# Patient Record
Sex: Female | Born: 1955 | Race: White | Hispanic: No | Smoking: Former smoker
Health system: Southern US, Community
[De-identification: ages and names within clinical notes are randomized; demographics above are authoritative.]

## PROBLEM LIST (undated history)

## (undated) DIAGNOSIS — I219 Acute myocardial infarction, unspecified: Secondary | ICD-10-CM

## (undated) DIAGNOSIS — I639 Cerebral infarction, unspecified: Secondary | ICD-10-CM

## (undated) DIAGNOSIS — J439 Emphysema, unspecified: Secondary | ICD-10-CM

## (undated) DIAGNOSIS — L821 Other seborrheic keratosis: Secondary | ICD-10-CM

## (undated) DIAGNOSIS — M8580 Other specified disorders of bone density and structure, unspecified site: Secondary | ICD-10-CM

## (undated) DIAGNOSIS — M1990 Unspecified osteoarthritis, unspecified site: Secondary | ICD-10-CM

## (undated) DIAGNOSIS — K219 Gastro-esophageal reflux disease without esophagitis: Secondary | ICD-10-CM

## (undated) DIAGNOSIS — C801 Malignant (primary) neoplasm, unspecified: Secondary | ICD-10-CM

## (undated) DIAGNOSIS — T7840XA Allergy, unspecified, initial encounter: Secondary | ICD-10-CM

## (undated) DIAGNOSIS — I209 Angina pectoris, unspecified: Secondary | ICD-10-CM

## (undated) DIAGNOSIS — H52209 Unspecified astigmatism, unspecified eye: Secondary | ICD-10-CM

## (undated) DIAGNOSIS — S82001A Unspecified fracture of right patella, initial encounter for closed fracture: Secondary | ICD-10-CM

## (undated) DIAGNOSIS — I10 Essential (primary) hypertension: Secondary | ICD-10-CM

## (undated) DIAGNOSIS — J381 Polyp of vocal cord and larynx: Secondary | ICD-10-CM

## (undated) HISTORY — PX: BREAST BIOPSY: SHX20

## (undated) HISTORY — DX: Other seborrheic keratosis: L82.1

## (undated) HISTORY — PX: SKIN BIOPSY: SHX1

## (undated) HISTORY — DX: Other specified disorders of bone density and structure, unspecified site: M85.80

## (undated) HISTORY — PX: BREAST SURGERY: SHX581

## (undated) HISTORY — PX: OTHER SURGICAL HISTORY: SHX169

## (undated) HISTORY — DX: Unspecified astigmatism, unspecified eye: H52.209

## (undated) HISTORY — DX: Unspecified fracture of right patella, initial encounter for closed fracture: S82.001A

## (undated) HISTORY — PX: TUBAL LIGATION: SHX77

## (undated) HISTORY — DX: Malignant (primary) neoplasm, unspecified: C80.1

## (undated) HISTORY — PX: CARDIAC CATHETERIZATION: SHX172

## (undated) HISTORY — DX: Emphysema, unspecified: J43.9

## (undated) HISTORY — DX: Gastro-esophageal reflux disease without esophagitis: K21.9

## (undated) HISTORY — DX: Allergy, unspecified, initial encounter: T78.40XA

## (undated) HISTORY — DX: Acute myocardial infarction, unspecified: I21.9

## (undated) SURGERY — Surgical Case
Anesthesia: *Unknown

---

## 1997-04-28 DIAGNOSIS — I219 Acute myocardial infarction, unspecified: Secondary | ICD-10-CM

## 1997-04-28 HISTORY — DX: Acute myocardial infarction, unspecified: I21.9

## 2015-01-24 ENCOUNTER — Encounter (HOSPITAL_COMMUNITY): Payer: Self-pay | Admitting: Emergency Medicine

## 2015-01-24 ENCOUNTER — Emergency Department (HOSPITAL_COMMUNITY)
Admission: EM | Admit: 2015-01-24 | Discharge: 2015-01-24 | Disposition: A | Discharge status: Home / Self Care | Payer: Self-pay | Attending: Emergency Medicine | Admitting: Emergency Medicine

## 2015-01-24 ENCOUNTER — Emergency Department (HOSPITAL_COMMUNITY): Payer: Self-pay

## 2015-01-24 DIAGNOSIS — W19XXXA Unspecified fall, initial encounter: Secondary | ICD-10-CM

## 2015-01-24 DIAGNOSIS — S4991XA Unspecified injury of right shoulder and upper arm, initial encounter: Secondary | ICD-10-CM | POA: Insufficient documentation

## 2015-01-24 DIAGNOSIS — S8001XA Contusion of right knee, initial encounter: Secondary | ICD-10-CM

## 2015-01-24 DIAGNOSIS — I10 Essential (primary) hypertension: Secondary | ICD-10-CM | POA: Insufficient documentation

## 2015-01-24 DIAGNOSIS — Y9301 Activity, walking, marching and hiking: Secondary | ICD-10-CM | POA: Insufficient documentation

## 2015-01-24 DIAGNOSIS — Y998 Other external cause status: Secondary | ICD-10-CM | POA: Insufficient documentation

## 2015-01-24 DIAGNOSIS — Y9289 Other specified places as the place of occurrence of the external cause: Secondary | ICD-10-CM | POA: Insufficient documentation

## 2015-01-24 DIAGNOSIS — W010XXA Fall on same level from slipping, tripping and stumbling without subsequent striking against object, initial encounter: Secondary | ICD-10-CM | POA: Insufficient documentation

## 2015-01-24 DIAGNOSIS — S5001XA Contusion of right elbow, initial encounter: Secondary | ICD-10-CM

## 2015-01-24 DIAGNOSIS — S6991XA Unspecified injury of right wrist, hand and finger(s), initial encounter: Secondary | ICD-10-CM | POA: Insufficient documentation

## 2015-01-24 DIAGNOSIS — Z7982 Long term (current) use of aspirin: Secondary | ICD-10-CM | POA: Insufficient documentation

## 2015-01-24 DIAGNOSIS — Z79899 Other long term (current) drug therapy: Secondary | ICD-10-CM | POA: Insufficient documentation

## 2015-01-24 DIAGNOSIS — S199XXA Unspecified injury of neck, initial encounter: Secondary | ICD-10-CM | POA: Insufficient documentation

## 2015-01-24 HISTORY — DX: Essential (primary) hypertension: I10

## 2015-01-24 MED ORDER — IBUPROFEN 200 MG PO TABS
600.0000 mg | ORAL_TABLET | Freq: Once | ORAL | Status: AC
  Administered 2015-01-24: 600 mg via ORAL
  Filled 2015-01-24: qty 3

## 2015-01-24 NOTE — ED Provider Notes (Signed)
CSN: [id number]     Arrival date & time 01/24/15  9371 History   First MD Initiated Contact with Patient 01/24/15 0756     Chief Complaint  Patient presents with  . Fall     (Consider location/radiation/quality/duration/timing/severity/associated sxs/prior Treatment) HPI  59 year old female presents after a fall yesterday. She states she was walking in her right knee "gave out". She states it felt like someone had pushed her from behind her knee. She has chronic knee issues from a patella fracture several years ago. However she states is never given out on her like this before. She fell and landed on her right side and has right shoulder pain, right elbow pain with swelling, and right lateral knee pain. Took Tylenol with some relief last night. Has been able to get up and walk. The patient denies hitting her head but her grandson, who is 2, stated that she hit her head. She does not remember this, and has no headache, nausea, vomiting, or dizziness. Swelling in her elbow has improved after ice. Denies any weakness or numbness.  Past Medical History  Diagnosis Date  . Hypertension    Past Surgical History  Procedure Laterality Date  . Caridac cath     No family history on file. Social History  Substance Use Topics  . Smoking status: None  . Smokeless tobacco: None  . Alcohol Use: None   OB History    No data available     Review of Systems  Gastrointestinal: Negative for nausea and vomiting.  Musculoskeletal: Positive for joint swelling and arthralgias.  Neurological: Negative for dizziness, syncope, weakness, numbness and headaches.  All other systems reviewed and are negative.     Allergies  Codeine  Home Medications   Prior to Admission medications   Medication Sig Start Date End Date Taking? Authorizing Provider  amLODipine (NORVASC) 10 MG tablet Take 10 mg by mouth daily.   Yes Historical Provider, MD  aspirin 325 MG tablet Take 325 mg by mouth daily.   Yes  Historical Provider, MD  losartan (COZAAR) 100 MG tablet Take 100 mg by mouth daily.   Yes Historical Provider, MD  Multiple Vitamins-Iron (MULTIVITAMIN/IRON PO) Take 1 tablet by mouth daily.   Yes Historical Provider, MD  verapamil (VERELAN PM) 180 MG 24 hr capsule Take 180 mg by mouth daily.   Yes Historical Provider, MD   BP 126/77 mmHg  Pulse 68  Temp(Src) 98.2 F (36.8 C) (Oral)  Resp 16  SpO2 100% Physical Exam  Constitutional: She is oriented to person, place, and time. She appears well-developed and well-nourished. No distress.  HENT:  Head: Normocephalic and atraumatic.  Right Ear: External ear normal.  Left Ear: External ear normal.  Nose: Nose normal.  Eyes: EOM are normal. Pupils are equal, round, and reactive to light. Right eye exhibits no discharge. Left eye exhibits no discharge.  Neck: Neck supple. Muscular tenderness present. No spinous process tenderness present.    Cardiovascular: Normal rate, regular rhythm and normal heart sounds.   Pulses:      Dorsalis pedis pulses are 2+ on the right side, and 2+ on the left side.  Pulmonary/Chest: Effort normal and breath sounds normal.  Abdominal: Soft. She exhibits no distension. There is no tenderness.  Musculoskeletal:       Right shoulder: She exhibits tenderness. She exhibits normal range of motion.       Right elbow: She exhibits swelling. She exhibits normal range of motion. Tenderness found.  Right wrist: She exhibits normal range of motion and no tenderness.       Right knee: She exhibits ecchymosis (mild lateral ecchymosis). She exhibits normal range of motion. Tenderness found. Lateral joint line tenderness noted.       Right upper arm: She exhibits no tenderness.       Right forearm: She exhibits no tenderness.       Right hand: She exhibits no tenderness.       Right upper leg: She exhibits no tenderness.       Right lower leg: She exhibits no tenderness.  Neurological: She is alert and oriented to  person, place, and time.  CN 2-12 grossly intact. 5/5 strength in all 4 extremities. Grossly normal sensation.  Skin: Skin is warm and dry. She is not diaphoretic.  Nursing note and vitals reviewed.   ED Course  Procedures (including critical care time) Labs Review Labs Reviewed - No data to display  Imaging Review Dg Shoulder Right  01/24/2015   CLINICAL DATA:  Status post fall 01/24/2015 with a right shoulder injury. Pain. Initial encounter.  EXAM: RIGHT SHOULDER - 2+ VIEW  COMPARISON:  None.  FINDINGS: No acute bony or joint abnormality is identified. Moderate acromioclavicular degenerative change is seen. Image right lung and ribs are unremarkable.  IMPRESSION: No acute finding.  Acromioclavicular osteoarthritis.   Electronically Signed   By: Inge Rise M.D.   On: 01/24/2015 08:55   Dg Elbow Complete Right  01/24/2015   CLINICAL DATA:  Status post fall.  Right elbow pain.  EXAM: RIGHT ELBOW - COMPLETE 3+ VIEW  COMPARISON:  None.  FINDINGS: There is no evidence of fracture, dislocation, or joint effusion. There is no evidence of arthropathy or other focal bone abnormality. Soft tissues are unremarkable.  IMPRESSION: No acute osseous injury of the right elbow.   Electronically Signed   By: Kathreen Devoid   On: 01/24/2015 08:55   Dg Knee Complete 4 Views Right  01/24/2015   CLINICAL DATA:  Status post fall following right knee giving out yesterday, persistent right lateral knee pain.  EXAM: RIGHT KNEE - COMPLETE 4+ VIEW  COMPARISON:  None in PACs  FINDINGS: The bones are adequately mineralized for age. The joint spaces are reasonably well-maintained. There is beaking of the tibial spines. There are spurs arising from the periphery of the lateral tibial plateau and lateral femoral condyles. The proximal fibula is intact. The patella is appropriately positioned. There is no joint effusion.  IMPRESSION: There is no acute bony abnormality of the right knee. There are mild osteoarthritic changes  present.   Electronically Signed   By: David  Martinique M.D.   On: 01/24/2015 08:56   I have personally reviewed and evaluated these images and lab results as part of my medical decision-making.   EKG Interpretation None      MDM   Final diagnoses:  Fall, initial encounter  Knee contusion, right, initial encounter  Elbow contusion, right, initial encounter    X-rays are unremarkable, no fractures noted. Mild contusions noted but otherwise patient's exam is normal. No signs of a significant head injury with a normal neuro exam and did not feel CT imaging is warranted even though she may have hit her head. Unclear why her knee gave out but she has normal strength, sensation, and normal pulses in both extremities. Plan to treat with ibuprofen, Tylenol, ice, and will refer to a PCP.    Sherwood Gambler, MD 01/24/15 [phone]

## 2015-01-24 NOTE — ED Notes (Addendum)
Pt reported that rt knee "gave out" and she fell yesterday. Pt reported that she does not remember hitting her head or LOC but 59yo grandson reported that she hit her head. Pt c/o rt shoulder, rt lateral knee and rt elbow pain s/p to fall. Denies dizziness, visual disturbances, headaches, and n/v. Rt arm with (+)PMS, CRT brisk, no deformity/swelling/bruising noted, LROM to rt shoulder but not elbow.  Rt leg with (+)PMS, CRT brisk, no deformity/swelling/bruising noted, ROM and able to bear weight.

## 2015-01-24 NOTE — ED Notes (Addendum)
Per pt, states she tripped and fell yesterday on her right side-c/o right leg, shoulder, and elbow pain

## 2015-01-24 NOTE — ED Notes (Signed)
Patient transported back from X-ray 

## 2015-01-24 NOTE — Discharge Instructions (Signed)
°Emergency Department Resource Guide °1) Find a Doctor and Pay Out of Pocket °Although you won't have to find out who is covered by your insurance plan, it is a good idea to ask around and get recommendations. You will then need to call the office and see if the doctor you have chosen will accept you as a new patient and what types of options they offer for patients who are self-pay. Some doctors offer discounts or will set up payment plans for their patients who do not have insurance, but you will need to ask so you aren't surprised when you get to your appointment. ° °2) Contact Your Local Health Department °Not all health departments have doctors that can see patients for sick visits, but many do, so it is worth a call to see if yours does. If you don't know where your local health department is, you can check in your phone book. The CDC also has a tool to help you locate your state's health department, and many state websites also have listings of all of their local health departments. ° °3) Find a Walk-in Clinic °If your illness is not likely to be very severe or complicated, you may want to try a walk in clinic. These are popping up all over the country in pharmacies, drugstores, and shopping centers. They're usually staffed by nurse practitioners or physician assistants that have been trained to treat common illnesses and complaints. They're usually fairly quick and inexpensive. However, if you have serious medical issues or chronic medical problems, these are probably not your best option. ° °No Primary Care Doctor: °- Call [facility] at  832-8000 - they can help you locate a primary care doctor that  accepts your insurance, provides certain services, etc. °- Physician Referral Service- 1-800-533-3463 ° °Chronic Pain Problems: °Organization         Address  Phone   Notes  °[facility]  (336) 297-2271 Patients need to be referred by their primary care doctor.  ° °Medication  Assistance: °Organization         Address  Phone   Notes  °[place] [facility] [address] °[place], [place] [address] (336) 641-8030 --Must be a resident of [place] °-- Must have NO insurance coverage whatsoever (no Medicaid/ Medicare, etc.) °-- The pt. MUST have a primary care doctor that directs their care regularly and follows them in the community °  °[facility]  (866) 331-1348   °[employer]  (888) 892-1162   ° °Agencies that provide inexpensive medical care: °Organization         Address  Phone   Notes  °[facility]  (336) 832-8035   °[facility]    (336) 832-7272   °[facility] [address] °[place], [place] [address] (336) 832-4777   °[facility] [address], °[place] (336) 271-4999   °[facility]    (336) 373-0678   °[facility]    (336) 272-1050   °[facility] ° [address], [place] Phone:  (336) 832-4444, Fax:  (336) 832-4440 Hours of Operation:  9 am - 6 pm, M-F.  Also accepts Medicaid/Medicare and self-pay.  °[facility] ° [address], [place] Phone: (336) 832-3150, Fax: (336) 832-3151. Hours of Operation:  8:30 am - 5:30 pm, M-F.  Also accepts Medicaid and self-pay.  °[facility] [place] [address], [place] Phone: (336) 878-6027   °[facility] [address], [place], [place] (336)723-1848, Ext. 123 Mondays & Thursdays: 7-9 AM.  First 15 patients are seen on a first come, first serve basis. °  ° °Medicaid-accepting [place] Providers: ° °Organization         Address  Phone   Notes  °[facility] [address], [place] (336) 641-2100 Also accepts self-pay patients.  °[facility] [address], [place] ° (336) 856-9996   °[facility] [address], [place]  (336) 288-8857   °[facility] [address] [place] [address], [place] (336) 299-7000   °Veita Bland [address], [place]  ° (336) 373-1557 Only accepts [place] Access Medicaid patients after they have their name applied to their card.  ° °Self-Pay (no insurance) in [place]: ° °Organization         Address  Phone   Notes  °Sickle Cell Patients, [facility] [address], [place] (336) 832-1970   °[facility] [address], [place] (336) 832-4400   ° [facility] ° [address], [place] (336) 992-4800   °[facility]/Dr. Osei-Bonsu ° [address] [place] [address], [place] or [address], [place] (336) [phone] Phone number for both [place] and [place] locations is the same.  °[facility] [address], [place] (336) 299-0000   °[facility] [address] [place] [address], [place] or [address] (336) 852-7530 °(336) 878-2260   °[facility] [address], [place] (336) 350-1642, phone; (336) 294-5005, fax Sees patients 1st and 3rd Saturday of every month.  Must not qualify for public or private insurance (i.e. Medicaid, Medicare, [place] Health Choice, Veterans' Benefits) • Household income should be no more than 200% of the poverty level •The clinic cannot treat you if you are pregnant or think you are pregnant • Sexually transmitted diseases are not treated at the clinic.  ° ° °Dental Care: °Organization         Address  Phone  Notes  °[facility] [facility] [address], [place] (336) 641-6152 Accepts children up to age 21 who are enrolled in Medicaid or [place] Health Choice; pregnant women with a Medicaid card; and children who have applied for Medicaid or [place] Health Choice, but were declined, whose parents can pay a reduced fee at time of service.  °[place]  [facility] [place]  [address], [place] (336) 641-7733 Accepts children up to age 21 who are enrolled in Medicaid or  Health Choice; pregnant women with a Medicaid card; and children who have applied for Medicaid or  Health Choice, but were declined, whose parents can pay a reduced fee at time of service.  °[facility] ° [address],  (336) 641-4533 Patients are seen by appointment only. Walk-ins are not accepted. [facility] will see patients 18 years of age and older. °Monday - Tuesday (8am-5pm) °Most Wednesdays (8:30-5pm) °$30 per visit, cash only  °[facility] ° [address], [place] (336) 641-4533 Patients are seen by appointment only. Walk-ins are not accepted. [facility] will see patients 18 years of age and older. °One   Wednesday Evening (Monthly: Volunteer Based).  $30 per visit, cash only  °[facility]  (919) 537-3737 for adults; Children under age 4, call Graduate Pediatric Dentistry at ([phone]) [phone]. Children aged 4-14, please call (919) [phone] to request a pediatric application. ° Dental services are provided in all areas of dental care including fillings, crowns and bridges, complete and partial dentures, implants, gum treatment, root canals, and extractions. Preventive care is also provided. Treatment is provided to both adults and children. °Patients are selected via a lottery and there is often a waiting list. °  °[facility] [address], °[place] ° (336) 763-8833 [email or web address] °  °[facility] [address], [place], [place] (336)723-1848, Ext. 123 Second and Fourth Thursday of each month, opens at 6:30 AM; Clinic ends at 9 AM.  Patients are seen on a first-come first-served basis, and a limited number are seen during each clinic.  ° °[facility] ° [address], [place], [place] (336) 723-7904    Eligibility Requirements °You must have lived in [place], [place], or [place] counties for at least the last three months. °  You cannot be eligible for state or federal sponsored healthcare insurance, including Veterans Administration, Medicaid, or Medicare. °  You generally cannot be eligible for healthcare insurance through your employer.  °  How to apply: °Eligibility screenings are held every Tuesday and Wednesday afternoon from 1:00 pm until 4:00 pm. You do not need an appointment for the interview!  °[facility] [address], [place], [place] 336-631-2330   °[facility]  336-342-8273   °[facility]  336-703-3100   °[facility]  336-570-6415   ° °Behavioral Health Resources in the Community: °Intensive Outpatient Programs °Organization         Address  Phone  Notes  °[facility] [address], [place], [place] 336-878-6098   °[facility] [address], [place], [place] 336-832-9800   °[facility] [address], [place], [place] ° 336-882-2125   °[facility] [address],  °[place], [place] 1-800-853-5163 or 336-641-4981   °Substance Abuse Resources °Organization         Address  Phone  Notes  °[facility]  336-882-2125   °[facility]  336-784-9470   °[facility]  336-285-9073   °[facility]  336-845-3988   °Residential & Outpatient Substance Abuse Program  1-800-659-3381   °Psychological Services °Organization         Address  Phone  Notes  °[facility]  336- 832-9600   °[facility]  336- 378-7881   °[facility] [address], [place] 1-800-853-5163 or 336-641-4981   ° °Mobile Crisis Teams °Organization         Address  Phone  Notes  °[facility], Mobile Crisis Care Unit  1-877-626-1772   °Assertive °[facility] ° [address]  [place], [place] 336-834-9664   °Sharon DeEsch [address] °[place]  336-554-5454   ° °Self-Help/Support Groups °Organization         Address  Phone             Notes  °[facility] - variety of support groups  336- 373-1402 Call for more information  °Narcotics Anonymous (NA), [facility] [address], °[place]   2 meetings at this location  ° °  Residential Treatment Programs °Organization         Address  Phone  Notes  °[facility] [address],    °[place]  1-866-801-8205   °[facility] ° [address], [place], [place] 704-293-8524   °[facility] [address], [place] 336-845-3988 Admissions: 8am-3pm M-F  °[facility] [address]    °[place], [place] 336-841-1104   °[facility] [address], [place], [place] 336-379-7146   °[facility] [address]  °[place], [place] 336-285-9073   °[facility] - Intensive Outpatient [address], [place], [place] 336-852-3033   °[facility] [address]  °[place], [place] 1-877-615-2722 or 336-784-9470   °[facility] [address] [place], [place] 336-227-7417 Accepts Medicaid  °[facility] [address]  °[place] 1-800-659-3381 Substance Abuse/Addiction Treatment  ° °[place] Behavioral Health Resources °Organization         Address  Phone  Notes  °[facility]  (888) 581-9988   °Julie Brannon, PhD [address] [place], [place]   (336) 349-5553 or (336) 951-0000   °[facility]   [address] °[place], [place] (336) 349-4454   °[facility] [address], [place], [place] (336) 342-8316 Insurance/Medicaid/sponsorship through [facility]  °[facility] [address]                                    [place], [place] (336) 342-8316 Therapy/tele-psych/case    °[facility] [address]  ° [place], [place] (336) 349-2233    °Dr. Arfeen  (336) 349-4544   °[facility] [place]  [employer] [place] [facility] 1) [address], [place] °2) [address], [place] °3)  [address], [place] (336) 349-3220 °(336) 342-7768 ° °(336) 342-8140   °[place] [facility] (336) 342-1394 or (336) 342-3537 (After Hours)    ° ° °

## 2015-04-26 ENCOUNTER — Emergency Department (HOSPITAL_COMMUNITY)
Admission: EM | Admit: 2015-04-26 | Discharge: 2015-04-26 | Disposition: A | Discharge status: Home / Self Care | Payer: Self-pay | Attending: Emergency Medicine | Admitting: Emergency Medicine

## 2015-04-26 ENCOUNTER — Encounter (HOSPITAL_COMMUNITY): Payer: Self-pay | Admitting: Emergency Medicine

## 2015-04-26 ENCOUNTER — Emergency Department (HOSPITAL_COMMUNITY): Payer: Self-pay

## 2015-04-26 DIAGNOSIS — I10 Essential (primary) hypertension: Secondary | ICD-10-CM | POA: Insufficient documentation

## 2015-04-26 DIAGNOSIS — Z7982 Long term (current) use of aspirin: Secondary | ICD-10-CM | POA: Insufficient documentation

## 2015-04-26 DIAGNOSIS — M5410 Radiculopathy, site unspecified: Secondary | ICD-10-CM | POA: Insufficient documentation

## 2015-04-26 DIAGNOSIS — F17210 Nicotine dependence, cigarettes, uncomplicated: Secondary | ICD-10-CM | POA: Insufficient documentation

## 2015-04-26 DIAGNOSIS — Z9889 Other specified postprocedural states: Secondary | ICD-10-CM | POA: Insufficient documentation

## 2015-04-26 DIAGNOSIS — I252 Old myocardial infarction: Secondary | ICD-10-CM | POA: Insufficient documentation

## 2015-04-26 DIAGNOSIS — Z79899 Other long term (current) drug therapy: Secondary | ICD-10-CM | POA: Insufficient documentation

## 2015-04-26 DIAGNOSIS — M792 Neuralgia and neuritis, unspecified: Secondary | ICD-10-CM

## 2015-04-26 DIAGNOSIS — R51 Headache: Secondary | ICD-10-CM | POA: Insufficient documentation

## 2015-04-26 DIAGNOSIS — R079 Chest pain, unspecified: Secondary | ICD-10-CM | POA: Insufficient documentation

## 2015-04-26 LAB — I-STAT TROPONIN, ED: Troponin i, poc: 0 ng/mL (ref 0.00–0.08)

## 2015-04-26 MED ORDER — KETOROLAC TROMETHAMINE 15 MG/ML IJ SOLN
15.0000 mg | Freq: Once | INTRAMUSCULAR | Status: AC
  Administered 2015-04-26: 15 mg via INTRAVENOUS
  Filled 2015-04-26: qty 1

## 2015-04-26 MED ORDER — PREDNISONE 10 MG PO TABS: ORAL_TABLET | ORAL | Status: DC

## 2015-04-26 MED ORDER — NAPROXEN 500 MG PO TABS: 500.0000 mg | ORAL_TABLET | Freq: Two times a day (BID) | ORAL | Status: DC

## 2015-04-26 NOTE — ED Notes (Signed)
Pt. In xray 

## 2015-04-26 NOTE — ED Notes (Signed)
Patient presents for left arm pain, radiating to shoulder, headache x2-3 days. Denies N/V, diaphoresis or visual changes. Rates pain 8/10.

## 2015-04-26 NOTE — ED Provider Notes (Signed)
CSN: [id number]     Arrival date & time 04/26/15  1839 History   First MD Initiated Contact with Patient 04/26/15 2106     Chief Complaint  Patient presents with  . Headache  . Arm Pain     (Consider location/radiation/quality/duration/timing/severity/associated sxs/prior Treatment) HPI Comments: Patient with several day history of occipital headache for 2-3 days, radiating towards scalp. Today developed left shoulder pain radiating into left arm. Pain in arm worse with movement.  Also had chest tightness around noon today that has resolved.  No prior neck injury.    Patient is a 59 y.o. female presenting with headaches and arm pain. The history is provided by the patient. No language interpreter was used.  Headache Pain location:  Occipital Quality:  Dull Radiates to:  L neck Severity currently:  8/10 Onset quality:  Gradual Duration:  3 days Timing:  Intermittent Progression:  Waxing and waning Chronicity:  New Similar to prior headaches: no   Associated symptoms: neck pain   Associated symptoms: no neck stiffness   Associated symptoms comment:  Left shoulder and arm pain Arm Pain Associated symptoms include headaches and neck pain. Pertinent negatives include no chest pain.    Past Medical History  Diagnosis Date  . Hypertension   . MI (myocardial infarction) [facility])    Past Surgical History  Procedure Laterality Date  . Caridac cath     No family history on file. Social History  Substance Use Topics  . Smoking status: Current Every Day Smoker -- 0.50 packs/day    Types: Cigarettes  . Smokeless tobacco: None  . Alcohol Use: Yes   OB History    No data available     Review of Systems  Respiratory: Positive for chest tightness.   Cardiovascular: Negative for chest pain.  Musculoskeletal: Positive for neck pain. Negative for neck stiffness.  Neurological: Positive for headaches.  All other systems reviewed and are negative.     Allergies  Codeine  Home  Medications   Prior to Admission medications   Medication Sig Start Date End Date Taking? Authorizing Provider  amLODipine (NORVASC) 10 MG tablet Take 10 mg by mouth daily.    Historical Provider, MD  aspirin 325 MG tablet Take 325 mg by mouth daily.    Historical Provider, MD  losartan (COZAAR) 100 MG tablet Take 100 mg by mouth daily.    Historical Provider, MD  Multiple Vitamins-Iron (MULTIVITAMIN/IRON PO) Take 1 tablet by mouth daily.    Historical Provider, MD  verapamil (VERELAN PM) 180 MG 24 hr capsule Take 180 mg by mouth daily.    Historical Provider, MD   BP 137/76 mmHg  Pulse 86  Temp(Src) 98.2 F (36.8 C) (Oral)  Resp 21  SpO2 95% Physical Exam  Constitutional: She is oriented to person, place, and time. She appears well-developed and well-nourished.  HENT:  Head: Normocephalic.  Eyes: Conjunctivae are normal.  Neck: Normal range of motion. Neck supple. Muscular tenderness present. No spinous process tenderness present. No rigidity.  Cardiovascular: Normal rate and regular rhythm.   Pulmonary/Chest: Effort normal and breath sounds normal.  Abdominal: Soft. Bowel sounds are normal.  Musculoskeletal: She exhibits tenderness. She exhibits no edema.  Neurological: She is alert and oriented to person, place, and time.  Skin: Skin is warm and dry.  Psychiatric: She has a normal mood and affect.  Nursing note and vitals reviewed.   ED Course  Procedures (including critical care time) Labs Review Labs Reviewed  Randolm Idol, ED  Imaging Review Ct Head Wo Contrast  04/26/2015  CLINICAL DATA:  LEFT arm pain radiating to shoulder in headache for 2-3 days. History of smoker, hypertension and myocardial infarction. EXAM: CT HEAD WITHOUT CONTRAST CT CERVICAL SPINE WITHOUT CONTRAST TECHNIQUE: Multidetector CT imaging of the head and cervical spine was performed following the standard protocol without intravenous contrast. Multiplanar CT image reconstructions of the  cervical spine were also generated. COMPARISON:  None. FINDINGS: CT HEAD FINDINGS The ventricles and sulci are normal. No intraparenchymal hemorrhage, mass effect nor midline shift. No acute large vascular territory infarcts. Mild patchy supratentorial white matter hypodensities most compatible with chronic small vessel ischemic disease. No abnormal extra-axial fluid collections. Basal cisterns are patent. Mild calcific atherosclerosis of the carotid siphons and vertebral arteries. No skull fracture. The included ocular globes and orbital contents are non-suspicious. The mastoid aircells and included paranasal sinuses are well-aerated. CT CERVICAL SPINE FINDINGS Cervical vertebral bodies and posterior elements intact. Straightened cervical lordosis. Moderate C3-4 disc height loss, moderate to severe at C5-6 and C6-7 with uncovertebral hypertrophy/endplate spurring compatible with degenerative discs. C2 subchondral cyst. Multilevel moderate to severe facet arthropathy. C1-2 articulation maintained with moderate arthropathy. No destructive bony lesions. Moderate calcific atherosclerosis the carotid bulbs can result in hemodynamically significant stenosis. Mild canal stenosis at C5-6. Severe LEFT C3-4, moderate to severe bilateral C5-6 and severe bilateral C6-7 neural foraminal narrowing. IMPRESSION: CT HEAD: No acute intracranial process. Mild chronic small vessel ischemic disease. CT CERVICAL SPINE: Straightened cervical lordosis without acute fracture nor malalignment. Degenerative cervical spine resulting in mild canal stenosis at C5-6. Severe LEFT C3-4, moderate to severe bilateral C5-6 and severe bilateral C6-7 neural foraminal narrowing. Electronically Signed   By: Elon Alas M.D.   On: 04/26/2015 22:38   Ct Cervical Spine Wo Contrast  04/26/2015  CLINICAL DATA:  LEFT arm pain radiating to shoulder in headache for 2-3 days. History of smoker, hypertension and myocardial infarction. EXAM: CT HEAD  WITHOUT CONTRAST CT CERVICAL SPINE WITHOUT CONTRAST TECHNIQUE: Multidetector CT imaging of the head and cervical spine was performed following the standard protocol without intravenous contrast. Multiplanar CT image reconstructions of the cervical spine were also generated. COMPARISON:  None. FINDINGS: CT HEAD FINDINGS The ventricles and sulci are normal. No intraparenchymal hemorrhage, mass effect nor midline shift. No acute large vascular territory infarcts. Mild patchy supratentorial white matter hypodensities most compatible with chronic small vessel ischemic disease. No abnormal extra-axial fluid collections. Basal cisterns are patent. Mild calcific atherosclerosis of the carotid siphons and vertebral arteries. No skull fracture. The included ocular globes and orbital contents are non-suspicious. The mastoid aircells and included paranasal sinuses are well-aerated. CT CERVICAL SPINE FINDINGS Cervical vertebral bodies and posterior elements intact. Straightened cervical lordosis. Moderate C3-4 disc height loss, moderate to severe at C5-6 and C6-7 with uncovertebral hypertrophy/endplate spurring compatible with degenerative discs. C2 subchondral cyst. Multilevel moderate to severe facet arthropathy. C1-2 articulation maintained with moderate arthropathy. No destructive bony lesions. Moderate calcific atherosclerosis the carotid bulbs can result in hemodynamically significant stenosis. Mild canal stenosis at C5-6. Severe LEFT C3-4, moderate to severe bilateral C5-6 and severe bilateral C6-7 neural foraminal narrowing. IMPRESSION: CT HEAD: No acute intracranial process. Mild chronic small vessel ischemic disease. CT CERVICAL SPINE: Straightened cervical lordosis without acute fracture nor malalignment. Degenerative cervical spine resulting in mild canal stenosis at C5-6. Severe LEFT C3-4, moderate to severe bilateral C5-6 and severe bilateral C6-7 neural foraminal narrowing. Electronically Signed   By: Elon Alas M.D.   On:  04/26/2015 22:38   I have personally reviewed and evaluated these images and lab results as part of my medical decision-making.   EKG Interpretation None     Radiology results reviewed and shared with patient. Pain likely radicular in nature. Will treat with anti-inflammatory and short course of steroids. Sling for arm support. Recommend follow-up with neurosurgery. MDM   Final diagnoses:  None    Radicular pain. Care instructions provided. Return precautions discussed.    Etta Quill, NP 04/26/15 [id number]  Varney Biles, MD 04/26/15 2348

## 2015-04-26 NOTE — Progress Notes (Signed)
EDCM spoke to patient at bedside. Patient confirms she does not have a pcp or insurance living in [place].  [facility] provided patient with contact infromation to [facility], informed patient of services there.  EDCM also provided patient with list of pcps who accept self pay patients, list of discount pharmacies and websites needymeds.org and GoodRX.com for medication assistance, phone number to inquire about the orange card, phone number to inquire about [employer], phone number to inquire about the [place], financial resources in the community such as local churches, [employer], urban ministries, and dental assistance for uninsured patients.  Patient thankful for resources.  No further EDCM needs at this time.  Patient reports she has applied for Medicaid.  Patient reports her son and daughter in law pay for her medications.

## 2015-04-26 NOTE — Discharge Instructions (Signed)
Radicular Pain °Radicular pain in either the arm or leg is usually from a bulging or herniated disk in the spine. A piece of the herniated disk may press against the nerves as the nerves exit the spine. This causes pain which is felt at the tips of the nerves down the arm or leg. Other causes of radicular pain may include: °· Fractures. °· Heart disease. °· Cancer. °· An abnormal and usually degenerative state of the nervous system or nerves (neuropathy). °Diagnosis may require CT or MRI scanning to determine the primary cause.  °Nerves that start at the neck (nerve roots) may cause radicular pain in the outer shoulder and arm. It can spread down to the thumb and fingers. The symptoms vary depending on which nerve root has been affected. In most cases radicular pain improves with conservative treatment. Neck problems may require physical therapy, a neck collar, or cervical traction. Treatment may take many weeks, and surgery may be considered if the symptoms do not improve.  °Conservative treatment is also recommended for sciatica. Sciatica causes pain to radiate from the lower back or buttock area down the leg into the foot. Often there is a history of back problems. Most patients with sciatica are better after 2 to 4 weeks of rest and other supportive care. Short term bed rest can reduce the disk pressure considerably. Sitting, however, is not a good position since this increases the pressure on the disk. You should avoid bending, lifting, and all other activities which make the problem worse. Traction can be used in severe cases. Surgery is usually reserved for patients who do not improve within the first months of treatment. °Only take over-the-counter or prescription medicines for pain, discomfort, or fever as directed by your caregiver. Narcotics and muscle relaxants may help by relieving more severe pain and spasm and by providing mild sedation. Cold or massage can give significant relief. Spinal manipulation  is not recommended. It can increase the degree of disc protrusion. Epidural steroid injections are often effective treatment for radicular pain. These injections deliver medicine to the spinal nerve in the space between the protective covering of the spinal cord and back bones (vertebrae). Your caregiver can give you more information about steroid injections. These injections are most effective when given within two weeks of the onset of pain.  °You should see your caregiver for follow up care as recommended. A program for neck and back injury rehabilitation with stretching and strengthening exercises is an important part of management.  °SEEK IMMEDIATE MEDICAL CARE IF: °· You develop increased pain, weakness, or numbness in your arm or leg. °· You develop difficulty with bladder or bowel control. °· You develop abdominal pain. °  °This information is not intended to replace advice given to you by your health care provider. Make sure you discuss any questions you have with your health care provider. °  °Document Released: 05/22/2004 Document Revised: 05/05/2014 Document Reviewed: 11/08/2014 °Elsevier Interactive Patient Education ©2016 [employer]vier Inc. ° °

## 2016-04-28 DIAGNOSIS — D229 Melanocytic nevi, unspecified: Secondary | ICD-10-CM

## 2016-04-28 HISTORY — DX: Melanocytic nevi, unspecified: D22.9

## 2016-05-26 ENCOUNTER — Ambulatory Visit (INDEPENDENT_AMBULATORY_CARE_PROVIDER_SITE_OTHER): Payer: BLUE CROSS/BLUE SHIELD | Admitting: Adult Health

## 2016-05-26 ENCOUNTER — Telehealth: Payer: Self-pay | Admitting: Adult Health

## 2016-05-26 ENCOUNTER — Encounter: Payer: Self-pay | Admitting: Adult Health

## 2016-05-26 VITALS — BP 122/78 | HR 79 | Ht 61.0 in | Wt 131.1 lb

## 2016-05-26 DIAGNOSIS — I252 Old myocardial infarction: Secondary | ICD-10-CM

## 2016-05-26 DIAGNOSIS — R200 Anesthesia of skin: Secondary | ICD-10-CM

## 2016-05-26 DIAGNOSIS — E784 Other hyperlipidemia: Secondary | ICD-10-CM | POA: Diagnosis not present

## 2016-05-26 DIAGNOSIS — Z833 Family history of diabetes mellitus: Secondary | ICD-10-CM | POA: Diagnosis not present

## 2016-05-26 DIAGNOSIS — F17200 Nicotine dependence, unspecified, uncomplicated: Secondary | ICD-10-CM | POA: Insufficient documentation

## 2016-05-26 DIAGNOSIS — E1159 Type 2 diabetes mellitus with other circulatory complications: Secondary | ICD-10-CM | POA: Diagnosis not present

## 2016-05-26 DIAGNOSIS — M25511 Pain in right shoulder: Secondary | ICD-10-CM

## 2016-05-26 DIAGNOSIS — I10 Essential (primary) hypertension: Secondary | ICD-10-CM

## 2016-05-26 DIAGNOSIS — E7849 Other hyperlipidemia: Secondary | ICD-10-CM | POA: Insufficient documentation

## 2016-05-26 DIAGNOSIS — R202 Paresthesia of skin: Secondary | ICD-10-CM

## 2016-05-26 DIAGNOSIS — R0609 Other forms of dyspnea: Secondary | ICD-10-CM

## 2016-05-26 DIAGNOSIS — R5383 Other fatigue: Secondary | ICD-10-CM | POA: Diagnosis not present

## 2016-05-26 DIAGNOSIS — D4989 Neoplasm of unspecified behavior of other specified sites: Secondary | ICD-10-CM

## 2016-05-26 DIAGNOSIS — G8929 Other chronic pain: Secondary | ICD-10-CM | POA: Insufficient documentation

## 2016-05-26 DIAGNOSIS — Z8673 Personal history of transient ischemic attack (TIA), and cerebral infarction without residual deficits: Secondary | ICD-10-CM | POA: Insufficient documentation

## 2016-05-26 DIAGNOSIS — R0602 Shortness of breath: Secondary | ICD-10-CM

## 2016-05-26 DIAGNOSIS — M25561 Pain in right knee: Secondary | ICD-10-CM

## 2016-05-26 MED ORDER — LOSARTAN POTASSIUM 100 MG PO TABS
100.0000 mg | ORAL_TABLET | Freq: Every day | ORAL | 0 refills | Status: DC
Start: 2016-05-26 — End: 2016-11-04

## 2016-05-26 MED ORDER — VERAPAMIL HCL ER 180 MG PO CP24: 180.0000 mg | ORAL_CAPSULE | Freq: Every day | ORAL | 0 refills | Status: DC

## 2016-05-26 MED ORDER — AMLODIPINE BESYLATE 10 MG PO TABS: 10.0000 mg | ORAL_TABLET | Freq: Every day | ORAL | 0 refills | Status: DC

## 2016-05-26 NOTE — Assessment & Plan Note (Signed)
Will monitor

## 2016-05-26 NOTE — Assessment & Plan Note (Signed)
Continue anti-hypertensives as directed.  Reduce tobacco use.

## 2016-05-26 NOTE — Assessment & Plan Note (Signed)
Continue to reduce tobacco use.  Will discuss smoking cessation at next encounter.

## 2016-05-26 NOTE — Assessment & Plan Note (Signed)
Continue current medications.  Continue to avoid sodium and saturated fat.

## 2016-05-26 NOTE — Assessment & Plan Note (Signed)
Avoid sugar and large amounts of CHO intake.

## 2016-05-26 NOTE — Progress Notes (Signed)
Subjective:    Patient ID: Heidi Foster, female    DOB: 1955-07-19, 61 y.o.   MRN: [id number]  HPI:  [name] presents to establish as new pt.  She has significant cardia/neurologic hx, and various musculoskeletal issues: 1999-MI:  PCA without stents, treated with medication. 2006-TIA:  Treated with medication.   She has not seen specialists in years and last contact with PCP was > 3 years ago. She denies current angina/palptiations. She has 30 pack year hx and experiences SOB everyday. She underwent extensive pulmonary testing last week for Disability benefits.  She is interested in smoking cessation.   She has chronic right knee pain, 8/10 localized over lateral knee.  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  X-rays revealed fx and Baker's Cyst.  She was treated with NSAIDs, various braces, and 2 cortisone injections.  Last contact with Orthopedic specialist was 2012.   She fell 3 weeks ago and suffered right shoulder injury, "while catching myself".  Currently 4/10, treating with OTC Salon Paws. Left foot toes/left hand fingers-intermittent numbness/tingling.  She denies previous neck/back injury prior to onset of sx's.  Reports "brown spot on nose has gotten really big over the last 3 years".   She is no working and lives at home with several family members.    No care team member to display  Patient Active Problem List   Diagnosis Date Noted  . Other fatigue 05/26/2016  . 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 Other hyperlipidemia 05/26/2016  . Family history of diabetes mellitus in mother 05/26/2016     Past Medical History:  Diagnosis Date  . Allergy   . Closed fracture of right patella   . COPD (chronic obstructive pulmonary disease) ([place])   . Heart attack 1999  . Hypertension   . MI (myocardial infarction)      Past Surgical History:  Procedure Laterality Date  . caridac cath       Family History  Problem Relation  Age of Onset  . Diabetes Mother   . Hypertension Mother   . Stroke Mother   . Alcohol abuse Father   . Multiple sclerosis Sister   . Cancer Brother     lung     History  Drug Use No     History  Alcohol Use No     History  Smoking Status  . Current Every Day Smoker  . Packs/day: 0.50  . Years: 40.00  . Types: Cigarettes  Smokeless Tobacco  . Never Used     Outpatient Encounter Prescriptions as of 05/26/2016  Medication Sig  . amLODipine (NORVASC) 10 MG tablet Take 1 tablet (10 mg total) by mouth daily.  Marland Kitchen aspirin EC 81 MG tablet Take 81 mg by mouth daily.  Marland Kitchen losartan (COZAAR) 100 MG tablet Take 1 tablet (100 mg total) by mouth daily.  . Multiple Vitamin (MULTIVITAMIN) tablet Take 1 tablet by mouth daily.  . verapamil (VERELAN PM) 180 MG 24 hr capsule Take 1 capsule (180 mg total) by mouth daily.  . [DISCONTINUED] amLODipine (NORVASC) 10 MG tablet Take 10 mg by mouth daily.  . [DISCONTINUED] losartan (COZAAR) 100 MG tablet Take 100 mg by mouth daily.  . [DISCONTINUED] verapamil (VERELAN PM) 180 MG 24 hr capsule Take 180 mg by mouth daily.  . [DISCONTINUED] naproxen (NAPROSYN) 500 MG tablet Take 1 tablet (500 mg total) by mouth 2 (two) times daily.  . [DISCONTINUED] predniSONE (DELTASONE) 10 MG  tablet Take 6 tabs day 1, 5 tabs day 2, 4 tabs day 3, 3 tabs day 4, 2 tabs day 5, 1 tab day 6.   No facility-administered encounter medications on file as of 05/26/2016.     Allergies: Codeine  Body mass index is 24.77 kg/m.  Blood pressure 122/78, pulse 79, height 5\' 1"  (1.549 m), weight 131 lb 1.6 oz (59.5 kg).     Review of Systems  Constitutional: Positive for fatigue. Negative for activity change, appetite change, chills, diaphoresis, fever and unexpected weight change.  HENT: Positive for trouble swallowing and voice change. Negative for congestion, postnasal drip and sore throat.        Reports hoarse voice "for as long as I can remember".  Nose-"large brown  spot that has gotten very big over the last couple of years".  Eyes: Positive for visual disturbance.       Hx of "floaters"...she doe not drive anymore due to this condition.  Respiratory: Positive for cough and shortness of breath. Negative for choking, chest tightness, wheezing and stridor.   Cardiovascular: Negative for chest pain, palpitations and leg swelling.  Gastrointestinal: Negative for abdominal pain, blood in stool, constipation and diarrhea.  Endocrine: Negative for cold intolerance, heat intolerance, polydipsia, polyphagia and polyuria.  Genitourinary: Negative for difficulty urinating.  Musculoskeletal: Positive for arthralgias, back pain, gait problem, joint swelling and myalgias. Negative for neck pain and neck stiffness.  Skin: Negative for color change, pallor, rash and wound.  Neurological: Negative for dizziness, tremors, seizures, syncope, speech difficulty, weakness, light-headedness and headaches.  Hematological: Does not bruise/bleed easily.  Psychiatric/Behavioral: Negative for agitation, behavioral problems and confusion. The patient is not nervous/anxious.        Objective:   Physical Exam  Constitutional: She appears well-developed and well-nourished. No distress.  HENT:  Head: Normocephalic and atraumatic.  Right Ear: External ear normal.  Left Ear: External ear normal.  Nose: No sinus tenderness.    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  Eyes: Conjunctivae and EOM are normal. Pupils are equal, round, and reactive to light.  Neck: Normal range of motion. Neck supple. No tracheal deviation present.  Cardiovascular: Normal rate, regular rhythm, normal heart sounds and intact distal pulses.   Pulmonary/Chest: Effort normal and breath sounds normal. No respiratory distress. She has no wheezes. She exhibits no tenderness.  Abdominal: Soft. She exhibits no distension.  Musculoskeletal: She exhibits no edema or deformity.       Right shoulder: She exhibits  tenderness, pain and decreased strength. She exhibits no bony tenderness, no swelling, no effusion, no crepitus, no spasm and normal pulse.       Right elbow: She exhibits normal range of motion and no swelling. No tenderness found.       Right hip: She exhibits decreased strength. She exhibits no tenderness.       Left hip: She exhibits normal strength and no tenderness.       Right knee: She exhibits decreased range of motion and bony tenderness. She exhibits no swelling, no effusion, no erythema, normal alignment, no LCL laxity and no MCL laxity. Tenderness found. Lateral joint line and patellar tendon tenderness noted.       Left knee: She exhibits normal range of motion, no swelling, no effusion, no LCL laxity, no bony tenderness and no MCL laxity. Tenderness found.  Stands with difficulty, then ambulates with limp on right side.  Holds onto furniture when standing.    Lymphadenopathy:    She  has no cervical adenopathy.  Neurological: She is alert.  Skin: Skin is warm and dry. She is not diaphoretic.  Psychiatric: She has a normal mood and affect. Her behavior is normal. Judgment and thought content normal.          Assessment & Plan:   1. Other fatigue   2. Hypertension associated with diabetes ([place])   3. Other hyperlipidemia   4. Family history of diabetes mellitus in mother   21. Chronic pain of right knee   6. Acute pain of right shoulder   7. Tobacco use disorder   8. Shortness of breath   9. History of MI (myocardial infarction)   10. History of TIA (transient ischemic attack)   11. Numbness and tingling of left lower extremity   12. 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  Continue to avoid sodium and saturated fat.  Other fatigue Increase water intake and eat healthy diet.  Perform regular exercises as tolerated and safe (i.e. Seated ROM exercises).  Other  hyperlipidemia Continue to avoid saturated fat intake.  Family history of diabetes mellitus in mother Avoid sugar and large amounts of CHO intake.  Acute pain of right shoulder Apply ice to shoulder for 20 mins. Several times daily.  OTC Acetaminophen per manufacturer's instructions.   Tobacco use disorder Continue to reduce tobacco use.  Will discuss smoking cessation at next encounter.  Shortness of breath Continue to reduce tobacco use.  Will discuss smoking cessation at next encounter.  Chronic pain of right knee Apply ice to knee for 20 mins several times daily.  OTC Acetaminophen as directed by manufacturer.  Use knee sleeve and cane as needed.  Will consider Orthopedic Specialist referral if sx's continue to worsen.  History of MI (myocardial infarction) Continue anti-hypertensives as directed.  Reduce tobacco use.  History of TIA (transient ischemic attack) Continue anti-hypertensives as directed.  Reduce tobacco use.  Numbness and tingling of left lower extremity Will monitor.  Numbness and tingling in left hand Will monitor.  Neoplasm of nose Dermatology referral placed.      FOLLOW-UP:  Return in about 1 month (around 06/25/2016) for HTN.

## 2016-05-26 NOTE — Assessment & Plan Note (Signed)
Apply ice to knee for 20 mins several times daily.  OTC Acetaminophen as directed by manufacturer.  Use knee sleeve and cane as needed.  Will consider Orthopedic Specialist referral if sx's continue to worsen.

## 2016-05-26 NOTE — Assessment & Plan Note (Signed)
Continue to avoid saturated fat intake.

## 2016-05-26 NOTE — Assessment & Plan Note (Signed)
Apply ice to shoulder for 20 mins. Several times daily.  OTC Acetaminophen per manufacturer's instructions.

## 2016-05-26 NOTE — Assessment & Plan Note (Signed)
Dermatology referral placed

## 2016-05-26 NOTE — Assessment & Plan Note (Signed)
Increase water intake and eat healthy diet.  Perform regular exercises as tolerated and safe (i.e. Seated ROM exercises).

## 2016-05-26 NOTE — Patient Instructions (Signed)
Knee Pain Knee pain is a very common symptom and can have many causes. Knee pain often goes away when you follow your health care provider's instructions for relieving pain and discomfort at home. However, knee pain can develop into a condition that needs treatment. Some conditions may include:  Arthritis caused by wear and tear (osteoarthritis).  Arthritis caused by swelling and irritation (rheumatoid arthritis or gout).  A cyst or growth in your knee.  An infection in your knee joint.  An injury that will not heal.  Damage, swelling, or irritation of the tissues that support your knee (torn ligaments or tendinitis). If your knee pain continues, additional tests may be ordered to diagnose your condition. Tests may include X-rays or other imaging studies of your knee. You may also need to have fluid removed from your knee. Treatment for ongoing knee pain depends on the cause, but treatment may include:  Medicines to relieve pain or swelling.  Steroid injections in your knee.  Physical therapy.  Surgery. HOME CARE INSTRUCTIONS  Take medicines only as directed by your health care provider.  Rest your knee and keep it raised (elevated) while you are resting.  Do not do things that cause or worsen pain.  Avoid high-impact activities or exercises, such as running, jumping rope, or doing jumping jacks.  Apply ice to the knee area:  Put ice in a plastic bag.  Place a towel between your skin and the bag.  Leave the ice on for 20 minutes, 2-3 times a day.  Ask your health care provider if you should wear an elastic knee support.  Keep a pillow under your knee when you sleep.  Lose weight if you are overweight. Extra weight can put pressure on your knee.  Do not use any tobacco products, including cigarettes, chewing tobacco, or electronic cigarettes. If you need help quitting, ask your health care provider. Smoking may slow the healing of any bone and joint problems that you may  have. SEEK MEDICAL CARE IF:  Your knee pain continues, changes, or gets worse.  You have a fever along with knee pain.  Your knee buckles or locks up.  Your knee becomes more swollen. SEEK IMMEDIATE MEDICAL CARE IF:   Your knee joint feels hot to the touch.  You have chest pain or trouble breathing. This information is not intended to replace advice given to you by your health care provider. Make sure you discuss any questions you have with your health care provider. Document Released: 02/09/2007 Document Revised: 05/05/2014 Document Reviewed: 11/28/2013 Elsevier Interactive Patient Education  2017 [employer]. Hypertension Hypertension is another name for high blood pressure. High blood pressure forces your heart to work harder to pump blood. A blood pressure reading has two numbers, which includes a higher number over a lower number (example: 110/72). Follow these instructions at home:  Have your blood pressure rechecked by your doctor.  Only take medicine as told by your doctor. Follow the directions carefully. The medicine does not work as well if you skip doses. Skipping doses also puts you at risk for problems.  Do not smoke.  Monitor your blood pressure at home as told by your doctor. Contact a doctor if:  You think you are having a reaction to the medicine you are taking.  You have repeat headaches or feel dizzy.  You have puffiness (swelling) in your ankles.  You have trouble with your vision. Get help right away if:  You get a very bad headache and  are confused.  You feel weak, numb, or faint.  You get chest or belly (abdominal) pain.  You throw up (vomit).  You cannot breathe very well. This information is not intended to replace advice given to you by your health care provider. Make sure you discuss any questions you have with your health care provider. Document Released: 10/01/2007 Document Revised: 09/20/2015 Document Reviewed: 02/04/2013 Elsevier  Interactive Patient Education  2017 [employer]   Shoulder Pain Many things can cause shoulder pain, including:  An injury.  Moving the arm in the same way again and again (overuse).  Joint pain (arthritis). Follow these instructions at home: Take these actions to help with your pain:  Squeeze a soft ball or a foam pad as much as you can. This helps to prevent swelling. It also makes the arm stronger.  Take over-the-counter and prescription medicines only as told by your doctor.  If told, put ice on the area:  Put ice in a plastic bag.  Place a towel between your skin and the bag.  Leave the ice on for 20 minutes, 2-3 times per day. Stop putting on ice if it does not help with the pain.  If you were given a shoulder sling or immobilizer:  Wear it as told.  Remove it to shower or bathe.  Move your arm as little as possible.  Keep your hand moving. This helps prevent swelling. Contact a doctor if:  Your pain gets worse.  Medicine does not help your pain.  You have new pain in your arm, hand, or fingers. Get help right away if:  Your arm, hand, or fingers:  Tingle.  Are numb.  Are swollen.  Are painful.  Turn white or blue. This information is not intended to replace advice given to you by your health care provider. Make sure you discuss any questions you have with your health care provider. Document Released: 10/01/2007 Document Revised: 12/09/2015 Document Reviewed: 08/07/2014 Elsevier Interactive Patient Education  2017 [employer].    Steps to Quit Smoking Smoking tobacco can be bad for your health. It can also affect almost every organ in your body. Smoking puts you and people around you at risk for many serious long-lasting (chronic) diseases. Quitting smoking is hard, but it is one of the best things that you can do for your health. It is never too late to quit. What are the benefits of quitting smoking? When you quit smoking, you lower your  risk for getting serious diseases and conditions. They can include:  Lung cancer or lung disease.  Heart disease.  Stroke.  Heart attack.  Not being able to have children (infertility).  Weak bones (osteoporosis) and broken bones (fractures). If you have coughing, wheezing, and shortness of breath, those symptoms may get better when you quit. You may also get sick less often. If you are pregnant, quitting smoking can help to lower your chances of having a baby of low birth weight. What can I do to help me quit smoking? Talk with your doctor about what can help you quit smoking. Some things you can do (strategies) include:  Quitting smoking totally, instead of slowly cutting back how much you smoke over a period of time.  Going to in-person counseling. You are more likely to quit if you go to many counseling sessions.  Using resources and support systems, such as:  Online chats with a Social worker.  Phone quitlines.  Printed Furniture conservator/restorer.  Support groups or group counseling.  Text  messaging programs.  Mobile phone apps or applications.  Taking medicines. Some of these medicines may have nicotine in them. If you are pregnant or breastfeeding, do not take any medicines to quit smoking unless your doctor says it is okay. Talk with your doctor about counseling or other things that can help you. Talk with your doctor about using more than one strategy at the same time, such as taking medicines while you are also going to in-person counseling. This can help make quitting easier. What things can I do to make it easier to quit? Quitting smoking might feel very hard at first, but there is a lot that you can do to make it easier. Take these steps:  Talk to your family and friends. Ask them to support and encourage you.  Call phone quitlines, reach out to support groups, or work with a Social worker.  Ask people who smoke to not smoke around you.  Avoid places that make you want  (trigger) to smoke, such as:  Bars.  Parties.  Smoke-break areas at work.  Spend time with people who do not smoke.  Lower the stress in your life. Stress can make you want to smoke. Try these things to help your stress:  Getting regular exercise.  Deep-breathing exercises.  Yoga.  Meditating.  Doing a body scan. To do this, close your eyes, focus on one area of your body at a time from head to toe, and notice which parts of your body are tense. Try to relax the muscles in those areas.  Download or buy apps on your mobile phone or tablet that can help you stick to your quit plan. There are many free apps, such as QuitGuide from the [employer] Office manager for Disease Control and Prevention). You can find more support from smokefree.gov and other websites. This information is not intended to replace advice given to you by your health care provider. Make sure you discuss any questions you have with your health care provider. Document Released: 02/08/2009 Document Revised: 12/11/2015 Document Reviewed: 08/29/2014 Elsevier Interactive Patient Education  2017 [employer].   Will call when labs result. Continue medications as directed. Continue to reduce number of cigarettes daily, will discuss smoking cessation at next encounter. Apply ice to areas of pain, OTC Acetaminophen (Tylenol) per manufacturer's directions.  Continue use of cane as needed.   Please return in 4 weeks, or sooner if needed.

## 2016-05-27 LAB — CBC WITH DIFFERENTIAL/PLATELET
Basophils Absolute: 0 10*3/uL (ref 0.0–0.2)
Basos: 0 %
EOS (ABSOLUTE): 0.1 10*3/uL (ref 0.0–0.4)
EOS: 1 %
HEMATOCRIT: 44 % (ref 34.0–46.6)
Hemoglobin: 14.7 g/dL (ref 11.1–15.9)
IMMATURE GRANS (ABS): 0 10*3/uL (ref 0.0–0.1)
Immature Granulocytes: 0 %
LYMPHS: 34 %
Lymphocytes Absolute: 2.5 10*3/uL (ref 0.7–3.1)
MCH: 30.1 pg (ref 26.6–33.0)
MCHC: 33.4 g/dL (ref 31.5–35.7)
MCV: 90 fL (ref 79–97)
Monocytes Absolute: 0.5 10*3/uL (ref 0.1–0.9)
Monocytes: 6 %
NEUTROS ABS: 4.5 10*3/uL (ref 1.4–7.0)
Neutrophils: 59 %
PLATELETS: 254 10*3/uL (ref 150–379)
RBC: 4.88 x10E6/uL (ref 3.77–5.28)
RDW: 14 % (ref 12.3–15.4)
WBC: 7.5 10*3/uL (ref 3.4–10.8)

## 2016-05-27 LAB — COMPREHENSIVE METABOLIC PANEL
ALK PHOS: 111 IU/L (ref 39–117)
ALT: 9 IU/L (ref 0–32)
AST: 15 IU/L (ref 0–40)
Albumin/Globulin Ratio: 2 (ref 1.2–2.2)
Albumin: 4.4 g/dL (ref 3.5–5.5)
BILIRUBIN TOTAL: 0.4 mg/dL (ref 0.0–1.2)
BUN/Creatinine Ratio: 31 — ABNORMAL HIGH (ref 9–23)
BUN: 15 mg/dL (ref 6–24)
CHLORIDE: 103 mmol/L (ref 96–106)
CO2: 23 mmol/L (ref 18–29)
Calcium: 9.9 mg/dL (ref 8.7–10.2)
Creatinine, Ser: 0.49 mg/dL — ABNORMAL LOW (ref 0.57–1.00)
GFR calc Af Amer: 123 mL/min/{1.73_m2} (ref >59)
GFR calc non Af Amer: 107 mL/min/{1.73_m2} (ref >59)
GLUCOSE: 89 mg/dL (ref 65–99)
Globulin, Total: 2.2 g/dL (ref 1.5–4.5)
Potassium: 4.2 mmol/L (ref 3.5–5.2)
Sodium: 142 mmol/L (ref 134–144)
Total Protein: 6.6 g/dL (ref 6.0–8.5)

## 2016-05-27 LAB — LIPID PANEL
CHOLESTEROL TOTAL: 179 mg/dL (ref 100–199)
Chol/HDL Ratio: 3.7 ratio units (ref 0.0–4.4)
HDL: 48 mg/dL (ref >39)
LDL Calculated: 111 mg/dL — ABNORMAL HIGH (ref 0–99)
TRIGLYCERIDES: 99 mg/dL (ref 0–149)
VLDL Cholesterol Cal: 20 mg/dL (ref 5–40)

## 2016-05-27 LAB — HEMOGLOBIN A1C
ESTIMATED AVERAGE GLUCOSE: 108 mg/dL
HEMOGLOBIN A1C: 5.4 % (ref 4.8–5.6)

## 2016-05-27 LAB — VITAMIN B12: VITAMIN B 12: 479 pg/mL (ref 232–1245)

## 2016-05-27 LAB — VITAMIN D 25 HYDROXY (VIT D DEFICIENCY, FRACTURES): Vit D, 25-Hydroxy: 33.6 ng/mL (ref 30.0–100.0)

## 2016-05-27 LAB — TSH: TSH: 0.928 u[IU]/mL (ref 0.450–4.500)

## 2016-06-23 ENCOUNTER — Telehealth: Payer: Self-pay | Admitting: Adult Health

## 2016-06-23 NOTE — Telephone Encounter (Signed)
Rcvd cll from Dr. Onalee Hua office stating patient has been seen,pls remove from referral [id number]

## 2016-06-25 ENCOUNTER — Ambulatory Visit: Payer: BLUE CROSS/BLUE SHIELD | Admitting: Adult Health

## 2016-06-25 ENCOUNTER — Ambulatory Visit (INDEPENDENT_AMBULATORY_CARE_PROVIDER_SITE_OTHER): Payer: BLUE CROSS/BLUE SHIELD | Admitting: Adult Health

## 2016-06-25 ENCOUNTER — Encounter: Payer: Self-pay | Admitting: Adult Health

## 2016-06-25 VITALS — BP 107/67 | HR 83 | Ht 61.0 in | Wt 137.8 lb

## 2016-06-25 DIAGNOSIS — I10 Essential (primary) hypertension: Secondary | ICD-10-CM | POA: Diagnosis not present

## 2016-06-25 DIAGNOSIS — Z1231 Encounter for screening mammogram for malignant neoplasm of breast: Secondary | ICD-10-CM

## 2016-06-25 DIAGNOSIS — Z23 Encounter for immunization: Secondary | ICD-10-CM

## 2016-06-25 DIAGNOSIS — F17200 Nicotine dependence, unspecified, uncomplicated: Secondary | ICD-10-CM

## 2016-06-25 DIAGNOSIS — R0609 Other forms of dyspnea: Secondary | ICD-10-CM | POA: Diagnosis not present

## 2016-06-25 DIAGNOSIS — Z1159 Encounter for screening for other viral diseases: Secondary | ICD-10-CM

## 2016-06-25 DIAGNOSIS — D4989 Neoplasm of unspecified behavior of other specified sites: Secondary | ICD-10-CM

## 2016-06-25 DIAGNOSIS — Z0000 Encounter for general adult medical examination without abnormal findings: Secondary | ICD-10-CM | POA: Insufficient documentation

## 2016-06-25 DIAGNOSIS — Z1239 Encounter for other screening for malignant neoplasm of breast: Secondary | ICD-10-CM

## 2016-06-25 MED ORDER — BUPROPION HCL ER (SR) 150 MG PO TB12: 150.0000 mg | ORAL_TABLET | Freq: Two times a day (BID) | ORAL | 1 refills | Status: DC

## 2016-06-25 NOTE — Assessment & Plan Note (Signed)
July 23, 2016-surgery to remove malignant neoplasm.

## 2016-06-25 NOTE — Assessment & Plan Note (Signed)
Started on Wellbutrin. Denies hx of depression/anxiety. Denies thoughts of harming herself/others.

## 2016-06-25 NOTE — Progress Notes (Signed)
Subjective:    Patient ID: Heidi Foster, female    DOB: [birth year], 61 y.o.   MRN: [id number]  HPI:  [name] is here for f/u for HTN.  She has been taking amlodipine/losartan/verapamil as directed and denies CP/fatigue/palpitations/orthosatic hypotension.  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 She also has new onset of "heavy breathing and I need to sleep propped up on pillows at night". She is interested in smoking cessation.  She denies hx of depression/anxity.  She denies thoughts of harming herself/others.  She has an excellent support system-lives with her adult son, his wife, and their 3 year ols son.     Patient Care Team    Relationship Specialty Notifications Start End  Odella Aquas, NP PCP - General Family Medicine  05/26/16     Patient Active Problem List   Diagnosis Date Noted  . Health care maintenance 06/25/2016  . Other fatigue 05/26/2016  . Hypertension 05/26/2016  . Other hyperlipidemia 05/26/2016  . Family history of diabetes mellitus in mother 05/26/2016  . Acute pain of right shoulder 05/26/2016  . Tobacco use disorder 05/26/2016  . Dyspnea on exertion 05/26/2016  . Chronic pain of right knee 05/26/2016  . History of MI (myocardial infarction) 05/26/2016  . History of TIA (transient ischemic attack) 05/26/2016  . Numbness and tingling of left lower extremity 05/26/2016  . Numbness and tingling in left hand 05/26/2016  . 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 Allergy   . Cancer ([place])    skin  . Closed fracture of right patella   . COPD (chronic obstructive pulmonary disease) ([place])   . Heart attack 1999  . Hypertension   . MI (myocardial infarction)   . Seborrheic keratoses      Past Surgical History:  Procedure Laterality Date  . caridac cath       Family History  Problem Relation Age of Onset  . Diabetes Mother   . Hypertension Mother    . Stroke Mother   . Alcohol abuse Father   . Multiple sclerosis Sister   . Cancer Brother     lung     History  Drug Use No     History  Alcohol Use No     History  Smoking Status  . Current Every Day Smoker  . Packs/day: 0.50  . Years: 40.00  . Types: Cigarettes  Smokeless Tobacco  . Never Used     Outpatient Encounter Prescriptions as of 06/25/2016  Medication Sig  . amLODipine (NORVASC) 10 MG tablet Take 1 tablet (10 mg total) by mouth daily.  Marland Kitchen aspirin EC 81 MG tablet Take 81 mg by mouth daily.  Marland Kitchen losartan (COZAAR) 100 MG tablet Take 1 tablet (100 mg total) by mouth daily.  . Multiple Vitamin (MULTIVITAMIN) tablet Take 1 tablet by mouth daily.  . verapamil (VERELAN PM) 180 MG 24 hr capsule Take 1 capsule (180 mg total) by mouth daily.  Marland Kitchen buPROPion (WELLBUTRIN SR) 150 MG 12 hr tablet Take 1 tablet (150 mg total) by mouth 2 (two) times daily. First 3 days only 1 tablet by mouth, then on day 4 increase to twice daily.   No facility-administered encounter medications on file as of 06/25/2016.     Allergies: Codeine  Body mass index is 26.04 kg/m.  Blood pressure 107/67, pulse 83, height 5\' 1"  (1.549 m),  weight 137 lb 12.8 oz (62.5 kg).     Review of Systems  Constitutional: Negative for activity change, appetite change, chills, diaphoresis, fatigue, fever and unexpected weight change.  HENT: Negative for congestion.   Eyes: Negative for visual disturbance.  Respiratory: Positive for cough and shortness of breath. Negative for apnea, choking, chest tightness, wheezing and stridor.   Cardiovascular: Negative for chest pain, palpitations and leg swelling.  Gastrointestinal: Negative for abdominal distention, abdominal pain, diarrhea, nausea and vomiting.  Endocrine: Negative for cold intolerance, heat intolerance, polydipsia, polyphagia and polyuria.  Genitourinary: Negative for difficulty urinating and flank pain.  Musculoskeletal: Positive for arthralgias,  back pain, gait problem, joint swelling, myalgias, neck pain and neck stiffness.  Skin: Negative for color change, pallor, rash and wound.  Neurological: Negative for dizziness, tremors and weakness.  Psychiatric/Behavioral: Negative for agitation, behavioral problems, decreased concentration, self-injury, sleep disturbance and suicidal ideas. The patient is not nervous/anxious.        Objective:   Physical Exam  Constitutional: She is oriented to person, place, and time. She appears well-developed and well-nourished. No distress.  HENT:  Head: Normocephalic and atraumatic.  Eyes: Conjunctivae and EOM are normal. Pupils are equal, round, and reactive to light.  Neck: Normal range of motion. Neck supple.  Cardiovascular: Normal rate, regular rhythm, normal heart sounds and intact distal pulses.   Pulmonary/Chest: Effort normal. No tachypnea and no bradypnea. No respiratory distress. She has decreased breath sounds in the right lower field and the left lower field. She has no wheezes. She has no rales. She exhibits no tenderness.  Lymphadenopathy:    She has no cervical adenopathy.  Neurological: She is alert and oriented to person, place, and time.  Skin: Skin is warm and dry. She is not diaphoretic.  Psychiatric: She has a normal mood and affect. Her behavior is normal. Judgment and thought content normal.          Assessment & Plan:   1. Need for Tdap vaccination   2. Need for pneumococcal vaccination   3. Need for hepatitis C screening test   4. Screening for breast cancer   5. Dyspnea on exertion   6. Essential hypertension   7. Neoplasm of nose   8. Tobacco use disorder   9. Health care maintenance     Hypertension Continue on Amlodipine/Losartan/Verapamil as directed. Denies CP/orthrostatic hypotension/fatigue.  Dyspnea on exertion Started on Wellbutrin for smoking cessation. Pulmonary referral placed.  Neoplasm of nose July 23, 2016-surgery to remove malignant  neoplasm.  Tobacco use disorder Started on Wellbutrin. Denies hx of depression/anxiety. Denies thoughts of harming herself/others.   Health care maintenance Return in 3 months for regular f/u, also needs a PAP.    FOLLOW-UP:  Return in about 3 months (around 09/22/2016) for Regular Follow Up, HTN.

## 2016-06-25 NOTE — Assessment & Plan Note (Signed)
Return in 3 months for regular f/u, also needs a PAP.

## 2016-06-25 NOTE — Patient Instructions (Signed)
Hypertension Hypertension, commonly called high blood pressure, is when the force of blood pumping through the arteries is too strong. The arteries are the blood vessels that carry blood from the heart throughout the body. Hypertension forces the heart to work harder to pump blood and may cause arteries to become narrow or stiff. Having untreated or uncontrolled hypertension can cause heart attacks, strokes, kidney disease, and other problems. A blood pressure reading consists of a higher number over a lower number. Ideally, your blood pressure should be below 120/80. The first ("top") number is called the systolic pressure. It is a measure of the pressure in your arteries as your heart beats. The second ("bottom") number is called the diastolic pressure. It is a measure of the pressure in your arteries as the heart relaxes. What are the causes? The cause of this condition is not known. What increases the risk? Some risk factors for high blood pressure are under your control. Others are not. Factors you can change   Smoking.  Having type 2 diabetes mellitus, high cholesterol, or both.  Not getting enough exercise or physical activity.  Being overweight.  Having too much fat, sugar, calories, or salt (sodium) in your diet.  Drinking too much alcohol. Factors that are difficult or impossible to change   Having chronic kidney disease.  Having a family history of high blood pressure.  Age. Risk increases with age.  Race. You may be at higher risk if you are African-American.  Gender. Men are at higher risk than women before age 45. After age 65, women are at higher risk than men.  Having obstructive sleep apnea.  Stress. What are the signs or symptoms? Extremely high blood pressure (hypertensive crisis) may cause:  Headache.  Anxiety.  Shortness of breath.  Nosebleed.  Nausea and vomiting.  Severe chest pain.  Jerky movements you cannot control (seizures). How is this  diagnosed? This condition is diagnosed by measuring your blood pressure while you are seated, with your arm resting on a surface. The cuff of the blood pressure monitor will be placed directly against the skin of your upper arm at the level of your heart. It should be measured at least twice using the same arm. Certain conditions can cause a difference in blood pressure between your right and left arms. Certain factors can cause blood pressure readings to be lower or higher than normal (elevated) for a short period of time:  When your blood pressure is higher when you are in a health care provider's office than when you are at home, this is called white coat hypertension. Most people with this condition do not need medicines.  When your blood pressure is higher at home than when you are in a health care provider's office, this is called masked hypertension. Most people with this condition may need medicines to control blood pressure. If you have a high blood pressure reading during one visit or you have normal blood pressure with other risk factors:  You may be asked to return on a different day to have your blood pressure checked again.  You may be asked to monitor your blood pressure at home for 1 week or longer. If you are diagnosed with hypertension, you may have other blood or imaging tests to help your health care provider understand your overall risk for other conditions. How is this treated? This condition is treated by making healthy lifestyle changes, such as eating healthy foods, exercising more, and reducing your alcohol intake. Your health   care provider may prescribe medicine if lifestyle changes are not enough to get your blood pressure under control, and if:  Your systolic blood pressure is above 130.  Your diastolic blood pressure is above 80. Your personal target blood pressure may vary depending on your medical conditions, your age, and other factors. Follow these instructions  at home: Eating and drinking   Eat a diet that is high in fiber and potassium, and low in sodium, added sugar, and fat. An example eating plan is called the DASH (Dietary Approaches to Stop Hypertension) diet. To eat this way:  Eat plenty of fresh fruits and vegetables. Try to fill half of your plate at each meal with fruits and vegetables.  Eat whole grains, such as whole wheat pasta, brown rice, or whole grain bread. Fill about one quarter of your plate with whole grains.  Eat or drink low-fat dairy products, such as skim milk or low-fat yogurt.  Avoid fatty cuts of meat, processed or cured meats, and poultry with skin. Fill about one quarter of your plate with lean proteins, such as fish, chicken without skin, beans, eggs, and tofu.  Avoid premade and processed foods. These tend to be higher in sodium, added sugar, and fat.  Reduce your daily sodium intake. Most people with hypertension should eat less than 1,500 mg of sodium a day.  Limit alcohol intake to no more than 1 drink a day for nonpregnant women and 2 drinks a day for men. One drink equals 12 oz of beer, 5 oz of wine, or 1 oz of hard liquor. Lifestyle   Work with your health care provider to maintain a healthy body weight or to lose weight. Ask what an ideal weight is for you.  Get at least 30 minutes of exercise that causes your heart to beat faster (aerobic exercise) most days of the week. Activities may include walking, swimming, or biking.  Include exercise to strengthen your muscles (resistance exercise), such as pilates or lifting weights, as part of your weekly exercise routine. Try to do these types of exercises for 30 minutes at least 3 days a week.  Do not use any products that contain nicotine or tobacco, such as cigarettes and e-cigarettes. If you need help quitting, ask your health care provider.  Monitor your blood pressure at home as told by your health care provider.  Keep all follow-up visits as told by  your health care provider. This is important. Medicines   Take over-the-counter and prescription medicines only as told by your health care provider. Follow directions carefully. Blood pressure medicines must be taken as prescribed.  Do not skip doses of blood pressure medicine. Doing this puts you at risk for problems and can make the medicine less effective.  Ask your health care provider about side effects or reactions to medicines that you should watch for. Contact a health care provider if:  You think you are having a reaction to a medicine you are taking.  You have headaches that keep coming back (recurring).  You feel dizzy.  You have swelling in your ankles.  You have trouble with your vision. Get help right away if:  You develop a severe headache or confusion.  You have unusual weakness or numbness.  You feel faint.  You have severe pain in your chest or abdomen.  You vomit repeatedly.  You have trouble breathing. Summary  Hypertension is when the force of blood pumping through your arteries is too strong. If this condition is   not controlled, it may put you at risk for serious complications.  Your personal target blood pressure may vary depending on your medical conditions, your age, and other factors. For most people, a normal blood pressure is less than 120/80.  Hypertension is treated with lifestyle changes, medicines, or a combination of both. Lifestyle changes include weight loss, eating a healthy, low-sodium diet, exercising more, and limiting alcohol. This information is not intended to replace advice given to you by your health care provider. Make sure you discuss any questions you have with your health care provider. Document Released: 04/14/2005 Document Revised: 03/12/2016 Document Reviewed: 03/12/2016 Elsevier Interactive Patient Education  2017 [employer].   Smoking Tobacco Information Smoking tobacco will very likely harm your health. Tobacco  contains a poisonous (toxic), colorless chemical called nicotine. Nicotine affects the brain and makes tobacco addictive. This change in your brain can make it hard to stop smoking. Tobacco also has other toxic chemicals that can hurt your body and raise your risk of many cancers. How can smoking tobacco affect me? Smoking tobacco can increase your chances of having serious health conditions, such as:  Cancer. Smoking is most commonly associated with lung cancer, but can lead to cancer in other parts of the body.  Chronic obstructive pulmonary disease (COPD). This is a long-term lung condition that makes it hard to breathe. It also gets worse over time.  High blood pressure (hypertension), heart disease, stroke, or heart attack.  Lung infections, such as pneumonia.  Cataracts. This is when the lenses in the eyes become clouded.  Digestive problems. This may include peptic ulcers, heartburn, and gastroesophageal reflux disease (GERD).  Oral health problems, such as gum disease and tooth loss.  Loss of taste and smell. Smoking can affect your appearance by causing:  Wrinkles.  Yellow or stained teeth, fingers, and fingernails. Smoking tobacco can also affect your social life.  Many workplaces, [employer], hotels, and public places are tobacco-free. This means that you may experience challenges in finding places to smoke when away from home.  The cost of a smoking habit can be expensive. Expenses for someone who smokes come in two ways:  You spend money on a regular basis to buy tobacco.  Your health care costs in the long-term are higher if you smoke.  Tobacco smoke can also affect the health of those around you. Children of smokers have greater chances of:  Sudden infant death syndrome (SIDS).  Ear infections.  Lung infections. What lifestyle changes can be made?  Do not start smoking. Quit if you already do.  To quit smoking:  Make a plan to quit smoking and commit  yourself to it. Look for programs to help you and ask your health care provider for recommendations and ideas.  Talk with your health care provider about using nicotine replacement medicines to help you quit. Medicine replacement medicines include gum, lozenges, patches, sprays, or pills.  Do not replace cigarette smoking with electronic cigarettes, which are commonly called e-cigarettes. The safety of e-cigarettes is not known, and some may contain harmful chemicals.  Avoid places, people, or situations that tempt you to smoke.  If you try to quit but return to smoking, don't give up hope. It is very common for people to try a number of times before they fully succeed. When you feel ready again, give it another try.  Quitting smoking might affect the way you eat as well as your weight. Be prepared to monitor your eating habits. Get support in  planning and following a healthy diet.  Ask your health care provider about having regular tests (screenings) to check for cancer. This may include blood tests, imaging tests, and other tests.  Exercise regularly. Consider taking walks, joining a gym, or doing yoga or exercise classes.  Develop skills to manage your stress. These skills include meditation. What are the benefits of quitting smoking? By quitting smoking, you may:  Lower your risk of getting cancer and other diseases caused by smoking.  Live longer.  Breathe better.  Lower your blood pressure and heart rate.  Stop your addiction to tobacco.  Stop creating secondhand smoke that hurts other people.  Improve your sense of taste and smell.  Look better over time, due to having fewer wrinkles and less staining. What can happen if changes are not made? If you do not stop smoking, you may:  Get cancer and other diseases.  Develop COPD or other long-term (chronic) lung conditions.  Develop serious problems with your heart and blood vessels (cardiovascular system).  Need more  tests to screen for problems caused by smoking.  Have higher, long-term healthcare costs from medicines or treatments related to smoking.  Continue to have worsening changes in your lungs, mouth, and nose. Where to find support: To get support to quit smoking, consider:  Asking your health care provider for more information and resources.  Taking classes to learn more about quitting smoking.  Looking for local organizations that offer resources about quitting smoking.  Joining a support group for people who want to quit smoking in your local community. Where to find more information: You may find more information about quitting smoking from:  HelpGuide.org: www.helpguide.org/articles/addictions/how-to-quit-smoking.htm  https://hall.com/: smokefree.gov  American Lung Association: www.lung.org Contact a health care provider if:  You have problems breathing.  Your lips, nose, or fingers turn blue.  You have chest pain.  You are coughing up blood.  You feel faint or you pass out.  You have other noticeable changes that cause you to worry. Summary  Smoking tobacco can negatively affect your health, the health of those around you, your finances, and your social life.  Do not start smoking. Quit if you already do. If you need help quitting, ask your health care provider.  Think about joining a support group for people who want to quit smoking in your local community. There are many effective programs that will help you to quit this behavior. This information is not intended to replace advice given to you by your health care provider. Make sure you discuss any questions you have with your health care provider. Document Released: 04/29/2016 Document Revised: 04/29/2016 Document Reviewed: 04/29/2016 Elsevier Interactive Patient Education  2017 [place] blood pressure medications as directed. Start Wellbutrin, only 1 tablet daily for first three days, then twice daily  on day four.  Continue twice daily until advised to stop. Pulmonology referral placed. Follow-up in 3 months-PAP needed. Please call clinic with any questions/concerns.

## 2016-06-25 NOTE — Assessment & Plan Note (Addendum)
Continue on Amlodipine/Losartan/Verapamil as directed. Denies CP/orthrostatic hypotension/fatigue.

## 2016-06-25 NOTE — Assessment & Plan Note (Signed)
Started on Wellbutrin for smoking cessation. Pulmonary referral placed.

## 2016-06-26 ENCOUNTER — Other Ambulatory Visit: Payer: Self-pay | Admitting: Adult Health

## 2016-06-26 DIAGNOSIS — I10 Essential (primary) hypertension: Secondary | ICD-10-CM

## 2016-06-26 LAB — HEPATITIS C ANTIBODY

## 2016-06-26 MED ORDER — AMLODIPINE BESYLATE 2.5 MG PO TABS: 2.5000 mg | ORAL_TABLET | Freq: Every day | ORAL | 2 refills | Status: DC

## 2016-06-26 NOTE — Progress Notes (Unsigned)
Decreased Amlodipine dosage from 10mg  to 2.5mg . Continued Verapamil 180mg  and Losartan 100mg  daily.

## 2016-06-27 NOTE — Telephone Encounter (Signed)
error 

## 2016-06-30 ENCOUNTER — Ambulatory Visit (INDEPENDENT_AMBULATORY_CARE_PROVIDER_SITE_OTHER): Payer: BLUE CROSS/BLUE SHIELD | Admitting: Pulmonary Disease

## 2016-06-30 ENCOUNTER — Ambulatory Visit (INDEPENDENT_AMBULATORY_CARE_PROVIDER_SITE_OTHER)
Admission: RE | Admit: 2016-06-30 | Discharge: 2016-06-30 | Disposition: A | Discharge status: Home / Self Care | Payer: BLUE CROSS/BLUE SHIELD | Source: Ambulatory Visit | Attending: Pulmonary Disease | Admitting: Pulmonary Disease

## 2016-06-30 ENCOUNTER — Telehealth: Payer: Self-pay | Admitting: Pulmonary Disease

## 2016-06-30 ENCOUNTER — Encounter: Payer: Self-pay | Admitting: Pulmonary Disease

## 2016-06-30 VITALS — BP 120/80 | HR 81 | Ht 61.0 in | Wt 134.2 lb

## 2016-06-30 DIAGNOSIS — I2510 Atherosclerotic heart disease of native coronary artery without angina pectoris: Secondary | ICD-10-CM | POA: Diagnosis not present

## 2016-06-30 DIAGNOSIS — R1310 Dysphagia, unspecified: Secondary | ICD-10-CM

## 2016-06-30 DIAGNOSIS — R0609 Other forms of dyspnea: Secondary | ICD-10-CM

## 2016-06-30 DIAGNOSIS — K219 Gastro-esophageal reflux disease without esophagitis: Secondary | ICD-10-CM | POA: Insufficient documentation

## 2016-06-30 DIAGNOSIS — F17200 Nicotine dependence, unspecified, uncomplicated: Secondary | ICD-10-CM | POA: Diagnosis not present

## 2016-06-30 MED ORDER — ALBUTEROL SULFATE HFA 108 (90 BASE) MCG/ACT IN AERS: 2.0000 | INHALATION_SPRAY | RESPIRATORY_TRACT | 3 refills | Status: AC | PRN

## 2016-06-30 MED ORDER — AEROCHAMBER MV MISC: 0 refills | Status: AC

## 2016-06-30 MED ORDER — RANITIDINE HCL 150 MG PO TABS: 150.0000 mg | ORAL_TABLET | Freq: Every day | ORAL | 3 refills | Status: DC

## 2016-06-30 NOTE — Addendum Note (Signed)
Addended by: Tyson Dense on: 06/30/2016 10:14 AM   Modules accepted: Orders

## 2016-06-30 NOTE — Progress Notes (Signed)
Subjective:    Patient ID: Heidi Foster, female    DOB: 1955-09-19, 61 y.o.   MRN: [id number]  HPI She reports she has been prescribed Wellbutrin by another physician and plans on starting it today. She denies any breathing problems, allergies, or asthma as a child. She reports she has had remote pneumonia. 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 She reports she has noticed dyspnea for about 3 years that seems to be progressively worsening. Her dyspnea is worse on exertion, especially carrying groceries. She does have to rest at times to help her dyspnea. Does report some "choking" and coughing when exposed to fumes or inhaled chemicals. She reports a significant chronic cough that is intermittently productive of a "yellow-green" phlegm. Denies any hemoptysis. She reports she cannot climb the 8 steps into her home before stopping to catch her breath. She does wheeze frequently. She reports chest tightness, primarily with dyspnea. She does have chest pain at times and feels like "someone is sitting" on her chest. She reports she sleeps on 3 pillows because she cannot lay flat. She denies any lower extremity edema. She reports she does have intermittent reflux depending on her diet. She takes Tums intermittently. She does have morning brash water taste. She does have dysphagia at times. She denies any sinus congestion, pressure or drainage. She does report a constantly hoarse voice.   Review of Systems No rashes or abnormal bruising. No persistent headaches or focal weakness or numbness. She does have tingling in her feet bilaterally for the last 10 months. A pertinent 14 point review of systems is negative except as per the history of presenting illness.  Allergies  Allergen Reactions  . Codeine Anaphylaxis    Current Outpatient Prescriptions on File Prior to Visit  Medication Sig Dispense Refill  .  amLODipine (NORVASC) 2.5 MG tablet Take 1 tablet (2.5 mg total) by mouth daily. Stop taking Amlodipine 10mg . 90 tablet 2  . aspirin EC 81 MG tablet Take 81 mg by mouth daily.    Marland Kitchen losartan (COZAAR) 100 MG tablet Take 1 tablet (100 mg total) by mouth daily. 90 tablet 0  . Multiple Vitamin (MULTIVITAMIN) tablet Take 1 tablet by mouth daily.    . verapamil (VERELAN PM) 180 MG 24 hr capsule Take 1 capsule (180 mg total) by mouth daily. 90 capsule 0  . buPROPion (WELLBUTRIN SR) 150 MG 12 hr tablet Take 1 tablet (150 mg total) by mouth 2 (two) times daily. First 3 days only 1 tablet by mouth, then on day 4 increase to twice daily. (Patient not taking: Reported on 06/30/2016) 90 tablet 1   No current facility-administered medications on file prior to visit.     Past Medical History:  Diagnosis Date  . Allergy   . Cancer ([place])    skin  . Closed fracture of right patella   . GERD (gastroesophageal reflux disease)   . Heart attack 1999  . Hypertension   . MI (myocardial infarction)    [place] 2001 w/o stent  . Seborrheic keratoses     Past Surgical History:  Procedure Laterality Date  . caridac cath      Family History  Problem Relation Age of Onset  . Diabetes Mother   . Hypertension Mother   . Stroke Mother   . Alcohol abuse Father   . Multiple sclerosis Sister   . Cancer Brother  lung  . Stroke Maternal Grandmother   . Heart attack Maternal Grandmother     Social History   Social History  . Marital status: Divorced    Spouse name: N/A  . Number of children: N/A  . Years of education: N/A   Social History Main Topics  . Smoking status: Current Every Day Smoker    Packs/day: 0.50    Years: 40.00    Types: Cigarettes    Start date: 09/27/1976  . Smokeless tobacco: Never Used     Comment: Peak rate of 1ppd  . Alcohol use No  . Drug use: No  . Sexual activity: No   Other Topics Concern  . None   Social History Narrative   [facility] (06/30/16):   Originally from  [place]. Moved to [facility] in April 2016. 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 Currently lives with her daughter & son-in-law in a mobile home. Has a cat currently. No mold or TB exposure. Always had a negative skin PPD. Previously enjoyed crocheting.       Objective:   Physical Exam BP 120/80 (BP Location: Left Arm, Patient Position: Sitting, Cuff Size: Normal)   Pulse 81   Ht 5\' 1"  (1.549 m)   Wt 134 lb 3.2 oz (60.9 kg)   SpO2 97%   BMI 25.36 kg/m  General:  Awake. Alert. No acute distress.   Integument:  Warm & dry. No rash on exposed skin. No bruising. Extremities:  No cyanosis or clubbing.  Lymphatics:  No appreciated cervical or supraclavicular lymphadenoapthy. HEENT:  Moist mucus membranes. No oral ulcers. No scleral injection or icterus.  Cardiovascular:  Regular rate. No edema. No appreciable JVD.  Pulmonary:  Good aeration & clear to auscultation bilaterally. Symmetric chest wall expansion. No accessory muscle use. Abdomen: Soft. Normal bowel sounds. Nondistended. Grossly nontender. Musculoskeletal:  Normal bulk and tone. Hand grip strength 5/5 bilaterally. No joint deformity or effusion appreciated. Neurological:  CN 2-12 grossly in tact. No meningismus. Moving all 4 extremities equally. Symmetric brachioradialis deep tendon reflexes. Psychiatric:  Mood and affect congruent. Speech normal rhythm, rate & tone.   LABS 05/26/16 CBC: 7.5/14.7/44.0/254 BMP: 140/4.2/103/23/15/0.49/89/9.9 LFT: 4.4/?/0.4/111/15/9    Assessment & Plan:  61 y.o. female with long-standing history of tobacco use. Suspect she has underlying COPD and/or emphysema that could be contributing to her dyspnea on exertion. However, with her known history of coronary artery disease and prior myocardial infarction I am concerned that this could be angina contributing to her constellation of symptoms. She has not seen a cardiologist in some time. She has no hypoxia at baseline today but  certainly her dyspnea on exertion could be caused by hypoxia which will need to be assessed with a walk test. I did spend over 3 minutes counseling the patient will need for complete tobacco cessation to prevent worsening of her lung function as well as help mitigate her risk for esophageal, head and neck, and lung cancer as well as other medical problems not the least of which is her coronary artery disease. I instructed the patient contact my office if she had any new breathing problems or questions before her next appointment.  1. Dyspnea on exertion: Checking 6 minute walk test and full pulmonary function testing before next appointment. Prescribing patient albuterol inhaler to use with spacer as needed for cough, dyspnea, or wheezing. Holding off on further inhaler therapies at this time. Checking chest x-ray PA/LAT today. 2. GERD:  Prescribing the patient Zantac 150 mg by mouth daily at bedtime. Counseled patient on proper dietary restrictions. Consider esophagram if dysphagia does not significantly improve or new symptoms arise. 3. Tobacco use disorder: Patient counseled for over 3 minutes on the need for complete tobacco cessation. She has been prescribed Wellbutrin and I did counsel her to start this. Referring to our lung cancer screening program coordinator for low-dose chest CT imaging. 4. Hoarse voice: Considering ENT consult depending upon improvement in voice quality with control of reflux. 5. Coronary artery disease: Referring to cardiology for evaluation given history of myocardial infarction. 6. Health maintenance: Consider influenza vaccine at next appointment. Status post Pneumovax February 2018 & Tdap February 2018. 7. Follow-up: Return to clinic in 4 weeks or sooner if needed.  Sonia Baller Ashok Cordia, M.D. [facility] Pager:  [phone] After 3pm or if no response, call [phone] 10:01 AM 06/30/16

## 2016-06-30 NOTE — Patient Instructions (Signed)
   Call me if you have any new breathing problems or questions before your next appointment.  If the prescription Zantac costs too much you can buy over-the-counter Ranitidine which is the generic and take 1 pill at night before bed. Just buy the 150mg  tablets.  I will see you back in 4 weeks to review your test results.  TESTS ORDERED: 1. Full PFTs before next appointment 2. 6MWT before next appointment 3. CXR PA/LAT today

## 2016-06-30 NOTE — Telephone Encounter (Signed)
LABS 05/26/16 CBC: 7.5/14.7/44.0/254 BMP: 140/4.2/103/23/15/0.49/89/9.9 LFT: 4.4/?/0.4/111/15/9

## 2016-07-01 ENCOUNTER — Other Ambulatory Visit: Payer: Self-pay | Admitting: Acute Care

## 2016-07-01 DIAGNOSIS — F17210 Nicotine dependence, cigarettes, uncomplicated: Principal | ICD-10-CM

## 2016-07-11 ENCOUNTER — Telehealth: Payer: Self-pay

## 2016-07-11 NOTE — Telephone Encounter (Signed)
Pt called stating that the Wellbutrin is making her very angry and short tempered.  Advised pt to cut back the Wellbutrin to 1 tablet daily x 3 days then DC all together.  Advised pt to call back if she has any further questions/problems and to schedule OV if she would like to discuss further options to help with smoking cessation.  Pt expressed understanding and is agreeable.  Charyl Bigger, CMA

## 2016-07-14 NOTE — Progress Notes (Signed)
Cardiology Office Note   Date:  07/16/2016   ID:  Heidi Foster, DOB 20-Jul-1955, MRN [id number]  PCP:  Lillard Anes, NP  Cardiologist:   Minus Breeding, MD  Referring:  Dr. Ashok Cordia  Chief Complaint  Patient presents with  . Coronary Artery Disease      History of Present Illness: Heidi Foster is a 61 y.o. female who presents for evaluation of coronary disease. She reports a history of myocardial stenting or balloon angioplasty around 1999 when she was living in [place]. There was a catheter from 2000 or 2001. I don't have these details. She's been managed medically hasn't required any other intervention since then.  She's had difficult to control hypertension but seems to be a good regimen for the last couple of years. She's moved here with her only child and grandchild. She is limited by shortness of breath and is seen a pulmonologist.   She does get chest discomfort. This seems to be a somewhat stable pattern. He has been off and on over the years. It's different than her reflux which she gets with certain foods. Sporadic. It is not daily. Some of it is the back pain that she had with her previous MI. Occasionally it is chest discomfort. She can't bring it on with activities although she is limited. She'll walk through the grocery store.  With this she is limited by the shortness of breath. She does sleep on 3 pillows and his dose of 2 years. She doesn't have any PND.  She is not describing palpitations presyncope or syncope.    Past Medical History:  Diagnosis Date  . Allergy   . Cancer ([place])    skin  . Closed fracture of right patella   . GERD (gastroesophageal reflux disease)   . Heart attack 1999  . Hypertension   . Seborrheic keratoses     Past Surgical History:  Procedure Laterality Date  . caridac cath       Current Outpatient Prescriptions  Medication Sig Dispense Refill  . albuterol (PROVENTIL HFA;VENTOLIN HFA) 108 (90 Base) MCG/ACT inhaler Inhale 2 puffs into the lungs  every 4 (four) hours as needed for wheezing or shortness of breath. 1 Inhaler 3  . amLODipine (NORVASC) 2.5 MG tablet Take 1 tablet (2.5 mg total) by mouth daily. Stop taking Amlodipine 10mg . 90 tablet 2  . aspirin EC 81 MG tablet Take 81 mg by mouth daily.    Marland Kitchen buPROPion (WELLBUTRIN SR) 150 MG 12 hr tablet Take 1 tablet (150 mg total) by mouth 2 (two) times daily. First 3 days only 1 tablet by mouth, then on day 4 increase to twice daily. 90 tablet 1  . losartan (COZAAR) 100 MG tablet Take 1 tablet (100 mg total) by mouth daily. 90 tablet 0  . Multiple Vitamin (MULTIVITAMIN) tablet Take 1 tablet by mouth daily.    . ranitidine (ZANTAC) 150 MG tablet Take 1 tablet (150 mg total) by mouth at bedtime. 30 tablet 3  . Spacer/Aero-Holding Chambers (AEROCHAMBER MV) inhaler Use as instructed 1 each 0  . verapamil (VERELAN PM) 180 MG 24 hr capsule Take 1 capsule (180 mg total) by mouth daily. 90 capsule 0   No current facility-administered medications for this visit.     Allergies:   Codeine    Social History:  The patient  reports that she has been smoking Cigarettes.  She started smoking about 39 years ago. She has a 20.00 pack-year smoking history. She has never used  smokeless tobacco. She reports that she does not drink alcohol or use drugs.   Family History:  The patient's family history includes Alcohol abuse in her father; Cancer in her brother; Diabetes in her mother; Heart attack in her maternal grandmother; Hypertension in her mother; Multiple sclerosis in her sister; Stroke in her maternal grandmother and mother.    ROS:  Please see the history of present illness.   Otherwise, review of systems are positive for none.   All other systems are reviewed and negative.    PHYSICAL EXAM: VS:  BP 134/78 (BP Location: Right Arm)   Pulse 81   Ht 5\' 2"  (1.575 m)   Wt 137 lb 6.4 oz (62.3 kg)   BMI 25.13 kg/m  , BMI Body mass index is 25.13 kg/m. GENERAL:  Well appearing HEENT:  Pupils equal  round and reactive, fundi not visualized, oral mucosa unremarkable NECK:  No jugular venous distention, waveform within normal limits, carotid upstroke brisk and symmetric, no bruits, no thyromegaly LYMPHATICS:  No cervical, inguinal adenopathy LUNGS:  Clear to auscultation bilaterally BACK:  No CVA tenderness CHEST:  Unremarkable HEART:  PMI not displaced or sustained,S1 and S2 within normal limits, no S3, no S4, no clicks, no rubs, no murmurs ABD:  Flat, positive bowel sounds normal in frequency in pitch, no bruits, no rebound, no guarding, no midline pulsatile mass, no hepatomegaly, no splenomegaly EXT:  2 plus pulses throughout, no edema, no cyanosis no clubbing SKIN:  No rashes no nodules NEURO:  Cranial nerves II through XII grossly intact, motor grossly intact throughout PSYCH:  Cognitively intact, oriented to person place and time    EKG:  EKG is ordered today. The ekg ordered today demonstrates sinus rhythm, rate 81, axis within normal limits, intervals within normal limits, no acute ST-T wave changes.   Recent Labs: 05/26/2016: ALT 9; BUN 15; Creatinine, Ser 0.49; Platelets 254; Potassium 4.2; Sodium 142; TSH 0.928    Lipid Panel    Component Value Date/Time   CHOL 179 05/26/2016 1135   TRIG 99 05/26/2016 1135   HDL 48 05/26/2016 1135   CHOLHDL 3.7 05/26/2016 1135   LDLCALC 111 (H) 05/26/2016 1135      Wt Readings from Last 3 Encounters:  07/15/16 137 lb 6.4 oz (62.3 kg)  06/30/16 134 lb 3.2 oz (60.9 kg)  06/25/16 137 lb 12.8 oz (62.5 kg)      Other studies Reviewed: Additional studies/ records that were reviewed today include: None. Review of the above records demonstrates:  Please see elsewhere in the note.     ASSESSMENT AND PLAN:  CAD:  She has chest pain that seems to be a stable pattern. She needs stress testing. She does not want any kind of chemical stress testing. I will bring the patient back for a POET (Plain Old Exercise Test). This will allow me  to screen for obstructive coronary disease, risk stratify and very importantly provide a prescription for exercise.  DYSPNEA:  She is going to have a six minute walk test and PFTs.  We will also follow up as above.   TOBACCO ABUSE:   We talked about this and she is currently on Wellbutrin but doesn't like it. She's been to finish this prescription and will work further with her primary care physician  DYSLIPIDEMIA:  She does not want to take a statin. Her LDL was 111 with an HDL of 48. We talked about diet control.    Current medicines are reviewed at length  with the patient today.  The patient does not have concerns regarding medicines.  The following changes have been made:  no change  Labs/ tests ordered today include:   Orders Placed This Encounter  Procedures  . EXERCISE TOLERANCE TEST  . EKG 12-Lead     Disposition:   FU with me as needed after the POET (Plain Old Exercise Treadmill)    Signed, Minus Breeding, MD  07/16/2016 12:39 PM    [facility]

## 2016-07-15 ENCOUNTER — Ambulatory Visit (INDEPENDENT_AMBULATORY_CARE_PROVIDER_SITE_OTHER): Payer: BLUE CROSS/BLUE SHIELD | Admitting: Cardiology

## 2016-07-15 ENCOUNTER — Encounter: Payer: Self-pay | Admitting: Cardiology

## 2016-07-15 VITALS — BP 134/78 | HR 81 | Ht 62.0 in | Wt 137.4 lb

## 2016-07-15 DIAGNOSIS — R0609 Other forms of dyspnea: Secondary | ICD-10-CM | POA: Diagnosis not present

## 2016-07-15 DIAGNOSIS — E785 Hyperlipidemia, unspecified: Secondary | ICD-10-CM

## 2016-07-15 DIAGNOSIS — Z720 Tobacco use: Secondary | ICD-10-CM | POA: Diagnosis not present

## 2016-07-15 DIAGNOSIS — I25119 Atherosclerotic heart disease of native coronary artery with unspecified angina pectoris: Secondary | ICD-10-CM

## 2016-07-15 NOTE — Patient Instructions (Signed)

## 2016-07-16 ENCOUNTER — Encounter: Payer: Self-pay | Admitting: Cardiology

## 2016-07-17 ENCOUNTER — Ambulatory Visit (INDEPENDENT_AMBULATORY_CARE_PROVIDER_SITE_OTHER): Payer: BLUE CROSS/BLUE SHIELD

## 2016-07-17 DIAGNOSIS — R0609 Other forms of dyspnea: Secondary | ICD-10-CM

## 2016-07-17 LAB — EXERCISE TOLERANCE TEST
CHL RATE OF PERCEIVED EXERTION: 17
CSEPED: 1 min
CSEPEDS: 51 s
Estimated workload: 4.3 METS
MPHR: 161 {beats}/min
Peak HR: 106 {beats}/min
Percent HR: 65 %
Rest HR: 87 {beats}/min

## 2016-07-21 ENCOUNTER — Telehealth: Payer: Self-pay

## 2016-07-21 ENCOUNTER — Telehealth: Payer: Self-pay | Admitting: Adult Health

## 2016-07-21 NOTE — Telephone Encounter (Signed)
LVM for pt letting her know that we are thinking of her today since she is having skin cancer surgery today.  Advised pt to call us if she needs anything from our office.  Charyl Bigger, CMA

## 2016-07-21 NOTE — Telephone Encounter (Signed)
Pt called states she is responding to Tonya's call-- Skin Cancer  Biopsy performed today -- pls call her back @ [phone]. --glh

## 2016-07-21 NOTE — Telephone Encounter (Signed)
Returned pt call.  She states that she is awaiting pathology report, but that she tolerated the biopsy well, but is having some pain now.  Pt to call surgeon if she needs anything for pain.  Charyl Bigger, CMA

## 2016-07-29 ENCOUNTER — Encounter (HOSPITAL_COMMUNITY): Payer: BLUE CROSS/BLUE SHIELD

## 2016-08-01 ENCOUNTER — Encounter: Payer: Self-pay | Admitting: Acute Care

## 2016-08-01 ENCOUNTER — Ambulatory Visit (INDEPENDENT_AMBULATORY_CARE_PROVIDER_SITE_OTHER)
Admission: RE | Admit: 2016-08-01 | Discharge: 2016-08-01 | Disposition: A | Discharge status: Home / Self Care | Payer: BLUE CROSS/BLUE SHIELD | Source: Ambulatory Visit | Attending: Acute Care | Admitting: Acute Care

## 2016-08-01 ENCOUNTER — Ambulatory Visit (INDEPENDENT_AMBULATORY_CARE_PROVIDER_SITE_OTHER): Payer: BLUE CROSS/BLUE SHIELD | Admitting: Acute Care

## 2016-08-01 DIAGNOSIS — F17210 Nicotine dependence, cigarettes, uncomplicated: Secondary | ICD-10-CM

## 2016-08-01 NOTE — Progress Notes (Signed)
Shared Decision Making Visit Lung Cancer Screening Program [phone])   Eligibility:  Age 61 y.o.  Pack Years Smoking History Calculation 39 pack year smoker (# packs/per year x # years smoked)  Recent History of coughing up blood  no  Unexplained weight loss? no ( >Than 15 pounds within the last 6 months )  Prior History Lung / other cancer no (Diagnosis within the last 5 years already requiring surveillance chest CT Scans).  Smoking Status Current Smoker  Former Smokers: Years since quit: NA  Quit Date: NA  Visit Components:  Discussion included one or more decision making aids. yes  Discussion included risk/benefits of screening. yes  Discussion included potential follow up diagnostic testing for abnormal scans. yes  Discussion included meaning and risk of over diagnosis. yes  Discussion included meaning and risk of False Positives. yes  Discussion included meaning of total radiation exposure. yes  Counseling Included:  Importance of adherence to annual lung cancer LDCT screening. yes  Impact of comorbidities on ability to participate in the program. yes  Ability and willingness to under diagnostic treatment. yes  Smoking Cessation Counseling:  Current Smokers:   Discussed importance of smoking cessation. yes  Information about tobacco cessation classes and interventions provided to patient. yes  Patient provided with "ticket" for LDCT Scan. yes  Symptomatic Patient. no  Counseling  Diagnosis Code: Tobacco Use Z72.0  Asymptomatic Patient yes  Counseling (Intermediate counseling: > three minutes counseling) P3825  Former Smokers:   Discussed the importance of maintaining cigarette abstinence. yes  Diagnosis Code: Personal History of Nicotine Dependence. K53.976  Information about tobacco cessation classes and interventions provided to patient. Yes  Patient provided with "ticket" for LDCT Scan. yes  Written Order for Lung Cancer Screening with  LDCT placed in Epic. Yes (CT Chest Lung Cancer Screening Low Dose W/O CM) [id number] Z12.2-Screening of respiratory organs Z87.891-Personal history of nicotine dependence  I have spent 25 minutes of face to face time with Ms. Holstine discussing the risks and benefits of lung cancer screening. We viewed a power point together that explained in detail the above noted topics. We paused at intervals to allow for questions to be asked and answered to ensure understanding.We discussed that the single most powerful action that she can take to decrease her risk of developing lung cancer is to quit smoking. We discussed whether or not she is ready to commit to setting a quit date.She is not ready to set a quit date, but is working on quitting. We discussed options for tools to aid in quitting smoking including nicotine replacement therapy, non-nicotine medications, support groups, Quit Smart classes, and behavior modification. We discussed that often times setting smaller, more achievable goals, such as eliminating 1 cigarette a day for a week and then 2 cigarettes a day for a week can be helpful in slowly decreasing the number of cigarettes smoked. This allows for a sense of accomplishment as well as providing a clinical benefit. I gave her the " Be Stronger Than Your Excuses" card with contact information for community resources, classes, free nicotine replacement therapy, and access to mobile apps, text messaging, and on-line smoking cessation help. I have also given her my card and contact information in the event she needs to contact me. We discussed the time and location of the scan, and that either Doroteo Glassman RN or I will call with the results within 24-48 hours of receiving them. I have provided her  with a copy of the power point  we viewed  as a resource in the event they need reinforcement of the concepts we discussed today in the office. The patient verbalized understanding of all of  the above and had no  further questions upon leaving the office. They have my contact information in the event they have any further questions.  I spent 4 minutes of todays visit counseling on smoking cessation  We discussed that there has been a high incidence of CAD as an incidental finding on this scan. She is not on a statin medication, but is being seen by a cardiologist.I have told her we will fax her scan results to her PCP for follow up if CAD is noted on this non-gated exam.She verbalized understanding.  Magdalen Spatz, NP 08/01/2016

## 2016-08-05 ENCOUNTER — Ambulatory Visit: Payer: BLUE CROSS/BLUE SHIELD

## 2016-08-05 ENCOUNTER — Ambulatory Visit: Payer: BLUE CROSS/BLUE SHIELD | Admitting: Pulmonary Disease

## 2016-08-05 ENCOUNTER — Telehealth: Payer: Self-pay

## 2016-08-05 NOTE — Telephone Encounter (Signed)
Pt called stating that her blood pressure is 160/132 and she is feeling really "weird" in her head.  Advised pt to go ED immediately and that she should have someone drive her.  If she has no one to drive her to ED then she should call 911.  This message was relayed to the pt thru Annabell Sabal who informed the patient of these recommendations.  Charyl Bigger, CMA

## 2016-08-06 ENCOUNTER — Other Ambulatory Visit: Payer: Self-pay | Admitting: Acute Care

## 2016-08-06 DIAGNOSIS — F17210 Nicotine dependence, cigarettes, uncomplicated: Principal | ICD-10-CM

## 2016-08-07 ENCOUNTER — Ambulatory Visit: Payer: BLUE CROSS/BLUE SHIELD

## 2016-08-07 ENCOUNTER — Ambulatory Visit: Payer: BLUE CROSS/BLUE SHIELD | Admitting: Pulmonary Disease

## 2016-08-13 ENCOUNTER — Ambulatory Visit
Admission: RE | Admit: 2016-08-13 | Discharge: 2016-08-13 | Disposition: A | Discharge status: Home / Self Care | Payer: BLUE CROSS/BLUE SHIELD | Source: Ambulatory Visit | Attending: Adult Health | Admitting: Adult Health

## 2016-08-13 DIAGNOSIS — Z1239 Encounter for other screening for malignant neoplasm of breast: Secondary | ICD-10-CM

## 2016-08-18 ENCOUNTER — Other Ambulatory Visit: Payer: Self-pay | Admitting: Adult Health

## 2016-08-18 DIAGNOSIS — R928 Other abnormal and inconclusive findings on diagnostic imaging of breast: Secondary | ICD-10-CM

## 2016-08-22 ENCOUNTER — Ambulatory Visit
Admission: RE | Admit: 2016-08-22 | Discharge: 2016-08-22 | Disposition: A | Discharge status: Home / Self Care | Payer: BLUE CROSS/BLUE SHIELD | Source: Ambulatory Visit | Attending: Adult Health | Admitting: Adult Health

## 2016-08-22 ENCOUNTER — Other Ambulatory Visit: Payer: Self-pay | Admitting: Adult Health

## 2016-08-22 DIAGNOSIS — R928 Other abnormal and inconclusive findings on diagnostic imaging of breast: Secondary | ICD-10-CM

## 2016-08-25 ENCOUNTER — Ambulatory Visit
Admission: RE | Admit: 2016-08-25 | Discharge: 2016-08-25 | Disposition: A | Discharge status: Home / Self Care | Payer: BLUE CROSS/BLUE SHIELD | Source: Ambulatory Visit | Attending: Adult Health | Admitting: Adult Health

## 2016-08-25 DIAGNOSIS — R928 Other abnormal and inconclusive findings on diagnostic imaging of breast: Secondary | ICD-10-CM

## 2016-09-09 ENCOUNTER — Encounter: Payer: Self-pay | Admitting: Pulmonary Disease

## 2016-09-09 ENCOUNTER — Ambulatory Visit (INDEPENDENT_AMBULATORY_CARE_PROVIDER_SITE_OTHER): Payer: BLUE CROSS/BLUE SHIELD | Admitting: Pulmonary Disease

## 2016-09-09 ENCOUNTER — Ambulatory Visit (INDEPENDENT_AMBULATORY_CARE_PROVIDER_SITE_OTHER): Payer: BLUE CROSS/BLUE SHIELD | Admitting: *Deleted

## 2016-09-09 ENCOUNTER — Other Ambulatory Visit: Payer: BLUE CROSS/BLUE SHIELD

## 2016-09-09 VITALS — BP 124/72 | HR 80 | Ht 62.0 in | Wt 135.8 lb

## 2016-09-09 DIAGNOSIS — K219 Gastro-esophageal reflux disease without esophagitis: Secondary | ICD-10-CM | POA: Diagnosis not present

## 2016-09-09 DIAGNOSIS — F17200 Nicotine dependence, unspecified, uncomplicated: Secondary | ICD-10-CM

## 2016-09-09 DIAGNOSIS — R0609 Other forms of dyspnea: Secondary | ICD-10-CM

## 2016-09-09 DIAGNOSIS — R490 Dysphonia: Secondary | ICD-10-CM | POA: Diagnosis not present

## 2016-09-09 DIAGNOSIS — J432 Centrilobular emphysema: Secondary | ICD-10-CM | POA: Diagnosis not present

## 2016-09-09 DIAGNOSIS — R1310 Dysphagia, unspecified: Secondary | ICD-10-CM

## 2016-09-09 MED ORDER — UMECLIDINIUM-VILANTEROL 62.5-25 MCG/INH IN AEPB: 1.0000 | INHALATION_SPRAY | Freq: Every day | RESPIRATORY_TRACT | 0 refills | Status: DC

## 2016-09-09 NOTE — Addendum Note (Signed)
Addended by: Tyson Dense on: 09/09/2016 05:05 PM   Modules accepted: Orders

## 2016-09-09 NOTE — Progress Notes (Signed)
SIX MIN WALK 09/09/2016  Medications 5 am--norvasc, aspirin, losartan, MVI, verapamil  Supplimental Oxygen during Test? (L/min) No  Laps 5  Partial Lap (in Meters) 0  Baseline BP (sitting) 110/70  Baseline Heartrate 79  Baseline Dyspnea (Borg Scale) 3  Baseline Fatigue (Borg Scale) 4  Baseline SPO2 98  BP (sitting) 130/76  Heartrate 96  Dyspnea (Borg Scale) 4  Fatigue (Borg Scale) 4  SPO2 94  BP (sitting) 110/64  Heartrate 85  SPO2 100  Stopped or Paused before Six Minutes No  Distance Completed 240  Tech Comments: pt completed the test without difficulty.  she stated that her legs always hurt, so this was nothing new.

## 2016-09-09 NOTE — Progress Notes (Signed)
Subjective:    Patient ID: Heidi Foster, female    DOB: 24-Mar-1956, 61 y.o.   MRN: [id number]  C.C.:  Follow-up for Dyspnea on Exertion, Tobacco Use Disorder, & GERD.  HPI Dyspnea on exertion: No evidence of significant desaturation or hypoxia today. She continues to have dyspnea, especially with the increased heat. She is waking up at night with dyspnea. She continues to have coughing & wheezing. She reports cough produces a "clear mucus" and also "yellow" mucus at times. No hemoptysis. She does feel the albuterol inhaler helps significantly. She is using her inhaler 3 times a day at least.   Tobacco use disorder: Previously prescribed Wellbutrin by outside physician. Recommended starting this medication at last appointment. She reports no help from starting Wellbutrin. She reports she had significant agitation & anger on the Wellbutrin. She is still smoking 1/2 ppd.   GERD: Previously prescribed Zantac at last appointment. No reflux or dyspepsia. No morning brash water taste.   Review of Systems Patient still having problems with her hoarse voice. No chest pain or pressure. No fever or chills. No dysphagia or odynophagia except first thing in the morning when she gets up.   Allergies  Allergen Reactions  . Codeine Anaphylaxis    Current Outpatient Prescriptions on File Prior to Visit  Medication Sig Dispense Refill  . albuterol (PROVENTIL HFA;VENTOLIN HFA) 108 (90 Base) MCG/ACT inhaler Inhale 2 puffs into the lungs every 4 (four) hours as needed for wheezing or shortness of breath. 1 Inhaler 3  . amLODipine (NORVASC) 2.5 MG tablet Take 1 tablet (2.5 mg total) by mouth daily. Stop taking Amlodipine 10mg . 90 tablet 2  . aspirin EC 81 MG tablet Take 81 mg by mouth daily.    Marland Kitchen losartan (COZAAR) 100 MG tablet Take 1 tablet (100 mg total) by mouth daily. 90 tablet 0  . Multiple Vitamin (MULTIVITAMIN) tablet Take 1 tablet by mouth daily.    . ranitidine (ZANTAC) 150 MG tablet Take 1 tablet (150  mg total) by mouth at bedtime. 30 tablet 3  . Spacer/Aero-Holding Chambers (AEROCHAMBER MV) inhaler Use as instructed 1 each 0  . verapamil (VERELAN PM) 180 MG 24 hr capsule Take 1 capsule (180 mg total) by mouth daily. 90 capsule 0  . buPROPion (WELLBUTRIN SR) 150 MG 12 hr tablet Take 1 tablet (150 mg total) by mouth 2 (two) times daily. First 3 days only 1 tablet by mouth, then on day 4 increase to twice daily. (Patient not taking: Reported on 09/09/2016) 90 tablet 1   No current facility-administered medications on file prior to visit.     Past Medical History:  Diagnosis Date  . Allergy   . Cancer ([place])    skin  . Closed fracture of right patella   . GERD (gastroesophageal reflux disease)   . Heart attack ([place]) 1999  . Hypertension   . Seborrheic keratoses     Past Surgical History:  Procedure Laterality Date  . caridac cath      Family History  Problem Relation Age of Onset  . Diabetes Mother   . Hypertension Mother   . Stroke Mother   . Alcohol abuse Father   . Multiple sclerosis Sister   . Cancer Brother        lung  . Stroke Maternal Grandmother   . Heart attack Maternal Grandmother     Social History   Social History  . Marital status: Divorced    Spouse name: N/A  . Number of  children: N/A  . Years of education: N/A   Social History Main Topics  . Smoking status: Current Every Day Smoker    Packs/day: 0.50    Years: 40.00    Types: Cigarettes    Start date: 09/27/1976  . Smokeless tobacco: Never Used     Comment: Peak rate of 1ppd  . Alcohol use No  . Drug use: No  . Sexual activity: No   Other Topics Concern  . None   Social History Narrative   [facility] (06/30/16):   Originally from [place]. Moved to [facility] in April 2016. 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 Currently lives with her daughter & son-in-law in a mobile home. Has a cat currently. No mold or TB exposure. Always had a negative skin PPD. Previously  enjoyed crocheting.       Objective:   Physical Exam BP 124/72 (BP Location: Right Arm, Patient Position: Sitting, Cuff Size: Normal)   Pulse 80   Ht 5\' 2"  (1.575 m)   Wt 135 lb 12.8 oz (61.6 kg)   SpO2 94%   BMI 24.84 kg/m   Gen.: Caucasian female. No distress. Awake. Integument: No rash on exposed skin. Warm. Dry. HEENT: Tacky mucous membranes. No oral ulcers. No scleral icterus. Pulmonary: Prolonged exhalation phase. Clear with auscultation. Normal work of breathing on room air. Cardiovascular: Regular rate. Regular rhythm. No edema. Neurological: Cranial nerves II-12 grossly intact. No meningismus. Oriented 4. Extremities: No cyanosis. Abdomen: Soft. Nondistended. Normal bowel sounds.  6MWT 09/09/16:  Walked 240 meters / Baseline Sat 98% on RA / Nadir Sat 94% on RA  IMAGING LD CHEST CT W/O 08/01/16 (per radiologist):  Lung-RADS Category 1, negative. Repeat CT in 1 year. Borderline ascending aortic aneurysm. Mild centrilobular emphysema.   LABS 05/26/16 CBC: 7.5/14.7/44.0/254 BMP: 140/4.2/103/23/15/0.49/89/9.9 LFT: 4.4/?/0.4/111/15/9    Assessment & Plan:  61 y.o. female with history of dyspnea on exertion, tobacco use disorder, & GERD. Patient's walk test today demonstrates no significant oxygen desaturation or hypoxia that would benefit from oxygen therapy. Additionally, patient's heart rate response seemed to be somewhat blunted. Given her symptomatic improvement with the use of her albuterol rescue inhaler I do feel that her underlying emphysema is contributing to her dyspnea. Certainly there is a high probability that she has COPD as well. As such, I'm going to attempt to treat the patient with long-acting bronchodilator therapy. We did discuss her ongoing tobacco use which I feel is a main contributing factor for her underlying emphysema. Given the fact that her voice quality and hoarseness have not improved with control of her underlying reflux evaluation by ENT is necessary  I feel. I instructed the patient to contact my office if she had any new breathing problems or questions before her next appointment.  1. Dyspnea on exertion:  Likely secondary to emphysema and probable underlying COPD. Checking full pulmonary function testing as soon as possible. Trying the patient on Anoro. She will contact my office for a prescription if this seems to be effective. 2. Centrilobular emphysema:  Screening for alpha-1 antitrypsin deficiency today. Checking full pulmonary function testing as soon as possible. Repeat spirometry with bronchodilator challenge and next appointment. 3. Tobacco use disorder: Discuss tobacco use at length with the patient for over 3 minutes. Recommended caution with attempting use of Chantix given potential for mood and behavioral disturbances as well as suicidal ideation. She is going to address this with her primary care physician. 4.  GERD: Controlled with Zantac. No changes. 5. Voice changes/hoarseness:  Referring to ENT for evaluation.  6. Health maintenance: Status post Pneumovax February 2018 & Tdap February 2018. 7. Follow-up: Return to clinic in 3 months or sooner if needed.  Sonia Baller Ashok Cordia, M.D. [facility] Pager:  [phone] After 3pm or if no response, call [phone] 3:59 PM 09/09/16

## 2016-09-09 NOTE — Patient Instructions (Addendum)
   Use the Anoro inhaler that we are giving you - inhale 1 puff once once daily at the same time every day.  Call me if you feel the Anoro inhaler is helping and we will send in a prescription.  We are sending you to an ENT specialist to help check out your vocal chords.  TESTS ORDERED: 1. Full PFTS ASAP 2. Serum Alpha-1 Antitrypsin Phenotype today  3. Spirometry with bronchodilator challenge at follow-up

## 2016-09-09 NOTE — Progress Notes (Signed)
Patient seen in the office today and instructed on use of Anoro.  Patient expressed understanding and demonstrated technique. 

## 2016-09-12 LAB — ALPHA-1 ANTITRYPSIN PHENOTYPE: A-1 Antitrypsin: 160 mg/dL (ref 83–199)

## 2016-09-18 ENCOUNTER — Telehealth: Payer: Self-pay | Admitting: Pulmonary Disease

## 2016-09-18 NOTE — Telephone Encounter (Signed)
Contacted patient to schedule PFT and ROV; patient did not answer. Voicemail left was to contact office.

## 2016-09-23 ENCOUNTER — Ambulatory Visit (INDEPENDENT_AMBULATORY_CARE_PROVIDER_SITE_OTHER): Payer: BLUE CROSS/BLUE SHIELD | Admitting: Adult Health

## 2016-09-23 ENCOUNTER — Encounter: Payer: Self-pay | Admitting: Adult Health

## 2016-09-23 VITALS — BP 115/74 | HR 75 | Ht 62.0 in | Wt 136.1 lb

## 2016-09-23 DIAGNOSIS — R0609 Other forms of dyspnea: Secondary | ICD-10-CM | POA: Diagnosis not present

## 2016-09-23 DIAGNOSIS — E784 Other hyperlipidemia: Secondary | ICD-10-CM | POA: Diagnosis not present

## 2016-09-23 DIAGNOSIS — E7849 Other hyperlipidemia: Secondary | ICD-10-CM

## 2016-09-23 DIAGNOSIS — F17200 Nicotine dependence, unspecified, uncomplicated: Secondary | ICD-10-CM

## 2016-09-23 DIAGNOSIS — I10 Essential (primary) hypertension: Secondary | ICD-10-CM | POA: Diagnosis not present

## 2016-09-23 MED ORDER — VARENICLINE TARTRATE 0.5 MG X 11 & 1 MG X 42 PO MISC: ORAL | 0 refills | Status: DC

## 2016-09-23 NOTE — Assessment & Plan Note (Addendum)
Continue verapamil 180mg , amlodipine 2.5 mg, losartan 100mg  daily. Denies orthostatic hypotension follows heart healthy diet. Stress Test 06/2016, she has not followed up with cards.  She refuses to be seen by Dr. Percival Spanish and requests news cards referral-placed.

## 2016-09-23 NOTE — Assessment & Plan Note (Signed)
04/2016 lipid panel:  Cholesterol, Total 100 - 199 mg/dL 179   Triglycerides 0 - 149 mg/dL 99   HDL >39 mg/dL 48   VLDL Cholesterol Cal 5 - 40 mg/dL 20   LDL Calculated 0 - 99 mg/dL 111    Chol/HDL Ratio 0.0 - 4.4 ratio units 3.7

## 2016-09-23 NOTE — Assessment & Plan Note (Signed)
Failed Wellbutrin, last dose 07/2016. Started on Chantix-f/u in 4 weeks. Discussed at length the importance of smoking cessation.

## 2016-09-23 NOTE — Assessment & Plan Note (Addendum)
Started on Chantix. Continue inhalers as directed by pulmonology, has f/u appt scheduled for this summer.

## 2016-09-23 NOTE — Progress Notes (Signed)
Subjective:    Patient ID: Heidi Foster, female    DOB: April 24, 1956, 61 y.o.   MRN: [id number]  HPI:  Heidi Foster is here for regular f.u: HTN, GERD, and tobacco use disorder.  She has seen a myriad of specialists ( Dermatology, Cards, Pulmonology) in the last  3 months-please see EPIC notes.   Stress test 07/17/2016 results:  "This was a negative test but it was submaximal. I could not judge ischemia at a higher level of exercise. However, she has not wanted any other stress testing. No change in therapy is indicated. She should see me in the office in two months to discuss any ongoing symptoms or sooner if needed".  She has not followed up with Dr. Percival Spanish and requests new cardiology referral (placed today). She did not tolerate Wellbutrin (agitation and impatience), last dose was April 2018.  She continues to use tobacco, 1/2 pack a day.  She has previously failed Wellbutrin, Nicotine patches, nicotine gum- requests Chantix.  She denies hx of current sx's of depression/anxiety/thoughts of harming herself or others. She is compliant on all medication and denies need for RF today. She tries to follow heart healthy diet and drinks water (about 60 ounces) daily.  She continues to have dyspnea, especially with exertion.  She has upcoming ENT appt to address voice hoarseness/soreness.  Patient Care Team    Relationship Specialty Notifications Start End  Esaw Grandchild, NP PCP - General Family Medicine  05/26/16     Patient Active Problem List   Diagnosis Date Noted  . Centrilobular emphysema ([place]) 09/09/2016  . GERD (gastroesophageal reflux disease) 06/30/2016  . Dysphagia 06/30/2016  . Health care maintenance 06/25/2016  . Other fatigue 05/26/2016  . Hypertension 05/26/2016  . Other hyperlipidemia 05/26/2016  . Family history of diabetes mellitus in mother 05/26/2016  . Acute pain of right shoulder 05/26/2016  . Tobacco use disorder 05/26/2016  . Dyspnea on exertion 05/26/2016  . Chronic pain  of right knee 05/26/2016  . History of MI (myocardial infarction) 05/26/2016  . History of TIA (transient ischemic attack) 05/26/2016  . Numbness and tingling of left lower extremity 05/26/2016  . Numbness and tingling in left hand 05/26/2016  . 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 Allergy   . Cancer ([place])    skin  . Closed fracture of right patella   . GERD (gastroesophageal reflux disease)   . Heart attack ([place]) 1999  . Hypertension   . Seborrheic keratoses      Past Surgical History:  Procedure Laterality Date  . caridac cath    . SKIN BIOPSY     nose     Family History  Problem Relation Age of Onset  . Diabetes Mother   . Hypertension Mother   . Stroke Mother   . Alcohol abuse Father   . Multiple sclerosis Sister   . Cancer Brother        lung  . Stroke Maternal Grandmother   . Heart attack Maternal Grandmother      History  Drug Use No     History  Alcohol Use No     History  Smoking Status  . Current Every Day Smoker  . Packs/day: 0.50  . Years: 40.00  . Types: Cigarettes  . Start date: 09/27/1976  Smokeless Tobacco  . Never Used    Comment: Peak rate of 1ppd     Outpatient Encounter Prescriptions as of 09/23/2016  Medication Sig  . albuterol (PROVENTIL HFA;VENTOLIN HFA) 108 (90 Base) MCG/ACT inhaler Inhale 2 puffs into the lungs every 4 (four) hours as needed for wheezing or shortness of breath.  Marland Kitchen amLODipine (NORVASC) 2.5 MG tablet Take 1 tablet (2.5 mg total) by mouth daily. Stop taking Amlodipine 64m.  .Marland Kitchenaspirin EC 81 MG tablet Take 81 mg by mouth daily.  .Marland Kitchenlosartan (COZAAR) 100 MG tablet Take 1 tablet (100 mg total) by mouth daily.  . Multiple Vitamin (MULTIVITAMIN) tablet Take 1 tablet by mouth daily.  .Marland KitchenSpacer/Aero-Holding Chambers (AEROCHAMBER MV) inhaler Use as instructed  . umeclidinium-vilanterol (ANORO ELLIPTA) 62.5-25 MCG/INH AEPB Inhale 1 puff into the lungs daily.  . verapamil (VERELAN  PM) 180 MG 24 hr capsule Take 1 capsule (180 mg total) by mouth daily.  . varenicline (CHANTIX PAK) 0.5 MG X 11 & 1 MG X 42 tablet Take one 0.5 mg tablet by mouth once daily for 3 days, then increase to one 0.5 mg tablet twice daily for 4 days, then increase to one 1 mg tablet twice daily.  . [DISCONTINUED] buPROPion (WELLBUTRIN SR) 150 MG 12 hr tablet Take 1 tablet (150 mg total) by mouth 2 (two) times daily. First 3 days only 1 tablet by mouth, then on day 4 increase to twice daily. (Patient not taking: Reported on 09/09/2016)  . [DISCONTINUED] ranitidine (ZANTAC) 150 MG tablet Take 1 tablet (150 mg total) by mouth at bedtime.   No facility-administered encounter medications on file as of 09/23/2016.     Allergies: Codeine and Bupropion  Body mass index is 24.89 kg/m.  Blood pressure 115/74, pulse 75, height _0  (1.575 m), weight 136 lb 1.6 oz (61.7 kg).     Review of Systems  Constitutional: Positive for activity change and fatigue. Negative for appetite change, chills, diaphoresis, fever and unexpected weight change.  HENT: Positive for congestion, postnasal drip, sinus pressure and voice change. Negative for trouble swallowing.   Eyes: Negative for visual disturbance.  Respiratory: Positive for cough, shortness of breath and wheezing. Negative for chest tightness and stridor.   Cardiovascular: Negative for chest pain, palpitations and leg swelling.  Gastrointestinal: Negative for abdominal distention, abdominal pain, blood in stool, constipation, diarrhea, nausea and vomiting.  Endocrine: Negative for cold intolerance, heat intolerance, polydipsia, polyphagia and polyuria.  Genitourinary: Negative for difficulty urinating and flank pain.  Musculoskeletal: Positive for arthralgias, back pain, gait problem, joint swelling, myalgias, neck pain and neck stiffness.  Skin: Negative for color change, pallor, rash and wound.  Neurological: Negative for dizziness, tremors, weakness,  light-headedness and headaches.  Hematological: Does not bruise/bleed easily.  Psychiatric/Behavioral: Negative for behavioral problems, dysphoric mood, self-injury, sleep disturbance and suicidal ideas. The patient is not nervous/anxious and is not hyperactive.        Objective:   Physical Exam  Constitutional: She is oriented to person, place, and time. She appears well-developed and well-nourished. No distress.  HENT:  Head: Normocephalic and atraumatic.  Right Ear: Hearing, tympanic membrane, external ear and ear canal normal.  Left Ear: Tympanic membrane, external ear and ear canal normal.  Nose: Mucosal edema and rhinorrhea present. Right sinus exhibits no maxillary sinus tenderness and no frontal sinus tenderness. Left sinus exhibits no maxillary sinus tenderness and no frontal sinus tenderness.  Mouth/Throat: Uvula is midline. No tonsillar abscesses.  Very hoarse voiuce  Eyes: Conjunctivae are normal. Pupils are equal, round, and reactive to light.  Neck: Normal range of motion. Neck supple.  Cardiovascular: Normal rate, regular  rhythm, normal heart sounds and intact distal pulses.   No murmur heard. Pulmonary/Chest: Effort normal. No respiratory distress. She has wheezes in the right middle field, the right lower field, the left middle field and the left lower field. She has no rales. She exhibits no tenderness.  Abdominal: Soft. Bowel sounds are normal. She exhibits no distension and no mass. There is no tenderness. There is no rebound and no guarding.  Musculoskeletal: She exhibits edema.       Right knee: She exhibits decreased range of motion and swelling.       Left knee: She exhibits normal range of motion and no swelling.       Right ankle: She exhibits no swelling.       Left ankle: She exhibits no swelling.  Lymphadenopathy:    She has no cervical adenopathy.  Neurological: She is alert and oriented to person, place, and time. Coordination normal.  Skin: Skin is warm  and dry. No rash noted. She is not diaphoretic. No erythema. No pallor.  Psychiatric: She has a normal mood and affect. Her behavior is normal. Judgment and thought content normal.  Nursing note and vitals reviewed.         Assessment & Plan:   1. Hypertension, unspecified type   2. Tobacco use disorder   3. Dyspnea on exertion   4. Other hyperlipidemia     Hypertension Continue verapamil 168m, amlodipine 2.5 mg, losartan 104mdaily. Denies orthostatic hypotension follows heart healthy diet. Stress Test 06/2016, she has not followed up with cards.  She refuses to be seen by Dr. HoPercival Spanishnd requests news cards referral-placed.   Tobacco use disorder Failed Wellbutrin, last dose 07/2016. Started on Chantix-f/u in 4 weeks. Discussed at length the importance of smoking cessation.   Dyspnea on exertion Started on Chantix. Continue inhalers as directed by pulmonology, has f/u appt scheduled for this summer.   Other hyperlipidemia 04/2016 lipid panel:  Cholesterol, Total 100 - 199 mg/dL 179   Triglycerides 0 - 149 mg/dL 99   HDL >39 mg/dL 48   VLDL Cholesterol Cal 5 - 40 mg/dL 20   LDL Calculated 0 - 99 mg/dL 111    Chol/HDL Ratio 0.0 - 4.4 ratio units 3.7        FOLLOW-UP:  Return in about 4 weeks (around 10/21/2016) for Regular Follow Up, Evaluate Medication Effectiveness.

## 2016-09-23 NOTE — Patient Instructions (Addendum)
Varenicline oral tablets What is this medicine? VARENICLINE (var EN i kleen) is used to help people quit smoking. It can reduce the symptoms caused by stopping smoking. It is used with a patient support program recommended by your physician. This medicine may be used for other purposes; ask your health care provider or pharmacist if you have questions. COMMON BRAND NAME(S): Chantix What should I tell my health care provider before I take this medicine? They need to know if you have any of these conditions: -bipolar disorder, depression, schizophrenia or other mental illness -heart disease -if you often drink alcohol -kidney disease -peripheral vascular disease -seizures -stroke -suicidal thoughts, plans, or attempt; a previous suicide attempt by you or a family member -an unusual or allergic reaction to varenicline, other medicines, foods, dyes, or preservatives -pregnant or trying to get pregnant -breast-feeding How should I use this medicine? Take this medicine by mouth after eating. Take with a full glass of water. Follow the directions on the prescription label. Take your doses at regular intervals. Do not take your medicine more often than directed. There are 3 ways you can use this medicine to help you quit smoking; talk to your health care professional to decide which plan is right for you: 1) you can choose a quit date and start this medicine 1 week before the quit date, or, 2) you can start taking this medicine before you choose a quit date, and then pick a quit date between day 8 and 35 days of treatment, or, 3) if you are not sure that you are able or willing to quit smoking right away, start taking this medicine and slowly decrease the amount you smoke as directed by your health care professional with the goal of being cigarette-free by week 12 of treatment. Stick to your plan; ask about support groups or other ways to help you remain cigarette-free. If you are motivated to quit  smoking and did not succeed during a previous attempt with this medicine for reasons other than side effects, or if you returned to smoking after this treatment, speak with your health care professional about whether another course of this medicine may be right for you. A special MedGuide will be given to you by the pharmacist with each prescription and refill. Be sure to read this information carefully each time. Talk to your pediatrician regarding the use of this medicine in children. This medicine is not approved for use in children. Overdosage: If you think you have taken too much of this medicine contact a poison control center or emergency room at once. NOTE: This medicine is only for you. Do not share this medicine with others. What if I miss a dose? If you miss a dose, take it as soon as you can. If it is almost time for your next dose, take only that dose. Do not take double or extra doses. What may interact with this medicine? -alcohol or any product that contains alcohol -insulin -other stop smoking aids -theophylline -warfarin This list may not describe all possible interactions. Give your health care provider a list of all the medicines, herbs, non-prescription drugs, or dietary supplements you use. Also tell them if you smoke, drink alcohol, or use illegal drugs. Some items may interact with your medicine. What should I watch for while using this medicine? Visit your doctor or health care professional for regular check ups. Ask for ongoing advice and encouragement from your doctor or healthcare professional, friends, and family to help you quit. If   you smoke while on this medication, quit again Your mouth may get dry. Chewing sugarless gum or sucking hard candy, and drinking plenty of water may help. Contact your doctor if the problem does not go away or is severe. You may get drowsy or dizzy. Do not drive, use machinery, or do anything that needs mental alertness until you know how  this medicine affects you. Do not stand or sit up quickly, especially if you are an older patient. This reduces the risk of dizzy or fainting spells. Sleepwalking can happen during treatment with this medicine, and can sometimes lead to behavior that is harmful to you, other people, or property. Stop taking this medicine and tell your doctor if you start sleepwalking or have other unusual sleep-related activity. Decrease the amount of alcoholic beverages that you drink during treatment with this medicine until you know if this medicine affects your ability to tolerate alcohol. Some people have experienced increased drunkenness (intoxication), unusual or sometimes aggressive behavior, or no memory of things that have happened (amnesia) during treatment with this medicine. The use of this medicine may increase the chance of suicidal thoughts or actions. Pay special attention to how you are responding while on this medicine. Any worsening of mood, or thoughts of suicide or dying should be reported to your health care professional right away. What side effects may I notice from receiving this medicine? Side effects that you should report to your doctor or health care professional as soon as possible: -allergic reactions like skin rash, itching or hives, swelling of the face, lips, tongue, or throat -acting aggressive, being angry or violent, or acting on dangerous impulses -breathing problems -changes in vision -chest pain or chest tightness -confusion, trouble speaking or understanding -new or worsening depression, anxiety, or panic attacks -extreme increase in activity and talking (mania) -fast, irregular heartbeat -feeling faint or lightheaded, falls -fever -pain in legs when walking -problems with balance, talking, walking -redness, blistering, peeling or loosening of the skin, including inside the mouth -ringing in ears -seeing or hearing things that aren't there  (hallucinations) -seizures -sleepwalking -sudden numbness or weakness of the face, arm or leg -thoughts about suicide or dying, or attempts to commit suicide -trouble passing urine or change in the amount of urine -unusual bleeding or bruising -unusually weak or tired Side effects that usually do not require medical attention (report to your doctor or health care professional if they continue or are bothersome): -constipation -headache -nausea, vomiting -strange dreams -stomach gas -trouble sleeping This list may not describe all possible side effects. Call your doctor for medical advice about side effects. You may report side effects to FDA at 1-800-FDA-1088. Where should I keep my medicine? Keep out of the reach of children. Store at room temperature between 15 and 30 degrees C (59 and 86 degrees F). Throw away any unused medicine after the expiration date. NOTE: This sheet is a summary. It may not cover all possible information. If you have questions about this medicine, talk to your doctor, pharmacist, or health care provider.  2018 Elsevier/Gold Standard (2014-12-28 16:14:23)   Heart-Healthy Eating Plan Many factors influence your heart health, including eating and exercise habits. Heart (coronary) risk increases with abnormal blood fat (lipid) levels. Heart-healthy meal planning includes limiting unhealthy fats, increasing healthy fats, and making other small dietary changes. This includes maintaining a healthy body weight to help keep lipid levels within a normal range. What is my plan? Your health care provider recommends that you:  Get no  more than _________% of the total calories in your daily diet from fat.  Limit your intake of saturated fat to less than _________% of your total calories each day.  Limit the amount of cholesterol in your diet to less than _________ mg per day. What types of fat should I choose?  Choose healthy fats more often. Choose monounsaturated  and polyunsaturated fats, such as olive oil and canola oil, flaxseeds, walnuts, almonds, and seeds.  Eat more omega-3 fats. Good choices include salmon, mackerel, sardines, tuna, flaxseed oil, and ground flaxseeds. Aim to eat fish at least two times each week.  Limit saturated fats. Saturated fats are primarily found in animal products, such as meats, butter, and cream. Plant sources of saturated fats include palm oil, palm kernel oil, and coconut oil.  Avoid foods with partially hydrogenated oils in them. These contain trans fats. Examples of foods that contain trans fats are stick margarine, some tub margarines, cookies, crackers, and other baked goods. What general guidelines do I need to follow?  Check food labels carefully to identify foods with trans fats or high amounts of saturated fat.  Fill one half of your plate with vegetables and green salads. Eat 4-5 servings of vegetables per day. A serving of vegetables equals 1 cup of raw leafy vegetables,  cup of raw or cooked cut-up vegetables, or  cup of vegetable juice.  Fill one fourth of your plate with whole grains. Look for the word "whole" as the first word in the ingredient list.  Fill one fourth of your plate with lean protein foods.  Eat 4-5 servings of fruit per day. A serving of fruit equals one medium whole fruit,  cup of dried fruit,  cup of fresh, frozen, or canned fruit, or  cup of 100% fruit juice.  Eat more foods that contain soluble fiber. Examples of foods that contain this type of fiber are apples, broccoli, carrots, beans, peas, and barley. Aim to get 20-30 g of fiber per day.  Eat more home-cooked food and less restaurant, buffet, and fast food.  Limit or avoid alcohol.  Limit foods that are high in starch and sugar.  Avoid fried foods.  Cook foods by using methods other than frying. Baking, boiling, grilling, and broiling are all great options. Other fat-reducing suggestions include:  Removing the skin  from poultry.  Removing all visible fats from meats.  Skimming the fat off of stews, soups, and gravies before serving them.  Steaming vegetables in water or broth.  Lose weight if you are overweight. Losing just 5-10% of your initial body weight can help your overall health and prevent diseases such as diabetes and heart disease.  Increase your consumption of nuts, legumes, and seeds to 4-5 servings per week. One serving of dried beans or legumes equals  cup after being cooked, one serving of nuts equals 1 ounces, and one serving of seeds equals  ounce or 1 tablespoon.  You may need to monitor your salt (sodium) intake, especially if you have high blood pressure. Talk with your health care provider or dietitian to get more information about reducing sodium. What foods can I eat? Grains   Breads, including [place], white, pita, wheat, raisin, rye, oatmeal, and [place]. Tortillas that are neither fried nor made with lard or trans fat. Low-fat rolls, including hotdog and hamburger buns and English muffins. Biscuits. Muffins. Waffles. Pancakes. Light popcorn. Whole-grain cereals. Flatbread. Melba toast. Pretzels. Breadsticks. Rusks. Low-fat snacks and crackers, including oyster, saltine, matzo, graham, animal,  and rye. Rice and pasta, including brown rice and those that are made with whole wheat. Vegetables  All vegetables. Fruits  All fruits, but limit coconut. Meats and Other Protein Sources  Lean, well-trimmed beef, veal, pork, and lamb. Chicken and [place] without skin. All fish and shellfish. Wild duck, rabbit, pheasant, and venison. Egg whites or low-cholesterol egg substitutes. Dried beans, peas, lentils, and tofu.Seeds and most nuts. Dairy  Low-fat or nonfat cheeses, including ricotta, string, and mozzarella. Skim or 1% milk that is liquid, powdered, or evaporated. Buttermilk that is made with low-fat milk. Nonfat or low-fat yogurt. Beverages  Mineral water. Diet carbonated  beverages. Sweets and Desserts  [place] and fruit ices. Honey, jam, marmalade, jelly, and syrups. Meringues and gelatins. Pure sugar candy, such as hard candy, jelly beans, gumdrops, mints, marshmallows, and small amounts of dark chocolate. [employer] Eat all sweets and desserts in moderation. Fats and Oils  Nonhydrogenated (trans-free) margarines. Vegetable oils, including soybean, sesame, sunflower, olive, peanut, safflower, corn, canola, and cottonseed. Salad dressings or mayonnaise that are made with a vegetable oil. Limit added fats and oils that you use for cooking, baking, salads, and as spreads. Other  Cocoa powder. Coffee and tea. All seasonings and condiments. The items listed above may not be a complete list of recommended foods or beverages. Contact your dietitian for more options.  What foods are not recommended? Grains  Breads that are made with saturated or trans fats, oils, or whole milk. Croissants. Butter rolls. Cheese breads. Sweet rolls. Donuts. Buttered popcorn. Chow mein noodles. High-fat crackers, such as cheese or butter crackers. Meats and Other Protein Sources  Fatty meats, such as hotdogs, short ribs, sausage, spareribs, bacon, ribeye roast or steak, and mutton. High-fat deli meats, such as salami and bologna. Caviar. Domestic duck and goose. Organ meats, such as kidney, liver, sweetbreads, brains, gizzard, chitterlings, and heart. Dairy  Cream, sour cream, cream cheese, and creamed cottage cheese. Whole milk cheeses, including blue (bleu), Monte[place] Jack, [place], [place], American, [place], Swiss, [place], [place], and [place]. Whole or 2% milk that is liquid, evaporated, or condensed. Whole buttermilk. Cream sauce or high-fat cheese sauce. Yogurt that is made from whole milk. Beverages  Regular sodas and drinks with added sugar. Sweets and Desserts  Frosting. Pudding. Cookies. Cakes other than angel food cake. Candy that has milk chocolate or white chocolate,  hydrogenated fat, butter, coconut, or unknown ingredients. Buttered syrups. Full-fat ice cream or ice cream drinks. Fats and Oils  Gravy that has suet, meat fat, or shortening. Cocoa butter, hydrogenated oils, palm oil, coconut oil, palm kernel oil. These can often be found in baked products, candy, fried foods, nondairy creamers, and whipped toppings. Solid fats and shortenings, including bacon fat, salt pork, lard, and butter. Nondairy cream substitutes, such as coffee creamers and sour cream substitutes. Salad dressings that are made of unknown oils, cheese, or sour cream. The items listed above may not be a complete list of foods and beverages to avoid. Contact your dietitian for more information.  This information is not intended to replace advice given to you by your health care provider. Make sure you discuss any questions you have with your health care provider. Document Released: 01/22/2008 Document Revised: 11/02/2015 Document Reviewed: 10/06/2013 Elsevier Interactive Patient Education  2017 [place].   Please take Chantix as directed. Please continue other medications as directed. Follow Heart Healthy Diet and regular movement as tolerated (i.e. Shortness of breath). Keep all specialist appts. New cardiology referral placed, requested  new practice. Return in 4 weeks for re-evaluation of Chantix, please call clinic with any questions/concerns.

## 2016-09-24 NOTE — Telephone Encounter (Signed)
Patient was contacted to schedule appointments. Pt did not answer; voicemail was left to contact office. Pt appointment referral sheet was received from [facility], appointment sheet will be placed in mail and sent to address on file.

## 2016-09-25 ENCOUNTER — Emergency Department (HOSPITAL_COMMUNITY): Payer: BLUE CROSS/BLUE SHIELD

## 2016-09-25 ENCOUNTER — Encounter (HOSPITAL_COMMUNITY): Payer: Self-pay | Admitting: Nurse Practitioner

## 2016-09-25 ENCOUNTER — Emergency Department (HOSPITAL_COMMUNITY)
Admission: EM | Admit: 2016-09-25 | Discharge: 2016-09-25 | Discharge status: Against Medical Advice | Payer: BLUE CROSS/BLUE SHIELD | Attending: Emergency Medicine | Admitting: Emergency Medicine

## 2016-09-25 DIAGNOSIS — Z720 Tobacco use: Secondary | ICD-10-CM

## 2016-09-25 DIAGNOSIS — R112 Nausea with vomiting, unspecified: Secondary | ICD-10-CM | POA: Insufficient documentation

## 2016-09-25 DIAGNOSIS — F17210 Nicotine dependence, cigarettes, uncomplicated: Secondary | ICD-10-CM | POA: Diagnosis not present

## 2016-09-25 DIAGNOSIS — Z8673 Personal history of transient ischemic attack (TIA), and cerebral infarction without residual deficits: Secondary | ICD-10-CM | POA: Insufficient documentation

## 2016-09-25 DIAGNOSIS — R079 Chest pain, unspecified: Secondary | ICD-10-CM | POA: Diagnosis present

## 2016-09-25 DIAGNOSIS — Z85828 Personal history of other malignant neoplasm of skin: Secondary | ICD-10-CM | POA: Insufficient documentation

## 2016-09-25 DIAGNOSIS — R110 Nausea: Secondary | ICD-10-CM

## 2016-09-25 DIAGNOSIS — R0601 Orthopnea: Secondary | ICD-10-CM | POA: Insufficient documentation

## 2016-09-25 DIAGNOSIS — R0602 Shortness of breath: Secondary | ICD-10-CM

## 2016-09-25 DIAGNOSIS — R0609 Other forms of dyspnea: Secondary | ICD-10-CM

## 2016-09-25 DIAGNOSIS — J449 Chronic obstructive pulmonary disease, unspecified: Secondary | ICD-10-CM | POA: Diagnosis not present

## 2016-09-25 DIAGNOSIS — I252 Old myocardial infarction: Secondary | ICD-10-CM | POA: Diagnosis not present

## 2016-09-25 DIAGNOSIS — R0600 Dyspnea, unspecified: Secondary | ICD-10-CM | POA: Diagnosis not present

## 2016-09-25 DIAGNOSIS — I10 Essential (primary) hypertension: Secondary | ICD-10-CM | POA: Diagnosis not present

## 2016-09-25 DIAGNOSIS — Z79899 Other long term (current) drug therapy: Secondary | ICD-10-CM | POA: Diagnosis not present

## 2016-09-25 LAB — CBC
HCT: 42.3 % (ref 36.0–46.0)
Hemoglobin: 13.5 g/dL (ref 12.0–15.0)
MCH: 29.5 pg (ref 26.0–34.0)
MCHC: 31.9 g/dL (ref 30.0–36.0)
MCV: 92.4 fL (ref 78.0–100.0)
Platelets: 220 10*3/uL (ref 150–400)
RBC: 4.58 MIL/uL (ref 3.87–5.11)
RDW: 14.2 % (ref 11.5–15.5)
WBC: 11.2 10*3/uL — ABNORMAL HIGH (ref 4.0–10.5)

## 2016-09-25 LAB — BASIC METABOLIC PANEL
Anion gap: 8 (ref 5–15)
BUN: 13 mg/dL (ref 6–20)
CO2: 24 mmol/L (ref 22–32)
Calcium: 9.7 mg/dL (ref 8.9–10.3)
Chloride: 105 mmol/L (ref 101–111)
Creatinine, Ser: 0.56 mg/dL (ref 0.44–1.00)
GFR calc Af Amer: >60 mL/min (ref >60)
GFR calc non Af Amer: >60 mL/min (ref >60)
Glucose, Bld: 126 mg/dL — ABNORMAL HIGH (ref 65–99)
Potassium: 3.4 mmol/L — ABNORMAL LOW (ref 3.5–5.1)
Sodium: 137 mmol/L (ref 135–145)

## 2016-09-25 LAB — BRAIN NATRIURETIC PEPTIDE: B NATRIURETIC PEPTIDE 5: 56.6 pg/mL (ref 0.0–100.0)

## 2016-09-25 LAB — I-STAT TROPONIN, ED: Troponin i, poc: 0 ng/mL (ref 0.00–0.08)

## 2016-09-25 MED ORDER — ONDANSETRON 4 MG PO TBDP: 4.0000 mg | ORAL_TABLET | Freq: Three times a day (TID) | ORAL | 0 refills | Status: DC | PRN

## 2016-09-25 MED ORDER — ASPIRIN 81 MG PO CHEW
243.0000 mg | CHEWABLE_TABLET | Freq: Once | ORAL | Status: AC
  Administered 2016-09-25: 243 mg via ORAL
  Filled 2016-09-25: qty 3

## 2016-09-25 MED ORDER — FENTANYL CITRATE (PF) 100 MCG/2ML IJ SOLN
50.0000 ug | Freq: Once | INTRAMUSCULAR | Status: AC
  Administered 2016-09-25: 50 ug via INTRAVENOUS
  Filled 2016-09-25: qty 2

## 2016-09-25 MED ORDER — IOPAMIDOL (ISOVUE-370) INJECTION 76%
INTRAVENOUS | Status: AC
  Administered 2016-09-25: 100 mL
  Filled 2016-09-25: qty 100

## 2016-09-25 MED ORDER — GI COCKTAIL ~~LOC~~
30.0000 mL | Freq: Once | ORAL | Status: AC
  Administered 2016-09-25: 30 mL via ORAL
  Filled 2016-09-25: qty 30

## 2016-09-25 MED ORDER — ONDANSETRON HCL 4 MG/2ML IJ SOLN
4.0000 mg | Freq: Once | INTRAMUSCULAR | Status: AC
  Administered 2016-09-25: 4 mg via INTRAVENOUS
  Filled 2016-09-25: qty 2

## 2016-09-25 NOTE — ED Triage Notes (Signed)
Pt presents with c/o chest pain. The pain began yesterday and has been constant since onset. She describes the pain as a heavy, tight pain. The pain is radiating into her L shoulder, neck,upper back. She reports fatigue, shortness of breath. She denies weakness, dizziness, diaphoresis, fevers, cough. She tried tums, sprite with no relief of the pain. She has taken 81mg  of Asprin this morning

## 2016-09-25 NOTE — ED Notes (Signed)
Pt leaving [facility] pt understands d/c instructions.

## 2016-09-25 NOTE — Discharge Instructions (Signed)
You are signing out against medical advice, prior to your complete evaluation being done. Continue taking your usual home medications, stay well hydrated, use tylenol and motrin as needed for pain, use your home inhalers, use zofran as directed as needed for nausea, and follow up with your regular doctor and your cardiologist tomorrow for ongoing evaluation of your chest pain and shortness of breath. Stop smoking! Return to the ER for emergent changes or worsening symptoms.

## 2016-09-25 NOTE — ED Provider Notes (Signed)
[facility] DEPT Provider Note   CSN: [id number] Arrival date & time: 09/25/16  1507     History   Chief Complaint Chief Complaint  Patient presents with  . Chest Pain    HPI Heidi Foster is a 61 y.o. female with a PMHx of skin cancer to the nose, GERD, remote MI, HTN, COPD, HLD, chronic dyspnea on exertion, TIA, paresthesias L arm/hand, and other medical conditions listed below, who presents to the ED with complaints of chest pain that began yesterday while at rest with associated nausea, as well as several months of worsening shortness of breath, wheezing, cough, and orthopnea (4 pillows up from usual 2 pillows, over the last 2 weeks). Patient states that over the last 5 months she's had worsening shortness of breath with exertion and cough with yellowish sputum production. Last night she was sitting at rest when she developed CP that she describes as 7/10 constant pressure-like central lower sternal chest pain that radiated into her upper back and neck, worse with exertion, and unrelieved with Tums and 7-Up. Since then she feels like her shortness of breath is slightly worse than normal. She states that she continues to use her home inhalers which helped with the wheeze in the cough, and denies any worsening of the cough or increase in sputum production. She reports associated nausea as well. She does continue to smoke cigarettes although she just got an rx for chantix but hasn't started taking it; hadn't tolerated wellbutrin for smoking cessation. +FHx of MI in her MGM and her mother at age 55y/o (survived). +Remote hx of MI in 1999. Of note, chart review reveals she saw Dr. Rosezella [place] office for her dyspnea/HTN and she underwent an exercise stress test in 06/2016 which was suboptimal and somewhat inconclusive due to inability to get to target HR. Per Dr. Rosezella Florida NP, pt opted for no further testing, and would just follow up, but no changes to regimen done at that time. Further chart review  reveals she has been seen by Dr. Creig Hines of pulmonology multiple times for eval of her dyspnea, and had a Low-dost chest CT in 07/2016 which showed borderline ascending aortic aneurysm and atherosclerosis but otherwise no acute findings or concerning evidence of cancer.  She denies diaphoresis, lightheadedness, fevers, chills, worsening cough, hemoptysis, LE swelling, recent travel/surgery/immobilization, estrogen use, personal/family hx of DVT/PE, abd pain, V/D/C, hematuria, dysuria, myalgias, arthralgias, claudication, numbness, tingling, focal weakness, or any other complaints at this time. Her PCP is Dr. Mina Marble, whom she saw 3 days ago.   The history is provided by the patient and medical records. No language interpreter was used.  Chest Pain   This is a new problem. The current episode started yesterday. The problem occurs constantly. The problem has not changed since onset.The pain is associated with rest. The pain is present in the substernal region. The pain is at a severity of 7/10. The pain is moderate. The quality of the pain is described as pressure-like. The pain radiates to the left neck, right neck and upper back. Duration of episode(s) is 2 days. The symptoms are aggravated by exertion. Associated symptoms include cough (chronic, unchanged), nausea, orthopnea, shortness of breath (slightly worse than baseline) and sputum production (chronic, unchanged). Pertinent negatives include no abdominal pain, no claudication, no diaphoresis, no fever, no hemoptysis, no leg pain, no lower extremity edema, no numbness, no vomiting and no weakness. She has tried antacids for the symptoms. The treatment provided no relief. Risk factors include smoking/tobacco exposure.  Her past medical history is significant for CAD, COPD, hyperlipidemia, hypertension, MI and TIA.  Pertinent negatives for past medical history include no DVT and no PE.  Her family medical history is significant for CAD and early  MI.  Pertinent negatives for family medical history include: no PE.  Procedure history is positive for exercise treadmill test.    Past Medical History:  Diagnosis Date  . Allergy   . Cancer ([place])    skin  . Closed fracture of right patella   . GERD (gastroesophageal reflux disease)   . Heart attack ([place]) 1999  . Hypertension   . Seborrheic keratoses     Patient Active Problem List   Diagnosis Date Noted  . Centrilobular emphysema ([place]) 09/09/2016  . GERD (gastroesophageal reflux disease) 06/30/2016  . Dysphagia 06/30/2016  . Health care maintenance 06/25/2016  . Other fatigue 05/26/2016  . Hypertension 05/26/2016  . Other hyperlipidemia 05/26/2016  . Family history of diabetes mellitus in mother 05/26/2016  . Acute pain of right shoulder 05/26/2016  . Tobacco use disorder 05/26/2016  . Dyspnea on exertion 05/26/2016  . Chronic pain of right knee 05/26/2016  . History of MI (myocardial infarction) 05/26/2016  . History of TIA (transient ischemic attack) 05/26/2016  . Numbness and tingling of left lower extremity 05/26/2016  . Numbness and tingling in left hand 05/26/2016  . 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    Past Surgical History:  Procedure Laterality Date  . caridac cath    . SKIN BIOPSY     nose    OB History    No data available       Home Medications    Prior to Admission medications   Medication Sig Start Date End Date Taking? Authorizing Provider  albuterol (PROVENTIL HFA;VENTOLIN HFA) 108 (90 Base) MCG/ACT inhaler Inhale 2 puffs into the lungs every 4 (four) hours as needed for wheezing or shortness of breath. 06/30/16   Javier Glazier, MD  amLODipine (NORVASC) 2.5 MG tablet Take 1 tablet (2.5 mg total) by mouth daily. Stop taking Amlodipine 10mg . 06/26/16   Danford, Valetta Fuller D, NP  aspirin EC 81 MG tablet Take 81 mg by mouth daily.    [provider]  losartan (COZAAR) 100 MG tablet Take 1 tablet (100 mg total) by mouth daily. 05/26/16   Danford,  Valetta Fuller D, NP  Multiple Vitamin (MULTIVITAMIN) tablet Take 1 tablet by mouth daily.    [provider]  Spacer/Aero-Holding Chambers (AEROCHAMBER MV) inhaler Use as instructed 06/30/16   Javier Glazier, MD  umeclidinium-vilanterol [facility] ELLIPTA) 62.5-25 MCG/INH AEPB Inhale 1 puff into the lungs daily. 09/09/16   Javier Glazier, MD  varenicline (CHANTIX PAK) 0.5 MG X 11 & 1 MG X 42 tablet Take one 0.5 mg tablet by mouth once daily for 3 days, then increase to one 0.5 mg tablet twice daily for 4 days, then increase to one 1 mg tablet twice daily. 09/23/16   Danford, Valetta Fuller D, NP  verapamil (VERELAN PM) 180 MG 24 hr capsule Take 1 capsule (180 mg total) by mouth daily. 05/26/16   Esaw Grandchild, NP    Family History Family History  Problem Relation Age of Onset  . Diabetes Mother   . Hypertension Mother   . Stroke Mother   . Alcohol abuse Father   . Multiple sclerosis Sister   . Cancer Brother        lung  . Stroke Maternal Grandmother   . Heart attack Maternal Grandmother  Social History Social History  Substance Use Topics  . Smoking status: Current Every Day Smoker    Packs/day: 0.50    Years: 40.00    Types: Cigarettes    Start date: 09/27/1976  . Smokeless tobacco: Never Used     Comment: Peak rate of 1ppd  . Alcohol use No     Allergies   Codeine and Bupropion   Review of Systems Review of Systems  Constitutional: Negative for chills, diaphoresis and fever.  Respiratory: Positive for cough (chronic, unchanged), sputum production (chronic, unchanged), shortness of breath (slightly worse than baseline) and wheezing. Negative for hemoptysis.        +4 pillow orthopnea (up from 2)  Cardiovascular: Positive for chest pain and orthopnea. Negative for claudication and leg swelling.  Gastrointestinal: Positive for nausea. Negative for abdominal pain, constipation, diarrhea and vomiting.  Genitourinary: Negative for dysuria and hematuria.  Musculoskeletal: Negative  for arthralgias and myalgias.  Skin: Negative for color change.  Allergic/Immunologic: Negative for immunocompromised state.  Neurological: Negative for weakness, light-headedness and numbness.  Psychiatric/Behavioral: Negative for confusion.   All other systems reviewed and are negative for acute change except as noted in the HPI.    Physical Exam Updated Vital Signs BP 122/78   Pulse 73   Temp 99.1 F (37.3 C) (Oral)   Resp 15   SpO2 99%    Physical Exam  Constitutional: She is oriented to person, place, and time. Vital signs are normal. She appears well-developed and well-nourished.  Non-toxic appearance. No distress.  Afebrile, nontoxic, NAD  HENT:  Head: Normocephalic and atraumatic.  Mouth/Throat: Oropharynx is clear and moist and mucous membranes are normal.  Eyes: Conjunctivae and EOM are normal. Right eye exhibits no discharge. Left eye exhibits no discharge.  Neck: Normal range of motion. Neck supple.  Cardiovascular: Normal rate, regular rhythm, normal heart sounds and intact distal pulses.  Exam reveals no gallop and no friction rub.   No murmur heard. Pulmonary/Chest: Effort normal and breath sounds normal. No respiratory distress. She has no decreased breath sounds. She has no wheezes. She has no rhonchi. She has no rales. She exhibits tenderness. She exhibits no crepitus, no deformity and no retraction.    CTAB in all lung fields, no w/r/r, no hypoxia or increased WOB, speaking in full sentences, SpO2 99% on RA Chest wall with mild sternal TTP without crepitus, deformities, or retractions   Abdominal: Soft. Normal appearance and bowel sounds are normal. She exhibits no distension. There is no tenderness. There is no rigidity, no rebound, no guarding, no CVA tenderness, no tenderness at McBurney's point and negative Murphy's sign.  Soft, NTND, +BS throughout, no r/g/r, neg murphy's, neg mcburney's, no CVA TTP   Musculoskeletal: Normal range of motion.  MAE  x4 Strength and sensation grossly intact in all extremities Distal pulses intact Gait steady No pedal edema, neg homan's bilaterally   Neurological: She is alert and oriented to person, place, and time. She has normal strength. No sensory deficit.  Skin: Skin is warm, dry and intact. No rash noted.  Psychiatric: She has a normal mood and affect.  Nursing note and vitals reviewed.    ED Treatments / Results  Labs (all labs ordered are listed, but only abnormal results are displayed) Labs Reviewed  BASIC METABOLIC PANEL - Abnormal; Notable for the following:       Result Value   Potassium 3.4 (*)    Glucose, Bld 126 (*)    All other components within  normal limits  CBC - Abnormal; Notable for the following:    WBC 11.2 (*)    All other components within normal limits  BRAIN NATRIURETIC PEPTIDE  I-STAT TROPOININ, ED    EKG  EKG Interpretation  Date/Time:  Thursday Sep 25 2016 15:13:44 EDT Ventricular Rate:  88 PR Interval:  134 QRS Duration: 108 QT Interval:  370 QTC Calculation: 447 R Axis:   40 Text Interpretation:  Normal sinus rhythm Cannot rule out Anterior infarct , age undetermined Non-specific intra-ventricular conduction delay similar to previous from 2016 Confirmed by KOHUT  MD, Jewett [phone]) on 09/25/2016 5:10:10 PM       Radiology Dg Chest 2 View  Result Date: 09/25/2016 CLINICAL DATA:  Chest pain since yesterday. The pain radiates into the left shoulder, neck and upper back. Fatigue and shortness of breath. Smoker. EXAM: CHEST  2 VIEW COMPARISON:  Low-dose screening chest CT dated 08/01/2016. Chest radiographs dated 06/30/2016. FINDINGS: Stable normal sized heart and tortuous and partially calcified thoracic aorta. The lungs remain clear with stable hyperinflation and prominent interstitial markings with diffuse peribronchial thickening. Thoracic spine degenerative changes. IMPRESSION: No acute abnormality. Stable changes of COPD and chronic bronchitis.  Electronically Signed   By: Claudie Revering M.D.   On: 09/25/2016 15:49   Ct Angio Chest Pe W Or Wo Contrast  Result Date: 09/25/2016 CLINICAL DATA:  Centralized chest pain radiating to the back and shortness of breath since yesterday. Smoker. EXAM: CT ANGIOGRAPHY CHEST WITH CONTRAST TECHNIQUE: Multidetector CT imaging of the chest was performed using the standard protocol during bolus administration of intravenous contrast. Multiplanar CT image reconstructions and MIPs were obtained to evaluate the vascular anatomy. CONTRAST:  70 cc Isovue 370 COMPARISON:  Chest radiographs obtained earlier today. Low dose chest CT dated 08/01/2016. FINDINGS: Cardiovascular: Atheromatous arterial calcifications, including the aorta and coronary arteries. Normal sized heart. Normally opacified pulmonary arteries with no pulmonary arterial filling defects seen. Mediastinum/Nodes: No enlarged mediastinal, hilar, or axillary lymph nodes. Thyroid gland, trachea, and esophagus demonstrate no significant findings. Lungs/Pleura: Mild hyperexpansion of the lungs with mildly prominent interstitial markings and bilateral lower lobe subpleural bullous changes. No lung nodules or pleural fluid. Upper Abdomen: Unremarkable. Musculoskeletal: Thoracic spine degenerative changes. Review of the MIP images confirms the above findings. IMPRESSION: 1. No pulmonary emboli or acute abnormality. 2. Mild changes of COPD. Electronically Signed   By: Claudie Revering M.D.   On: 09/25/2016 19:00    CT chest low-dose w/o contrast 08/01/16: IMPRESSION: 1. Lung-RADS Category 1, negative. Continue annual screening with low-dose chest CT without contrast in 12 months. 2. Borderline ascending aortic aneurysm. Recommend annual imaging followup by CTA or MRA. This recommendation follows 2010 ACCF/AHA/AATS/ACR/ASA/SCA/SCAI/SIR/STS/SVM Guidelines for the Diagnosis and Management of Patients with Thoracic Aortic Disease. Circulation. 2010; 121: V893-Y101. 3.  Aortic atherosclerosis (ICD10-170.0). Coronary artery calcification.   Electronically Signed   By: Lorin Picket M.D.   On: 08/04/2016 07:57   Procedures Procedures (including critical care time)  Exercise Stress Test 07/17/16: Notes recorded by Minus Breeding, MD on 07/20/2016 at 9:30 PM EDT This was a negative test but it was submaximal. I could not judge ischemia at a higher level of exercise. However, she has not wanted any other stress testing. No change in therapy is indicated. She should see me in the office in two months to discuss any ongoing symptoms or sooner if needed.  Call Ms. Medico with the results and send results to Lillard Anes, NP   Study Highlights:  Blood pressure demonstrated a normal response to exercise.  There was no ST segment deviation noted during stress.   Non diagnostic ETT Patient achieved only 65% PMHR ECG normal with exercise  Impaired exercise capacity     Medications Ordered in ED Medications  aspirin chewable tablet 243 mg (243 mg Oral Given 09/25/16 1752)  fentaNYL (SUBLIMAZE) injection 50 mcg (50 mcg Intravenous Given 09/25/16 1752)  ondansetron (ZOFRAN) injection 4 mg (4 mg Intravenous Given 09/25/16 1752)  gi cocktail (Maalox,Lidocaine,Donnatal) (30 mLs Oral Given 09/25/16 1752)  iopamidol (ISOVUE-370) 76 % injection (100 mLs  Contrast Given 09/25/16 1840)     Initial Impression / Assessment and Plan / ED Course  I have reviewed the triage vital signs and the nursing notes.  Pertinent labs & imaging results that were available during my care of the patient were reviewed by me and considered in my medical decision making (see chart for details).     61 y.o. female here with 1 day of nausea and CP that began at rest, worsens with exertion; also with several months of worsening SOB, wheezing, chronic cough with yellowish sputum production (unchanged), and 4 pillow orthopnea. Has seen multiple specialists, including pulmonologist Dr.  Creig Hines (low-dose chest CT w/o contrast showed borderline ascending aortic aneurysm, low-risk scan for cancer, and atherosclerosis) and cardiologist Dr. Percival Spanish (exercise stress test nondiagnostic due to impaired ability to reach maximal heart rate; refused further testing, wanted to f/up with different cardiologist instead). Today she states her SOB is worse, and the chest pain developed yesterday. On exam, mild sternal chest tenderness, lungs clear without rhonchi or wheezing (pt just took inhalers), no abdominal tenderness, no LE swelling, no tachycardia or hypoxia. Labs: troponin neg, BMP with K 3.4 but doubt need for repletion, CBC with very mildly elevated WBC 11.2, CXR with chronic COPD findings but no acute findings. EKG without acute ischemic findings, similar to prior EKG. Will give ASA 243mg  since she took 81mg  this AM; and give fentanyl, zofran, and GI cocktail. Given CT recently with borderline aortic aneurysm, and worsening SOB in smoker. No other RFs for PE, but given hx, would like to eval for dissection or PE. She may need to be admitted for ACS r/o, given her exertional chest pain, but will reassess after remainder of testing. Will also get BNP then reassess shortly. Doubt need for breathing tx given that she already took her home inhalers.   7:28 PM BNP WNL. CT chest unremarkable. Given moderate-high risk, and exertional CP, recommended admission for ACS r/o (Discussed case with my attending Dr. Wilson Singer who agreed with plan); however, pt adamantly refuses to stay and declines admission. Discussed r/b/a, and pt continues to want to leave and refuses admission. Will have her sign out AMA. Advised continued use of her home meds, stay hydrated, get rest, use home inhalers, stop smoking, rx for zofran given, advised f/up with PCP and cardiologist tomorrow for ongoing evaluation and management. Strict return precautions advised. Pt stable at time of discharge, leaving AMA.   Final Clinical  Impressions(s) / ED Diagnoses   Final diagnoses:  Chest pain on exertion  Dyspnea on exertion  SOB (shortness of breath)  Nausea  Orthopnea  Tobacco user    New Prescriptions New Prescriptions   ONDANSETRON (ZOFRAN ODT) 4 MG DISINTEGRATING TABLET    Take 1 tablet (4 mg total) by mouth every 8 (eight) hours as needed for nausea or vomiting.     [address] [place], [place] 09/25/16 1930    Wilson Singer,  Annie Main, MD 10/06/16 2481

## 2016-09-26 ENCOUNTER — Telehealth: Payer: Self-pay | Admitting: Cardiology

## 2016-09-26 NOTE — Telephone Encounter (Signed)
Patient made aware a scheduler will reach out to schedule appt.

## 2016-09-26 NOTE — Telephone Encounter (Signed)
New message   Pt verbalized she is calling for the rn after hospital visit 09-25-2016

## 2016-09-29 ENCOUNTER — Telehealth: Payer: Self-pay | Admitting: Cardiology

## 2016-09-29 NOTE — Telephone Encounter (Signed)
Patient was scheduled Fri, 10/03/16 for PFT/OV...ert

## 2016-09-29 NOTE — Telephone Encounter (Signed)
Noted. Will close encounter. Nothing further needed.  

## 2016-09-29 NOTE — Telephone Encounter (Signed)
New message     Pt requesting a transfer from [facility] cardiology to [facility] cardiology , pt does not wish not continue care with Dr Percival Spanish

## 2016-09-30 NOTE — Telephone Encounter (Signed)
OK with me.

## 2016-10-01 NOTE — Telephone Encounter (Signed)
See telephone note-patient will no longer see Dr. Percival Spanish.

## 2016-10-03 ENCOUNTER — Ambulatory Visit: Payer: BLUE CROSS/BLUE SHIELD | Admitting: Pulmonary Disease

## 2016-10-03 ENCOUNTER — Ambulatory Visit (INDEPENDENT_AMBULATORY_CARE_PROVIDER_SITE_OTHER): Payer: BLUE CROSS/BLUE SHIELD | Admitting: Pulmonary Disease

## 2016-10-03 DIAGNOSIS — R0609 Other forms of dyspnea: Secondary | ICD-10-CM

## 2016-10-03 LAB — PULMONARY FUNCTION TEST
DL/VA % pred: 86 %
DL/VA: 3.79 ml/min/mmHg/L
DLCO COR % PRED: 90 %
DLCO UNC: 17.08 ml/min/mmHg
DLCO cor: 18.24 ml/min/mmHg
DLCO unc % pred: 84 %
FEF 25-75 Post: 1.79 L/sec
FEF 25-75 Pre: 1.71 L/sec
FEF2575-%CHANGE-POST: 4 %
FEF2575-%PRED-POST: 81 %
FEF2575-%Pred-Pre: 78 %
FEV1-%Change-Post: 0 %
FEV1-%PRED-POST: 88 %
FEV1-%Pred-Pre: 88 %
FEV1-POST: 2.01 L
FEV1-Pre: 2.02 L
FEV1FVC-%CHANGE-POST: 1 %
FEV1FVC-%Pred-Pre: 99 %
FEV6-%Change-Post: -1 %
FEV6-%PRED-PRE: 91 %
FEV6-%Pred-Post: 90 %
FEV6-PRE: 2.61 L
FEV6-Post: 2.57 L
FEV6FVC-%Change-Post: 0 %
FEV6FVC-%PRED-PRE: 104 %
FEV6FVC-%Pred-Post: 103 %
FVC-%Change-Post: -1 %
FVC-%Pred-Post: 87 %
FVC-%Pred-Pre: 88 %
FVC-PRE: 2.61 L
FVC-Post: 2.57 L
POST FEV1/FVC RATIO: 78 %
POST FEV6/FVC RATIO: 100 %
Pre FEV1/FVC ratio: 77 %
Pre FEV6/FVC Ratio: 100 %
RV % PRED: 136 %
RV: 2.51 L
TLC % pred: 117 %
TLC: 5.38 L

## 2016-10-03 NOTE — Progress Notes (Signed)
PFT done today 10/03/16.

## 2016-10-08 DIAGNOSIS — R490 Dysphonia: Secondary | ICD-10-CM | POA: Insufficient documentation

## 2016-10-08 DIAGNOSIS — K219 Gastro-esophageal reflux disease without esophagitis: Secondary | ICD-10-CM | POA: Insufficient documentation

## 2016-10-13 ENCOUNTER — Telehealth: Payer: Self-pay | Admitting: Pulmonary Disease

## 2016-10-13 MED ORDER — UMECLIDINIUM-VILANTEROL 62.5-25 MCG/INH IN AEPB: 1.0000 | INHALATION_SPRAY | Freq: Every day | RESPIRATORY_TRACT | 5 refills | Status: DC

## 2016-10-13 NOTE — Telephone Encounter (Signed)
Spoke with pt. She is requesting a prescription for Anoro to be sent to her pharmacy. Rx has been sent in. Nothing further was needed.

## 2016-10-14 ENCOUNTER — Other Ambulatory Visit: Payer: Self-pay | Admitting: Otolaryngology

## 2016-10-22 ENCOUNTER — Encounter: Payer: Self-pay | Admitting: Adult Health

## 2016-10-22 ENCOUNTER — Ambulatory Visit (INDEPENDENT_AMBULATORY_CARE_PROVIDER_SITE_OTHER): Payer: BLUE CROSS/BLUE SHIELD | Admitting: Adult Health

## 2016-10-22 DIAGNOSIS — F17200 Nicotine dependence, unspecified, uncomplicated: Secondary | ICD-10-CM

## 2016-10-22 DIAGNOSIS — E784 Other hyperlipidemia: Secondary | ICD-10-CM

## 2016-10-22 DIAGNOSIS — J381 Polyp of vocal cord and larynx: Secondary | ICD-10-CM

## 2016-10-22 DIAGNOSIS — I10 Essential (primary) hypertension: Secondary | ICD-10-CM | POA: Diagnosis not present

## 2016-10-22 DIAGNOSIS — R5383 Other fatigue: Secondary | ICD-10-CM | POA: Diagnosis not present

## 2016-10-22 DIAGNOSIS — E7849 Other hyperlipidemia: Secondary | ICD-10-CM

## 2016-10-22 NOTE — Assessment & Plan Note (Signed)
Jan 2018  Ref Range & Units 70mo ago  Cholesterol, Total 100 - 199 mg/dL 179   Triglycerides 0 - 149 mg/dL 99   HDL >39 mg/dL 48   VLDL Cholesterol Cal 5 - 40 mg/dL 20   LDL Calculated 0 - 99 mg/dL 111    Chol/HDL Ratio 0.0 - 4.4 ratio units 3.7    Statin intolerant. TLC to reduce LDL

## 2016-10-22 NOTE — Progress Notes (Signed)
Subjective:    Patient ID: Heidi Foster, female    DOB: 1956/01/05, 61 y.o.   MRN: [id number]  HPI:  Heidi Foster is here for f/u-smoking cessation.  She was provided rx for Chantix, however due to cost she never filled it.  She reports compliance on her cardiac medications and only albuterol inhaler, the ANORO ELLIPTA was also cost prohibitive).    She was seen ED 09/25/16 for CP- "BNP WNL. CT chest unremarkable. Given moderate-high risk, and exertional CP, recommended admission for ACS r/o (Discussed case with my attending Dr. Wilson Singer who agreed with plan); however, pt adamantly refuses to stay and declines admission. Discussed r/b/a, and pt continues to want to leave and refuses admission. Will have her sign out AMA. Advised continued use of her home meds, stay hydrated, get rest, use home inhalers, stop smoking, rx for zofran given, advised f/up with PCP and cardiologist tomorrow for ongoing evaluation and management. Strict return precautions advised. Pt stable at time of discharge, leaving AMA." She denies any CP and increased SOB since then.  She has not followed up with current cardiologist, however has an appt 10/28/16 with new [facility] practice-great! She was seen by Otolaryngology: 10/08/16:Transnasal Laryngoscopy, impression: Bilateral vocal cord polyps.  She has MICROLARYNGOSCOPY WITH LASER scheduled 11/13/16. She was seen by pulmonology and PFT completed 10/03/16:  Interpretation: Spirometry is normal. There is no significant bronchodilator response. Lung volumes show air trapping. Carbon monoxide diffusion capacity corrected for hemoglobin was normal. She has f/u with pulm August 2018. She would like to discuss smoking cessation, perhaps try course of Wellbutrin after larynoscopic procedure in July. She reports dramatically reducing her daily cigarette use to 4-5/day, by chewing on cherry cough gtt's.   She denies ETOH use.  She is bothered by the fact that she cannot work and she is completely  financially dependent on her son and daughter-in-law.  Patient Care Team    Relationship Specialty Notifications Start End  Esaw Grandchild, NP PCP - General Family Medicine  05/26/16     Patient Active Problem List   Diagnosis Date Noted  . 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 Centrilobular emphysema ([place]) 09/09/2016  . GERD (gastroesophageal reflux disease) 06/30/2016  . Dysphagia 06/30/2016  . Health care maintenance 06/25/2016  . Other fatigue 05/26/2016  . Hypertension 05/26/2016  . Other hyperlipidemia 05/26/2016  . Family history of diabetes mellitus in mother 05/26/2016  . Acute pain of right shoulder 05/26/2016  . Tobacco use disorder 05/26/2016  . Dyspnea on exertion 05/26/2016  . Chronic pain of right knee 05/26/2016  . History of MI (myocardial infarction) 05/26/2016  . History of TIA (transient ischemic attack) 05/26/2016  . Numbness and tingling of left lower extremity 05/26/2016  . Numbness and tingling in left hand 05/26/2016  . 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 Allergy   . Cancer ([place])    skin  . Closed fracture of right patella   . GERD (gastroesophageal reflux disease)   . Heart attack ([place]) 1999  . Hypertension   . Seborrheic keratoses      Past Surgical History:  Procedure Laterality Date  . caridac cath    . SKIN BIOPSY     nose     Family History  Problem Relation Age of Onset  . Diabetes Mother   . Hypertension Mother   . Stroke Mother   . Alcohol abuse Father   . Multiple sclerosis Sister   .  Cancer Brother        lung  . Stroke Maternal Grandmother   . Heart attack Maternal Grandmother      History  Drug Use No     History  Alcohol Use No     History  Smoking Status  . Current Every Day Smoker  . Packs/day: 0.50  . Years: 40.00  . Types: Cigarettes  . Start date: 09/27/1976  Smokeless Tobacco  . Never Used    Comment: Peak rate of 1ppd     Outpatient Encounter Prescriptions  as of 10/22/2016  Medication Sig  . albuterol (PROVENTIL HFA;VENTOLIN HFA) 108 (90 Base) MCG/ACT inhaler Inhale 2 puffs into the lungs every 4 (four) hours as needed for wheezing or shortness of breath.  Marland Kitchen amLODipine (NORVASC) 2.5 MG tablet Take 1 tablet (2.5 mg total) by mouth daily. Stop taking Amlodipine 10mg .  . aspirin EC 81 MG tablet Take 81 mg by mouth daily.  Marland Kitchen losartan (COZAAR) 100 MG tablet Take 1 tablet (100 mg total) by mouth daily.  . Multiple Vitamin (MULTIVITAMIN) tablet Take 1 tablet by mouth daily.  . ondansetron (ZOFRAN ODT) 4 MG disintegrating tablet Take 1 tablet (4 mg total) by mouth every 8 (eight) hours as needed for nausea or vomiting.  Marland Kitchen Spacer/Aero-Holding Chambers (AEROCHAMBER MV) inhaler Use as instructed  . verapamil (VERELAN PM) 180 MG 24 hr capsule Take 1 capsule (180 mg total) by mouth daily.  . [DISCONTINUED] umeclidinium-vilanterol (ANORO ELLIPTA) 62.5-25 MCG/INH AEPB Inhale 1 puff into the lungs daily.  . [DISCONTINUED] varenicline (CHANTIX PAK) 0.5 MG X 11 & 1 MG X 42 tablet Take one 0.5 mg tablet by mouth once daily for 3 days, then increase to one 0.5 mg tablet twice daily for 4 days, then increase to one 1 mg tablet twice daily. (Patient not taking: Reported on 10/22/2016)   No facility-administered encounter medications on file as of 10/22/2016.     Allergies: Codeine and Bupropion  Body mass index is 24.87 kg/m.  Blood pressure 131/83, pulse 70, height 5\' 2"  (1.575 m), weight 136 lb (61.7 kg).   Review of Systems  Constitutional: Positive for fatigue. Negative for activity change, appetite change, chills, diaphoresis and fever.  HENT: Positive for voice change. Negative for congestion, postnasal drip and trouble swallowing.   Eyes: Negative for visual disturbance.  Respiratory: Positive for cough and shortness of breath. Negative for chest tightness, wheezing and stridor.   Cardiovascular: Negative for chest pain, palpitations and leg swelling.    Endocrine: Negative for cold intolerance, heat intolerance, polydipsia, polyphagia and polyuria.  Genitourinary: Negative for flank pain and hematuria.  Skin: Negative for color change, pallor, rash and wound.  Neurological: Negative for dizziness, weakness and headaches.  Hematological: Does not bruise/bleed easily.  Psychiatric/Behavioral: Positive for sleep disturbance.       4 pillow orthopnea       Objective:   Physical Exam  Constitutional: She is oriented to person, place, and time. She appears well-developed and well-nourished.  HENT:  Head: Normocephalic and atraumatic.  Eyes: Conjunctivae are normal. Pupils are equal, round, and reactive to light.  Neck: Normal range of motion.  Cardiovascular: Normal rate, normal heart sounds and intact distal pulses.   No murmur heard. Pulmonary/Chest: Effort normal and breath sounds normal. No respiratory distress. She has no wheezes. She has no rales. She exhibits no tenderness.  Lymphadenopathy:    She has no cervical adenopathy.  Neurological: She is alert and oriented to person, place, and  time. Coordination normal.  Skin: Skin is warm and dry. No rash noted. No erythema. No pallor.  Psychiatric: She has a normal mood and affect. Her behavior is normal. Judgment and thought content normal.  Nursing note and vitals reviewed.         Assessment & Plan:   1. Tobacco use disorder   2. Essential hypertension   3. Vocal cord polyps   4. Other fatigue   5. Other hyperlipidemia     Tobacco use disorder Never filled Chantix rx. Declined new rx for either Chantix or Wellbutrin. Down to 4-5 cigarettes/day with help of "chewing cherry cough gtt's".  Hypertension BP at goal  131/83 Continue Verapamil 180mg  daily, Losartan 100mg  daily, Amlodipine 2.5mg  daily. She has drastically reduced tobacco use, estimates 4-5 cigarettes/day. Continue to reduce Na+ intake.   Vocal cord polyps MICROLARYNGOSCOPY WITH LASER scheduled  11/13/16.  Other fatigue 4 pillow orthopnea   Other hyperlipidemia Jan 2018  Ref Range & Units 21mo ago  Cholesterol, Total 100 - 199 mg/dL 179   Triglycerides 0 - 149 mg/dL 99   HDL >39 mg/dL 48   VLDL Cholesterol Cal 5 - 40 mg/dL 20   LDL Calculated 0 - 99 mg/dL 111    Chol/HDL Ratio 0.0 - 4.4 ratio units 3.7    Statin intolerant. TLC to reduce LDL    FOLLOW-UP:  Return in about 3 months (around 01/22/2017) for Regular Follow Up.

## 2016-10-22 NOTE — Assessment & Plan Note (Signed)
4 pillow orthopnea

## 2016-10-22 NOTE — Assessment & Plan Note (Signed)
BP at goal  131/83 Continue Verapamil 180mg  daily, Losartan 100mg  daily, Amlodipine 2.5mg  daily. She has drastically reduced tobacco use, estimates 4-5 cigarettes/day. Continue to reduce Na+ intake.

## 2016-10-22 NOTE — Patient Instructions (Signed)
Tobacco Use Disorder Tobacco use disorder (TUD) is a mental disorder. It is the long-term use of tobacco in spite of related health problems or difficulty with normal life activities. Tobacco is most commonly smoked as cigarettes and less commonly as cigars or pipes. Smokeless chewing tobacco and snuff are also popular. People with TUD get a feeling of extreme pleasure (euphoria) from using tobacco and have a desire to use it again and again. Repeated use of tobacco can cause problems. The addictive effects of tobacco are due mainly tothe ingredient nicotine. Nicotine also causes a rush of adrenaline (epinephrine) in the body. This leads to increased blood pressure, heart rate, and breathing rate. These changes may cause problems for people with high blood pressure, weak hearts, or lung disease. High doses of nicotine in children and pets can lead to seizures and death. Tobacco contains a number of other unsafe chemicals. These chemicals are especially harmful when inhaled as smoke and can damage almost every organ in the body. Smokers live shorter lives than nonsmokers and are at risk of dying from a number of diseases and cancers. Tobacco smoke can also cause health problems for nonsmokers (due to inhaling secondhand smoke). Smoking is also a fire hazard. TUD usually starts in the late teenage years and is most common in young adults between the ages of 18 and 25 years. People who start smoking earlier in life are more likely to continue smoking as adults. TUD is somewhat more common in men than women. People with TUD are at higher risk for using alcohol and other drugs of abuse. What increases the risk? Risk factors for TUD include:  Having family members with the disorder.  Being around people who use tobacco.  Having an existing mental health issue such as schizophrenia, depression, bipolar disorder, ADHD, or posttraumatic stress disorder (PTSD).  What are the signs or symptoms? People with  tobacco use disorder have two or more of the following signs and symptoms within 12 months:  Use of more tobacco over a longer period than intended.  Not able to cut down or control tobacco use.  A lot of time spent obtaining or using tobacco.  Strong desire or urge to use tobacco (craving). Cravings may last for 6 months or longer after quitting.  Use of tobacco even when use leads to major problems at work, school, or home.  Use of tobacco even when use leads to relationship problems.  Giving up or cutting down on important life activities because of tobacco use.  Repeatedly using tobacco in situations where it puts you or others in physical danger, like smoking in bed.  Use of tobacco even when it is known that a physical or mental problem is likely related to tobacco use. ? Physical problems are numerous and may include chronic bronchitis, emphysema, lung and other cancers, gum disease, high blood pressure, heart disease, and stroke. ? Mental problems caused by tobacco may include difficulty sleeping and anxiety.  Need to use greater amounts of tobacco to get the same effect. This means you have developed a tolerance.  Withdrawal symptoms as a result of stopping or rapidly cutting back use. These symptoms may last a month or more after quitting and include the following: ? Depressed, anxious, or irritable mood. ? Difficulty concentrating. ? Increased appetite. ? Restlessness or trouble sleeping. ? Use of tobacco to avoid withdrawal symptoms.  How is this diagnosed? Tobacco use disorder is diagnosed by your health care provider. A diagnosis may be made by:    Your health care provider asking questions about your tobacco use and any problems it may be causing.  A physical exam.  Lab tests.  You may be referred to a mental health professional or addiction specialist.  The severity of tobacco use disorder depends on the number of signs and symptoms you have:  Mild-Two or  three symptoms.  Moderate-Four or five symptoms.  Severe-Six or more symptoms.  How is this treated? Many people with tobacco use disorder are unable to quit on their own and need help. Treatment options include the following:  Nicotine replacement therapy (NRT). NRT provides nicotine without the other harmful chemicals in tobacco. NRT gradually lowers the dosage of nicotine in the body and reduces withdrawal symptoms. NRT is available in over-the-counter forms (gum, lozenges, and skin patches) as well as prescription forms (mouth inhaler and nasal spray).  Medicines.This may include: ? Antidepressant medicine that may reduce nicotine cravings. ? A medicine that acts on nicotine receptors in the brain to reduce cravings and withdrawal symptoms. It may also block the effects of tobacco in people with TUD who relapse.  Counseling or talk therapy. A form of talk therapy called behavioral therapy is commonly used to treat people with TUD. Behavioral therapy looks at triggers for tobacco use, how to avoid them, and how to cope with cravings. It is most effective in person or by phone but is also available in self-help forms (books and Internet websites).  Support groups. These provide emotional support, advice, and guidance for quitting tobacco.  The most effective treatment for TUD is usually a combination of medicine, talk therapy, and support groups. Follow these instructions at home:  Keep all follow-up visits as directed by your health care provider. This is important.  Take medicines only as directed by your health care provider.  Check with your health care provider before starting new prescription or over-the-counter medicines. Contact a health care provider if:  You are not able to take your medicines as prescribed.  Treatment is not helping your TUD and your symptoms get worse. Get help right away if:  You have serious thoughts about hurting yourself or others.  You have  trouble breathing, chest pain, sudden weakness, or sudden numbness in part of your body. This information is not intended to replace advice given to you by your health care provider. Make sure you discuss any questions you have with your health care provider. Document Released: 12/19/2003 Document Revised: 12/16/2015 Document Reviewed: 06/10/2013 Elsevier Interactive Patient Education  2018 [employer].   Hypertension Hypertension, commonly called high blood pressure, is when the force of blood pumping through the arteries is too strong. The arteries are the blood vessels that carry blood from the heart throughout the body. Hypertension forces the heart to work harder to pump blood and may cause arteries to become narrow or stiff. Having untreated or uncontrolled hypertension can cause heart attacks, strokes, kidney disease, and other problems. A blood pressure reading consists of a higher number over a lower number. Ideally, your blood pressure should be below 120/80. The first ("top") number is called the systolic pressure. It is a measure of the pressure in your arteries as your heart beats. The second ("bottom") number is called the diastolic pressure. It is a measure of the pressure in your arteries as the heart relaxes. What are the causes? The cause of this condition is not known. What increases the risk? Some risk factors for high blood pressure are under your control. Others are not.  Factors you can change  Smoking.  Having type 2 diabetes mellitus, high cholesterol, or both.  Not getting enough exercise or physical activity.  Being overweight.  Having too much fat, sugar, calories, or salt (sodium) in your diet.  Drinking too much alcohol. Factors that are difficult or impossible to change  Having chronic kidney disease.  Having a family history of high blood pressure.  Age. Risk increases with age.  Race. You may be at higher risk if you are African-American.  Gender.  Men are at higher risk than women before age 8. After age 65, women are at higher risk than men.  Having obstructive sleep apnea.  Stress. What are the signs or symptoms? Extremely high blood pressure (hypertensive crisis) may cause:  Headache.  Anxiety.  Shortness of breath.  Nosebleed.  Nausea and vomiting.  Severe chest pain.  Jerky movements you cannot control (seizures).  How is this diagnosed? This condition is diagnosed by measuring your blood pressure while you are seated, with your arm resting on a surface. The cuff of the blood pressure monitor will be placed directly against the skin of your upper arm at the level of your heart. It should be measured at least twice using the same arm. Certain conditions can cause a difference in blood pressure between your right and left arms. Certain factors can cause blood pressure readings to be lower or higher than normal (elevated) for a short period of time:  When your blood pressure is higher when you are in a health care provider's office than when you are at home, this is called white coat hypertension. Most people with this condition do not need medicines.  When your blood pressure is higher at home than when you are in a health care provider's office, this is called masked hypertension. Most people with this condition may need medicines to control blood pressure.  If you have a high blood pressure reading during one visit or you have normal blood pressure with other risk factors:  You may be asked to return on a different day to have your blood pressure checked again.  You may be asked to monitor your blood pressure at home for 1 week or longer.  If you are diagnosed with hypertension, you may have other blood or imaging tests to help your health care provider understand your overall risk for other conditions. How is this treated? This condition is treated by making healthy lifestyle changes, such as eating healthy foods,  exercising more, and reducing your alcohol intake. Your health care provider may prescribe medicine if lifestyle changes are not enough to get your blood pressure under control, and if:  Your systolic blood pressure is above 130.  Your diastolic blood pressure is above 80.  Your personal target blood pressure may vary depending on your medical conditions, your age, and other factors. Follow these instructions at home: Eating and drinking  Eat a diet that is high in fiber and potassium, and low in sodium, added sugar, and fat. An example eating plan is called the DASH (Dietary Approaches to Stop Hypertension) diet. To eat this way: ? Eat plenty of fresh fruits and vegetables. Try to fill half of your plate at each meal with fruits and vegetables. ? Eat whole grains, such as whole wheat pasta, brown rice, or whole grain bread. Fill about one quarter of your plate with whole grains. ? Eat or drink low-fat dairy products, such as skim milk or low-fat yogurt. ? Avoid fatty cuts  of meat, processed or cured meats, and poultry with skin. Fill about one quarter of your plate with lean proteins, such as fish, chicken without skin, beans, eggs, and tofu. ? Avoid premade and processed foods. These tend to be higher in sodium, added sugar, and fat.  Reduce your daily sodium intake. Most people with hypertension should eat less than 1,500 mg of sodium a day.  Limit alcohol intake to no more than 1 drink a day for nonpregnant women and 2 drinks a day for men. One drink equals 12 oz of beer, 5 oz of wine, or 1 oz of hard liquor. Lifestyle  Work with your health care provider to maintain a healthy body weight or to lose weight. Ask what an ideal weight is for you.  Get at least 30 minutes of exercise that causes your heart to beat faster (aerobic exercise) most days of the week. Activities may include walking, swimming, or biking.  Include exercise to strengthen your muscles (resistance exercise), such as  pilates or lifting weights, as part of your weekly exercise routine. Try to do these types of exercises for 30 minutes at least 3 days a week.  Do not use any products that contain nicotine or tobacco, such as cigarettes and e-cigarettes. If you need help quitting, ask your health care provider.  Monitor your blood pressure at home as told by your health care provider.  Keep all follow-up visits as told by your health care provider. This is important. Medicines  Take over-the-counter and prescription medicines only as told by your health care provider. Follow directions carefully. Blood pressure medicines must be taken as prescribed.  Do not skip doses of blood pressure medicine. Doing this puts you at risk for problems and can make the medicine less effective.  Ask your health care provider about side effects or reactions to medicines that you should watch for. Contact a health care provider if:  You think you are having a reaction to a medicine you are taking.  You have headaches that keep coming back (recurring).  You feel dizzy.  You have swelling in your ankles.  You have trouble with your vision. Get help right away if:  You develop a severe headache or confusion.  You have unusual weakness or numbness.  You feel faint.  You have severe pain in your chest or abdomen.  You vomit repeatedly.  You have trouble breathing. Summary  Hypertension is when the force of blood pumping through your arteries is too strong. If this condition is not controlled, it may put you at risk for serious complications.  Your personal target blood pressure may vary depending on your medical conditions, your age, and other factors. For most people, a normal blood pressure is less than 120/80.  Hypertension is treated with lifestyle changes, medicines, or a combination of both. Lifestyle changes include weight loss, eating a healthy, low-sodium diet, exercising more, and limiting  alcohol. This information is not intended to replace advice given to you by your health care provider. Make sure you discuss any questions you have with your health care provider. Document Released: 04/14/2005 Document Revised: 03/12/2016 Document Reviewed: 03/12/2016 Elsevier Interactive Patient Education  2018 [employer].   Heart-Healthy Eating Plan Many factors influence your heart health, including eating and exercise habits. Heart (coronary) risk increases with abnormal blood fat (lipid) levels. Heart-healthy meal planning includes limiting unhealthy fats, increasing healthy fats, and making other small dietary changes. This includes maintaining a healthy body weight to help keep  lipid levels within a normal range. What is my plan? Your health care provider recommends that you:  Get no more than _________% of the total calories in your daily diet from fat.  Limit your intake of saturated fat to less than _________% of your total calories each day.  Limit the amount of cholesterol in your diet to less than _________ mg per day.  What types of fat should I choose?  Choose healthy fats more often. Choose monounsaturated and polyunsaturated fats, such as olive oil and canola oil, flaxseeds, walnuts, almonds, and seeds.  Eat more omega-3 fats. Good choices include salmon, mackerel, sardines, tuna, flaxseed oil, and ground flaxseeds. Aim to eat fish at least two times each week.  Limit saturated fats. Saturated fats are primarily found in animal products, such as meats, butter, and cream. Plant sources of saturated fats include palm oil, palm kernel oil, and coconut oil.  Avoid foods with partially hydrogenated oils in them. These contain trans fats. Examples of foods that contain trans fats are stick margarine, some tub margarines, cookies, crackers, and other baked goods. What general guidelines do I need to follow?  Check food labels carefully to identify foods with trans fats or  high amounts of saturated fat.  Fill one half of your plate with vegetables and green salads. Eat 4-5 servings of vegetables per day. A serving of vegetables equals 1 cup of raw leafy vegetables,  cup of raw or cooked cut-up vegetables, or  cup of vegetable juice.  Fill one fourth of your plate with whole grains. Look for the word "whole" as the first word in the ingredient list.  Fill one fourth of your plate with lean protein foods.  Eat 4-5 servings of fruit per day. A serving of fruit equals one medium whole fruit,  cup of dried fruit,  cup of fresh, frozen, or canned fruit, or  cup of 100% fruit juice.  Eat more foods that contain soluble fiber. Examples of foods that contain this type of fiber are apples, broccoli, carrots, beans, peas, and barley. Aim to get 20-30 g of fiber per day.  Eat more home-cooked food and less restaurant, buffet, and fast food.  Limit or avoid alcohol.  Limit foods that are high in starch and sugar.  Avoid fried foods.  Cook foods by using methods other than frying. Baking, boiling, grilling, and broiling are all great options. Other fat-reducing suggestions include: ? Removing the skin from poultry. ? Removing all visible fats from meats. ? Skimming the fat off of stews, soups, and gravies before serving them. ? Steaming vegetables in water or broth.  Lose weight if you are overweight. Losing just 5-10% of your initial body weight can help your overall health and prevent diseases such as diabetes and heart disease.  Increase your consumption of nuts, legumes, and seeds to 4-5 servings per week. One serving of dried beans or legumes equals  cup after being cooked, one serving of nuts equals 1 ounces, and one serving of seeds equals  ounce or 1 tablespoon.  You may need to monitor your salt (sodium) intake, especially if you have high blood pressure. Talk with your health care provider or dietitian to get more information about reducing  sodium. What foods can I eat? Grains  Breads, including [place], white, pita, wheat, raisin, rye, oatmeal, and [place]. Tortillas that are neither fried nor made with lard or trans fat. Low-fat rolls, including hotdog and hamburger buns and English muffins. Biscuits. Muffins. Waffles. Pancakes.  Light popcorn. Whole-grain cereals. Flatbread. Melba toast. Pretzels. Breadsticks. Rusks. Low-fat snacks and crackers, including oyster, saltine, matzo, graham, animal, and rye. Rice and pasta, including brown rice and those that are made with whole wheat. Vegetables All vegetables. Fruits All fruits, but limit coconut. Meats and Other Protein Sources Lean, well-trimmed beef, veal, pork, and lamb. Chicken and [place] without skin. All fish and shellfish. Wild duck, rabbit, pheasant, and venison. Egg whites or low-cholesterol egg substitutes. Dried beans, peas, lentils, and tofu.Seeds and most nuts. Dairy Low-fat or nonfat cheeses, including ricotta, string, and mozzarella. Skim or 1% milk that is liquid, powdered, or evaporated. Buttermilk that is made with low-fat milk. Nonfat or low-fat yogurt. Beverages Mineral water. Diet carbonated beverages. Sweets and Desserts Sherbets and fruit ices. Honey, jam, marmalade, jelly, and syrups. Meringues and gelatins. Pure sugar candy, such as hard candy, jelly beans, gumdrops, mints, marshmallows, and small amounts of dark chocolate. [employer] Eat all sweets and desserts in moderation. Fats and Oils Nonhydrogenated (trans-free) margarines. Vegetable oils, including soybean, sesame, sunflower, olive, peanut, safflower, corn, canola, and cottonseed. Salad dressings or mayonnaise that are made with a vegetable oil. Limit added fats and oils that you use for cooking, baking, salads, and as spreads. Other Cocoa powder. Coffee and tea. All seasonings and condiments. The items listed above may not be a complete list of recommended foods or beverages. Contact your  dietitian for more options. What foods are not recommended? Grains Breads that are made with saturated or trans fats, oils, or whole milk. Croissants. Butter rolls. Cheese breads. Sweet rolls. Donuts. Buttered popcorn. Chow mein noodles. High-fat crackers, such as cheese or butter crackers. Meats and Other Protein Sources Fatty meats, such as hotdogs, short ribs, sausage, spareribs, bacon, ribeye roast or steak, and mutton. High-fat deli meats, such as salami and bologna. Caviar. Domestic duck and goose. Organ meats, such as kidney, liver, sweetbreads, brains, gizzard, chitterlings, and heart. Dairy Cream, sour cream, cream cheese, and creamed cottage cheese. Whole milk cheeses, including blue (bleu), [place], [place], [place], [place], [place], [place], [place], [place], and [place]. Whole or 2% milk that is liquid, evaporated, or condensed. Whole buttermilk. Cream sauce or high-fat cheese sauce. Yogurt that is made from whole milk. Beverages Regular sodas and drinks with added sugar. Sweets and Desserts Frosting. Pudding. Cookies. Cakes other than angel food cake. Candy that has milk chocolate or white chocolate, hydrogenated fat, butter, coconut, or unknown ingredients. Buttered syrups. Full-fat ice cream or ice cream drinks. Fats and Oils Gravy that has suet, meat fat, or shortening. Cocoa butter, hydrogenated oils, palm oil, coconut oil, palm kernel oil. These can often be found in baked products, candy, fried foods, nondairy creamers, and whipped toppings. Solid fats and shortenings, including bacon fat, salt pork, lard, and butter. Nondairy cream substitutes, such as coffee creamers and sour cream substitutes. Salad dressings that are made of unknown oils, cheese, or sour cream. The items listed above may not be a complete list of foods and beverages to avoid. Contact your dietitian for more information. This information is not intended to replace advice given to you by your health care  provider. Make sure you discuss any questions you have with your health care provider. Document Released: 01/22/2008 Document Revised: 11/02/2015 Document Reviewed: 10/06/2013 Elsevier Interactive Patient Education  2017 [place] all medications as directed. Continue excellent hydration and follow heart healthy diet.  Continue to reduce tobacco use to achieve full cessation-YOU CAN DO IT! Please keep cardiology  appt 10/28/16 Follow-up in 3 months, sooner if needed.

## 2016-10-22 NOTE — Assessment & Plan Note (Signed)
MICROLARYNGOSCOPY WITH LASER scheduled 11/13/16.

## 2016-10-22 NOTE — Assessment & Plan Note (Signed)
Never filled Chantix rx. Declined new rx for either Chantix or Wellbutrin. Down to 4-5 cigarettes/day with help of "chewing cherry cough gtt's".

## 2016-10-26 HISTORY — PX: OTHER SURGICAL HISTORY: SHX169

## 2016-10-28 ENCOUNTER — Ambulatory Visit (INDEPENDENT_AMBULATORY_CARE_PROVIDER_SITE_OTHER): Payer: BLUE CROSS/BLUE SHIELD | Admitting: Cardiovascular Disease

## 2016-10-28 ENCOUNTER — Encounter: Payer: Self-pay | Admitting: Cardiovascular Disease

## 2016-10-28 VITALS — BP 132/74 | HR 82 | Ht 61.0 in | Wt 134.0 lb

## 2016-10-28 DIAGNOSIS — F17200 Nicotine dependence, unspecified, uncomplicated: Secondary | ICD-10-CM | POA: Diagnosis not present

## 2016-10-28 DIAGNOSIS — I10 Essential (primary) hypertension: Secondary | ICD-10-CM

## 2016-10-28 DIAGNOSIS — E784 Other hyperlipidemia: Secondary | ICD-10-CM | POA: Diagnosis not present

## 2016-10-28 DIAGNOSIS — E7849 Other hyperlipidemia: Secondary | ICD-10-CM

## 2016-10-28 DIAGNOSIS — I252 Old myocardial infarction: Secondary | ICD-10-CM

## 2016-10-28 NOTE — Patient Instructions (Signed)

## 2016-10-28 NOTE — Assessment & Plan Note (Addendum)
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 She had a femoral artery catheterization but no intervention was performed at that time. She had a routine GXT performed at the request of Dr. Percival Spanish 07/15/16 which was normal. She does have COPD and is short of breath but denies chest pain or back pain currently. She is scheduled for a vocal cord polyp procedure in the next several weeks which she will be cleared for from a cardiac cardiac point of view.

## 2016-10-28 NOTE — Assessment & Plan Note (Signed)
History of hypertension blood pressure measured 132/74. She is on losartan, amlodipine and verapamil for unclear reasons of these seem to be effective. Continue current meds at current dosing

## 2016-10-28 NOTE — Progress Notes (Signed)
10/28/2016 Heidi Foster   1956-03-23  [id number]  Primary Physician Danford, Berna Spare, NP Primary Cardiologist: Lorretta Harp MD Renae Gloss  HPI:  Heidi Foster is a very pleasant 61 year old thin-appearing divorced Caucasian female mother of 35 14 year old daughter, grandmother to 59 year old grandchild referred by Laural Benes, nurse practitioner for cardiovascular evaluation. She has a history of treated hypertension and tobacco abuse. She apparently had a heart attack in the year 2000 and the [place] and underwent cardiac catheterization but no intervention was performed. She does have COPD as well. She gets occasional back and chest pain but none recently. A routine GXT ordered by Dr. Percival Spanish on 07/15/16 was nonischemic. She scheduled for vocal cord surgery on 7/18.   Current Outpatient Prescriptions  Medication Sig Dispense Refill  . albuterol (PROVENTIL HFA;VENTOLIN HFA) 108 (90 Base) MCG/ACT inhaler Inhale 2 puffs into the lungs every 4 (four) hours as needed for wheezing or shortness of breath. 1 Inhaler 3  . amLODipine (NORVASC) 2.5 MG tablet Take 1 tablet (2.5 mg total) by mouth daily. Stop taking Amlodipine 10mg . 90 tablet 2  . aspirin EC 81 MG tablet Take 81 mg by mouth daily.    Marland Kitchen losartan (COZAAR) 100 MG tablet Take 1 tablet (100 mg total) by mouth daily. 90 tablet 0  . Multiple Vitamin (MULTIVITAMIN) tablet Take 1 tablet by mouth daily.    . ondansetron (ZOFRAN ODT) 4 MG disintegrating tablet Take 1 tablet (4 mg total) by mouth every 8 (eight) hours as needed for nausea or vomiting. 15 tablet 0  . Spacer/Aero-Holding Chambers (AEROCHAMBER MV) inhaler Use as instructed 1 each 0  . verapamil (VERELAN PM) 180 MG 24 hr capsule Take 1 capsule (180 mg total) by mouth daily. 90 capsule 0   No current facility-administered medications for this visit.     Allergies  Allergen Reactions  . Codeine Anaphylaxis  . Bupropion Other (See Comments)    Anger and mental issues     Social History   Social History  . Marital status: Divorced    Spouse name: N/A  . Number of children: N/A  . Years of education: N/A   Occupational History  . Not on file.   Social History Main Topics  . Smoking status: Current Every Day Smoker    Packs/day: 0.50    Years: 40.00    Types: Cigarettes    Start date: 09/27/1976  . Smokeless tobacco: Never Used     Comment: Peak rate of 1ppd  . Alcohol use No  . Drug use: No  . Sexual activity: No   Other Topics Concern  . Not on file   Social History Narrative   [facility] (06/30/16):   Originally from [place]. Moved to [facility] in April 2016. 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 Currently lives with her daughter & son-in-law in a mobile home. Has a cat currently. No mold or TB exposure. Always had a negative skin PPD. Previously enjoyed crocheting.      Review of Systems: General: negative for chills, fever, night sweats or weight changes.  Cardiovascular: negative for chest pain, dyspnea on exertion, edema, orthopnea, palpitations, paroxysmal nocturnal dyspnea or shortness of breath Dermatological: negative for rash Respiratory: negative for cough or wheezing Urologic: negative for hematuria Abdominal: negative for nausea, vomiting, diarrhea, bright red blood per rectum, melena, or hematemesis Neurologic: negative for visual changes, syncope, or dizziness All other systems reviewed and are  otherwise negative except as noted above.    Blood pressure 132/74, pulse 82, height 5\' 1"  (1.549 m), weight 134 lb (60.8 kg).  General appearance: alert and no distress Neck: no adenopathy, no carotid bruit, no JVD, supple, symmetrical, trachea midline and thyroid not enlarged, symmetric, no tenderness/mass/nodules Lungs: clear to auscultation bilaterally Heart: regular rate and rhythm, S1, S2 normal, no murmur, click, rub or gallop Extremities: extremities normal, atraumatic, no cyanosis or  edema  EKG sinus rhythm at 82 ST or T-wave changes. I personally reviewed this EKG.  ASSESSMENT AND PLAN:   Hypertension History of hypertension blood pressure measured 132/74. She is on losartan, amlodipine and verapamil for unclear reasons of these seem to be effective. Continue current meds at current dosing  Other hyperlipidemia History of hyperlipidemia not on statin therapy with recent lipid profile performed 05/26/16 revealed an LDL of 111.  Tobacco use disorder History of 35-50 pack years of tobacco abuse currently smoking one half pack per day down from one pack per day with the intent of stopping smoking  History of MI (myocardial infarction) History of CAD status post myocardial infarction in [place] in the year 2000. She had a femoral artery catheterization but no intervention was performed at that time. She had a routine GXT performed at the request of Dr. Percival Spanish 07/15/16 which was normal. She does have COPD and is short of breath but denies chest pain or back pain currently. She is scheduled for a vocal cord polyp procedure in the next several weeks which she will be cleared for from a cardiac cardiac point of view.      Lorretta Harp MD FACP,FACC,FAHA, [facility] 10/28/2016 8:33 AM

## 2016-10-28 NOTE — Assessment & Plan Note (Signed)
History of 35-50 pack years of tobacco abuse currently smoking one half pack per day down from one pack per day with the intent of stopping smoking

## 2016-10-28 NOTE — Assessment & Plan Note (Signed)
History of hyperlipidemia not on statin therapy with recent lipid profile performed 05/26/16 revealed an LDL of 111.

## 2016-11-04 ENCOUNTER — Other Ambulatory Visit: Payer: Self-pay | Admitting: Adult Health

## 2016-11-04 NOTE — Pre-Procedure Instructions (Signed)
Heidi Foster  11/04/2016      [facility] [place] [address] [place] [place] [address] Phone: [phone] Fax: [phone]    Your procedure is scheduled on July 19  Report to [facility] at 0600 A.M.  Call this number if you have problems the morning of surgery:  [phone]   Remember:  Do not eat food or drink liquids after midnight.   Take these medicines the morning of surgery with A SIP OF WATER albuterol (PROVENTIL HFA;VENTOLIN HFA) bring with you the day of surgery, amLODipine (NORVASC),  ondansetron (ZOFRAN ODT)  7 days prior to surgery STOP taking any Aleve, Naproxen, Ibuprofen, Motrin, Advil, Goody's, BC's, all herbal medications, fish oil, and all vitamins  Follow your doctors instructions regarding your Aspirin.  If no instructions were given by the doctor you will need to call the office to get instructions.  Your pre admission RN will also call for those instructions    Do not wear jewelry, make-up or nail polish.  Do not wear lotions, powders, or perfumes, or deoderant.  Do not shave 48 hours prior to surgery.    Do not bring valuables to the hospital.  [facility] is not responsible for any belongings or valuables.  Contacts, dentures or bridgework may not be worn into surgery.  Leave your suitcase in the car.  After surgery it may be brought to your room.  For patients admitted to the hospital, discharge time will be determined by your treatment team.  Patients discharged the day of surgery will not be allowed to drive home.     Special instructions:   [facility]- Preparing For Surgery  Before surgery, you can play an important role. Because skin is not sterile, your skin needs to be as free of germs as possible. You can reduce the number of germs on your skin by washing with CHG (chlorahexidine gluconate) Soap before surgery.  CHG is an antiseptic cleaner which kills germs and  bonds with the skin to continue killing germs even after washing.  Please do not use if you have an allergy to CHG or antibacterial soaps. If your skin becomes reddened/irritated stop using the CHG.  Do not shave (including legs and underarms) for at least 48 hours prior to first CHG shower. It is OK to shave your face.  Please follow these instructions carefully.   1. Shower the NIGHT BEFORE SURGERY and the MORNING OF SURGERY with CHG.   2. If you chose to wash your hair, wash your hair first as usual with your normal shampoo.  3. After you shampoo, rinse your hair and body thoroughly to remove the shampoo.  4. Use CHG as you would any other liquid soap. You can apply CHG directly to the skin and wash gently with a scrungie or a clean washcloth.   5. Apply the CHG Soap to your body ONLY FROM THE NECK DOWN.  Do not use on open wounds or open sores. Avoid contact with your eyes, ears, mouth and genitals (private parts). Wash genitals (private parts) with your normal soap.  6. Wash thoroughly, paying special attention to the area where your surgery will be performed.  7. Thoroughly rinse your body with warm water from the neck down.  8. DO NOT shower/wash with your normal soap after using and rinsing off the CHG Soap.  9. Pat yourself dry with a CLEAN TOWEL.   10.  [place]   11. Place CLEAN SHEETS on your bed the night of your first shower and DO NOT SLEEP WITH PETS.    Day of Surgery: Do not apply any deodorants/lotions. Please wear clean clothes to the hospital/surgery center.      Please read over the following fact sheets that you were given.

## 2016-11-05 ENCOUNTER — Encounter (HOSPITAL_COMMUNITY): Payer: Self-pay

## 2016-11-05 ENCOUNTER — Encounter (HOSPITAL_COMMUNITY)
Admission: RE | Admit: 2016-11-05 | Discharge: 2016-11-05 | Disposition: A | Discharge status: Home / Self Care | Payer: BLUE CROSS/BLUE SHIELD | Source: Ambulatory Visit | Attending: Otolaryngology | Admitting: Otolaryngology

## 2016-11-05 DIAGNOSIS — Z01812 Encounter for preprocedural laboratory examination: Secondary | ICD-10-CM | POA: Diagnosis present

## 2016-11-05 DIAGNOSIS — J381 Polyp of vocal cord and larynx: Secondary | ICD-10-CM | POA: Insufficient documentation

## 2016-11-05 HISTORY — DX: Polyp of vocal cord and larynx: J38.1

## 2016-11-05 HISTORY — DX: Unspecified osteoarthritis, unspecified site: M19.90

## 2016-11-05 HISTORY — DX: Angina pectoris, unspecified: I20.9

## 2016-11-05 LAB — CBC
HCT: 45.4 % (ref 36.0–46.0)
HEMOGLOBIN: 14.8 g/dL (ref 12.0–15.0)
MCH: 30.2 pg (ref 26.0–34.0)
MCHC: 32.6 g/dL (ref 30.0–36.0)
MCV: 92.7 fL (ref 78.0–100.0)
Platelets: 229 10*3/uL (ref 150–400)
RBC: 4.9 MIL/uL (ref 3.87–5.11)
RDW: 13.9 % (ref 11.5–15.5)
WBC: 7.7 10*3/uL (ref 4.0–10.5)

## 2016-11-05 LAB — BASIC METABOLIC PANEL
ANION GAP: 7 (ref 5–15)
BUN: 15 mg/dL (ref 6–20)
CALCIUM: 9.4 mg/dL (ref 8.9–10.3)
CO2: 25 mmol/L (ref 22–32)
Chloride: 107 mmol/L (ref 101–111)
Creatinine, Ser: 0.57 mg/dL (ref 0.44–1.00)
GFR calc non Af Amer: >60 mL/min (ref >60)
Glucose, Bld: 90 mg/dL (ref 65–99)
Potassium: 3.7 mmol/L (ref 3.5–5.1)
SODIUM: 139 mmol/L (ref 135–145)

## 2016-11-05 NOTE — Progress Notes (Signed)
PCP - [place]  Cardiologist - Quay Burow  Pulmonary- Blanchard Mane  Chest x-ray - 09/25/16 (E)  EKG - 10/28/16 (E)  Stress Test - 07/17/16 (E)  ECHO - Denies  Cardiac Cath - 1999  Sleep Study - Denies CPAP - None   Pt denies having chest pain, sob, or fever at this time. All instructions explained to the pt, with a verbal understanding of the material. Pt agrees to go over the instructions while at home for a better understanding. The opportunity to ask questions was provided.

## 2016-11-12 NOTE — Anesthesia Preprocedure Evaluation (Addendum)
Anesthesia Evaluation  Patient identified by MRN, date of birth, ID band Patient awake    Reviewed: Allergy & Precautions, NPO status , Patient's Chart, lab work & pertinent test results  History of Anesthesia Complications Negative for: history of anesthetic complications  Airway Mallampati: I  TM Distance: >3 FB Neck ROM: Full    Dental  (+) Dental Advisory Given, Missing   Pulmonary COPD,  COPD inhaler, Current Smoker,    breath sounds clear to auscultation       Cardiovascular hypertension, Pt. on medications (-) angina Rhythm:Regular Rate:Normal  3/18 Exercise Stress test: no ischemia   Neuro/Psych TIA   GI/Hepatic Neg liver ROS, GERD  Medicated and Controlled,  Endo/Other  negative endocrine ROS  Renal/GU negative Renal ROS     Musculoskeletal  (+) Arthritis ,   Abdominal   Peds  Hematology negative hematology ROS (+)   Anesthesia Other Findings   Reproductive/Obstetrics                            Anesthesia Physical Anesthesia Plan  ASA: III  Anesthesia Plan: General   Post-op Pain Management:    Induction: Intravenous  PONV Risk Score and Plan: 2 and Ondansetron and Dexamethasone  Airway Management Planned: Oral ETT  Additional Equipment:   Intra-op Plan:   Post-operative Plan: Extubation in OR  Informed Consent: I have reviewed the patients History and Physical, chart, labs and discussed the procedure including the risks, benefits and alternatives for the proposed anesthesia with the patient or authorized representative who has indicated his/her understanding and acceptance.   Dental advisory given  Plan Discussed with: CRNA and Surgeon  Anesthesia Plan Comments: (Plan routine monitors, GETA)        Anesthesia Quick Evaluation

## 2016-11-13 ENCOUNTER — Encounter (HOSPITAL_COMMUNITY)
Admission: RE | Disposition: A | Discharge status: Home / Self Care | Payer: Self-pay | Source: Ambulatory Visit | Attending: Otolaryngology

## 2016-11-13 ENCOUNTER — Ambulatory Visit (HOSPITAL_COMMUNITY)
Admission: RE | Admit: 2016-11-13 | Discharge: 2016-11-13 | Disposition: A | Discharge status: Home / Self Care | Payer: BLUE CROSS/BLUE SHIELD | Source: Ambulatory Visit | Attending: Otolaryngology | Admitting: Otolaryngology

## 2016-11-13 ENCOUNTER — Ambulatory Visit (HOSPITAL_COMMUNITY): Payer: BLUE CROSS/BLUE SHIELD | Admitting: Certified Registered Nurse Anesthetist

## 2016-11-13 ENCOUNTER — Encounter (HOSPITAL_COMMUNITY): Payer: Self-pay

## 2016-11-13 DIAGNOSIS — J381 Polyp of vocal cord and larynx: Secondary | ICD-10-CM | POA: Insufficient documentation

## 2016-11-13 DIAGNOSIS — I252 Old myocardial infarction: Secondary | ICD-10-CM | POA: Diagnosis not present

## 2016-11-13 DIAGNOSIS — I10 Essential (primary) hypertension: Secondary | ICD-10-CM | POA: Diagnosis not present

## 2016-11-13 DIAGNOSIS — K219 Gastro-esophageal reflux disease without esophagitis: Secondary | ICD-10-CM | POA: Diagnosis not present

## 2016-11-13 DIAGNOSIS — Z888 Allergy status to other drugs, medicaments and biological substances status: Secondary | ICD-10-CM | POA: Diagnosis not present

## 2016-11-13 DIAGNOSIS — M1990 Unspecified osteoarthritis, unspecified site: Secondary | ICD-10-CM | POA: Diagnosis not present

## 2016-11-13 DIAGNOSIS — Z85828 Personal history of other malignant neoplasm of skin: Secondary | ICD-10-CM | POA: Insufficient documentation

## 2016-11-13 DIAGNOSIS — Z79899 Other long term (current) drug therapy: Secondary | ICD-10-CM | POA: Insufficient documentation

## 2016-11-13 DIAGNOSIS — Z7982 Long term (current) use of aspirin: Secondary | ICD-10-CM | POA: Insufficient documentation

## 2016-11-13 DIAGNOSIS — L821 Other seborrheic keratosis: Secondary | ICD-10-CM | POA: Diagnosis not present

## 2016-11-13 DIAGNOSIS — F17210 Nicotine dependence, cigarettes, uncomplicated: Secondary | ICD-10-CM | POA: Diagnosis not present

## 2016-11-13 DIAGNOSIS — Z885 Allergy status to narcotic agent status: Secondary | ICD-10-CM | POA: Diagnosis not present

## 2016-11-13 HISTORY — PX: MICROLARYNGOSCOPY WITH LASER: SHX5972

## 2016-11-13 SURGERY — MICROLARYNGOSCOPY, WITH PROCEDURE USING LASER
Anesthesia: General | Site: Mouth | Laterality: Bilateral

## 2016-11-13 MED ORDER — EPINEPHRINE HCL (NASAL) 0.1 % NA SOLN
NASAL | Status: AC
  Filled 2016-11-13: qty 30

## 2016-11-13 MED ORDER — MEPERIDINE HCL 25 MG/ML IJ SOLN: 6.2500 mg | INTRAMUSCULAR | Status: DC | PRN

## 2016-11-13 MED ORDER — DEXAMETHASONE SODIUM PHOSPHATE 10 MG/ML IJ SOLN
INTRAMUSCULAR | Status: AC
  Filled 2016-11-13: qty 1

## 2016-11-13 MED ORDER — DEXAMETHASONE SODIUM PHOSPHATE 10 MG/ML IJ SOLN
INTRAMUSCULAR | Status: DC | PRN
  Administered 2016-11-13: 10 mg via INTRAVENOUS

## 2016-11-13 MED ORDER — FENTANYL CITRATE (PF) 100 MCG/2ML IJ SOLN: 25.0000 ug | INTRAMUSCULAR | Status: DC | PRN

## 2016-11-13 MED ORDER — PROMETHAZINE HCL 25 MG/ML IJ SOLN: 6.2500 mg | INTRAMUSCULAR | Status: DC | PRN

## 2016-11-13 MED ORDER — FENTANYL CITRATE (PF) 100 MCG/2ML IJ SOLN
INTRAMUSCULAR | Status: DC | PRN
  Administered 2016-11-13 (×2): 50 ug via INTRAVENOUS

## 2016-11-13 MED ORDER — ROCURONIUM BROMIDE 50 MG/5ML IV SOLN
INTRAVENOUS | Status: AC
  Filled 2016-11-13: qty 1

## 2016-11-13 MED ORDER — SUGAMMADEX SODIUM 200 MG/2ML IV SOLN
INTRAVENOUS | Status: AC
  Filled 2016-11-13: qty 2

## 2016-11-13 MED ORDER — LACTATED RINGERS IV SOLN
INTRAVENOUS | Status: DC | PRN
  Administered 2016-11-13 (×2): via INTRAVENOUS

## 2016-11-13 MED ORDER — MIDAZOLAM HCL 2 MG/2ML IJ SOLN: 0.5000 mg | Freq: Once | INTRAMUSCULAR | Status: DC | PRN

## 2016-11-13 MED ORDER — MIDAZOLAM HCL 2 MG/2ML IJ SOLN
INTRAMUSCULAR | Status: AC
  Filled 2016-11-13: qty 2

## 2016-11-13 MED ORDER — LIDOCAINE HCL (CARDIAC) 20 MG/ML IV SOLN
INTRAVENOUS | Status: DC | PRN
  Administered 2016-11-13: 50 mg via INTRAVENOUS

## 2016-11-13 MED ORDER — PROPOFOL 10 MG/ML IV BOLUS
INTRAVENOUS | Status: AC
  Filled 2016-11-13: qty 40

## 2016-11-13 MED ORDER — SUGAMMADEX SODIUM 200 MG/2ML IV SOLN
INTRAVENOUS | Status: DC | PRN
  Administered 2016-11-13: 200 mg via INTRAVENOUS

## 2016-11-13 MED ORDER — EPINEPHRINE HCL (NASAL) 0.1 % NA SOLN
NASAL | Status: DC | PRN
  Administered 2016-11-13: 30 mL via NASAL

## 2016-11-13 MED ORDER — ONDANSETRON HCL 4 MG/2ML IJ SOLN
INTRAMUSCULAR | Status: DC | PRN
  Administered 2016-11-13: 4 mg via INTRAVENOUS

## 2016-11-13 MED ORDER — FENTANYL CITRATE (PF) 250 MCG/5ML IJ SOLN
INTRAMUSCULAR | Status: AC
  Filled 2016-11-13: qty 5

## 2016-11-13 MED ORDER — MIDAZOLAM HCL 5 MG/5ML IJ SOLN
INTRAMUSCULAR | Status: DC | PRN
  Administered 2016-11-13: 2 mg via INTRAVENOUS

## 2016-11-13 MED ORDER — LIDOCAINE HCL (CARDIAC) 20 MG/ML IV SOLN
INTRAVENOUS | Status: AC
  Filled 2016-11-13: qty 5

## 2016-11-13 MED ORDER — ONDANSETRON HCL 4 MG/2ML IJ SOLN
INTRAMUSCULAR | Status: AC
  Filled 2016-11-13: qty 2

## 2016-11-13 MED ORDER — ROCURONIUM BROMIDE 100 MG/10ML IV SOLN
INTRAVENOUS | Status: DC | PRN
  Administered 2016-11-13: 40 mg via INTRAVENOUS
  Administered 2016-11-13: 10 mg via INTRAVENOUS

## 2016-11-13 SURGICAL SUPPLY — 24 items
BLADE SURG 10 STRL SS (BLADE) IMPLANT
BLADE SURG 15 STRL LF DISP TIS (BLADE) IMPLANT
BLADE SURG 15 STRL SS (BLADE)
CANISTER SUCT 3000ML PPV (MISCELLANEOUS) ×3 IMPLANT
CONT SPEC 4OZ CLIKSEAL STRL BL (MISCELLANEOUS) IMPLANT
COVER BACK TABLE 60X90IN (DRAPES) ×3 IMPLANT
COVER MAYO STAND STRL (DRAPES) ×3 IMPLANT
CRADLE DONUT ADULT HEAD (MISCELLANEOUS) ×3 IMPLANT
DRAPE HALF SHEET 40X57 (DRAPES) ×3 IMPLANT
GAUZE SPONGE 4X4 12PLY STRL (GAUZE/BANDAGES/DRESSINGS) IMPLANT
GLOVE SS BIOGEL STRL SZ 7.5 (GLOVE) ×1 IMPLANT
GLOVE SUPERSENSE BIOGEL SZ 7.5 (GLOVE) ×2
GUARD TEETH (MISCELLANEOUS) ×3 IMPLANT
KIT BASIN OR (CUSTOM PROCEDURE TRAY) ×3 IMPLANT
KIT ROOM TURNOVER OR (KITS) ×3 IMPLANT
NEEDLE HYPO 25GX1X1/2 BEV (NEEDLE) IMPLANT
NS IRRIG 1000ML POUR BTL (IV SOLUTION) ×3 IMPLANT
PAD ARMBOARD 7.5X6 YLW CONV (MISCELLANEOUS) ×3 IMPLANT
PATTIES SURGICAL .5 X1 (DISPOSABLE) ×3 IMPLANT
SPECIMEN JAR SMALL (MISCELLANEOUS) ×3 IMPLANT
TOWEL OR 17X24 6PK STRL BLUE (TOWEL DISPOSABLE) ×3 IMPLANT
TUBE CONNECTING 12'X1/4 (SUCTIONS) ×1
TUBE CONNECTING 12X1/4 (SUCTIONS) ×2 IMPLANT
WATER STERILE IRR 1000ML POUR (IV SOLUTION) ×3 IMPLANT

## 2016-11-13 NOTE — Op Note (Signed)
Preop/postop diagnosis: Laryngeal polyps Procedure: Microlaryngoscopy with removal of polyps Anesthesia: Gen. Estimated blood loss: Less than 5 mL Indications: 61 year old with a significant amount of laryngeal polyps and Reinke space edema secondary to smoking which she continues to do. She is informed risk and benefits of the procedure and options were discussed all questions are answered and consent was obtained. Operation: Patient was taken to the operating room placed in the supine position after general endotracheal tube anesthesia with a 5.5 endotracheal tube the Dedo scope was inserted and the larynx was examined. The epiglottis looked normal as well as piriform sinuses which were examined prior to placing the scope at the glottis. The glottis had polyps that appeared to completely occlude the glottis. An adrenaline soaked pledget was placed over them and then the grasping forceps and straight scissors were used to dissect the polyps. Initially the polyps could not be determine which side they were coming from as they were extensive. Once that larger polyp was remove the right side was stripped with a vocal cord with extensive edema in the vocal cord which was suctioned out. The polyps were stripped off the cord. The muscle was left intact. Left side was same and there was also polyps that were slightly below the vocal cord that were removed. There is a small amount of debris right at the anterior commissure but this was left intact since both vocal cords had been stripped. This dramatically open the glottis. Both vocal cord folds looks straight. Adrenaline soaked pledget was placed and that again good hemostasis. There was no subglottic lesions. The scope was removed and the patient was awakened brought to recovery in stable condition counts correct

## 2016-11-13 NOTE — Anesthesia Postprocedure Evaluation (Signed)
Anesthesia Post Note  Patient: Heidi Foster  Procedure(s) Performed: Procedure(s) (LRB): MICROLARYNGOSCOPY (Bilateral)     Patient location during evaluation: PACU Anesthesia Type: General Level of consciousness: awake and alert, patient cooperative and oriented Pain management: pain level controlled Vital Signs Assessment: post-procedure vital signs reviewed and stable Respiratory status: spontaneous breathing, nonlabored ventilation and respiratory function stable Cardiovascular status: blood pressure returned to baseline and stable Postop Assessment: no signs of nausea or vomiting Anesthetic complications: no    Last Vitals:  Vitals:   11/13/16 1000 11/13/16 1015  BP: 120/71 (!) 143/87  Pulse: 71 74  Resp: 15 15  Temp: 36.5 C     Last Pain:  Vitals:   11/13/16 1015  TempSrc:   PainSc: 0-No pain                 Ivelise Castillo,E. Marda Breidenbach

## 2016-11-13 NOTE — Anesthesia Procedure Notes (Signed)
Procedure Name: Intubation Date/Time: 11/13/2016 8:11 AM Performed by: Valda Favia Pre-anesthesia Checklist: Patient identified, Emergency Drugs available, Suction available, Patient being monitored and Timeout performed Patient Re-evaluated:Patient Re-evaluated prior to induction Oxygen Delivery Method: Circle system utilized Preoxygenation: Pre-oxygenation with 100% oxygen Induction Type: IV induction Ventilation: Mask ventilation without difficulty and Oral airway inserted - appropriate to patient size Laryngoscope Size: Mac and 4 Grade View: Grade II Tube type: Oral Tube size: 5.5 mm Number of attempts: 1 Airway Equipment and Method: Stylet Placement Confirmation: ETT inserted through vocal cords under direct vision,  positive ETCO2 and breath sounds checked- equal and bilateral Secured at: 21 cm Tube secured with: marked with tape for ENT surgeon. Dental Injury: Teeth and Oropharynx as per pre-operative assessment

## 2016-11-13 NOTE — Transfer of Care (Signed)
Immediate Anesthesia Transfer of Care Note  Patient: Heidi Foster  Procedure(s) Performed: Procedure(s): MICROLARYNGOSCOPY (Bilateral)  Patient Location: PACU  Anesthesia Type:General  Level of Consciousness: awake and alert   Airway & Oxygen Therapy: Patient Spontanous Breathing and Patient connected to nasal cannula oxygen  Post-op Assessment: Report given to RN and Post -op Vital signs reviewed and stable  Post vital signs: Reviewed and stable  Last Vitals:  Vitals:   11/13/16 0703 11/13/16 0905  BP: 137/86   Pulse: 67   Resp: 18   Temp: 36.8 C (P) 36.6 C    Last Pain:  Vitals:   11/13/16 0703  TempSrc: Oral      Patients Stated Pain Goal: 0 (66/06/00 4599)  Complications: No apparent anesthesia complications

## 2016-11-13 NOTE — H&P (Signed)
Heidi Foster is an 61 y.o. female.   Chief Complaint: hoarseness HPI: hx of SOB and smoking hx with polyps on both vocal cords  Past Medical History:  Diagnosis Date  . Allergy   . Anginal pain ([place])   . Arthritis   . Cancer ([place])    skin  . Closed fracture of right patella   . GERD (gastroesophageal reflux disease)   . Heart attack ([place]) 1999  . Hypertension   . Seborrheic keratoses   . Vocal cord polyp     Past Surgical History:  Procedure Laterality Date  . BREAST SURGERY     Left Breast nodule markers  . CARDIAC CATHETERIZATION     1999  . caridac cath    . SKIN BIOPSY     nose  . TUBAL LIGATION      Family History  Problem Relation Age of Onset  . Diabetes Mother   . Hypertension Mother   . Stroke Mother   . Alcohol abuse Father   . Multiple sclerosis Sister   . Cancer Brother        lung  . Stroke Maternal Grandmother   . Heart attack Maternal Grandmother    Social History:  reports that she has been smoking Cigarettes.  She started smoking about 40 years ago. She has a 20.00 pack-year smoking history. She has never used smokeless tobacco. She reports that she does not drink alcohol or use drugs.  Allergies:  Allergies  Allergen Reactions  . Codeine Anaphylaxis  . Bupropion Other (See Comments)    Anger and mental issues    Medications Prior to Admission  Medication Sig Dispense Refill  . albuterol (PROVENTIL HFA;VENTOLIN HFA) 108 (90 Base) MCG/ACT inhaler Inhale 2 puffs into the lungs every 4 (four) hours as needed for wheezing or shortness of breath. 1 Inhaler 3  . amLODipine (NORVASC) 2.5 MG tablet Take 1 tablet (2.5 mg total) by mouth daily. Stop taking Amlodipine 10mg . 90 tablet 2  . aspirin EC 81 MG tablet Take 81 mg by mouth daily.    Marland Kitchen losartan (COZAAR) 100 MG tablet TAKE ONE TABLET BY MOUTH ONCE DAILY 90 tablet 1  . Multiple Vitamin (MULTIVITAMIN) tablet Take 1 tablet by mouth daily.    . verapamil (VERELAN PM) 180 MG 24 hr capsule TAKE ONE  TABLET BY MOUTH ONCE DAILY 90 capsule 1  . ondansetron (ZOFRAN ODT) 4 MG disintegrating tablet Take 1 tablet (4 mg total) by mouth every 8 (eight) hours as needed for nausea or vomiting. 15 tablet 0  . Spacer/Aero-Holding Chambers (AEROCHAMBER MV) inhaler Use as instructed 1 each 0    No results found for this or any previous visit (from the past 48 hour(s)). No results found.  Review of Systems  Constitutional: Negative.   HENT: Negative.   Eyes: Negative.   Respiratory: Negative.   Cardiovascular: Negative.   Skin: Negative.     Blood pressure 137/86, pulse 67, temperature 98.2 F (36.8 C), temperature source Oral, resp. rate 18, height 5\' 2"  (1.575 m), weight 59.9 kg (132 lb), SpO2 95 %. Physical Exam  Constitutional: She appears well-developed and well-nourished.  HENT:  Head: Normocephalic and atraumatic.  Nose: Nose normal.  Eyes: Pupils are equal, round, and reactive to light. Conjunctivae are normal.  Neck: Normal range of motion. Neck supple.  Cardiovascular: Normal rate.   Respiratory: Effort normal.  GI: Soft.  Musculoskeletal: Normal range of motion.     Assessment/Plan Vocal cord polyps- discussed microlaryngoscopy and  ready to proceed  Melissa Montane, MD 11/13/2016, 7:06 AM

## 2016-11-14 ENCOUNTER — Encounter (HOSPITAL_COMMUNITY): Payer: Self-pay | Admitting: Otolaryngology

## 2016-12-08 ENCOUNTER — Telehealth: Payer: Self-pay | Admitting: Adult Health

## 2016-12-08 NOTE — Telephone Encounter (Signed)
Pt states has been having dull aching left side breast pain,breast tenderness request nurse/provider call her back- pt states call breast center to be seen but they advised she needed to go thru PCP for referral to them. --glh

## 2016-12-08 NOTE — Telephone Encounter (Signed)
Advised pt needs OV for evaluation.  Pt expressed understanding and is agreeable.  Pt transferred to front desk to schedule appt.  Charyl Bigger, CMA

## 2016-12-12 NOTE — Telephone Encounter (Signed)
OK with me.

## 2016-12-15 ENCOUNTER — Encounter: Payer: Self-pay | Admitting: Adult Health

## 2016-12-15 ENCOUNTER — Ambulatory Visit (INDEPENDENT_AMBULATORY_CARE_PROVIDER_SITE_OTHER): Payer: BLUE CROSS/BLUE SHIELD | Admitting: Adult Health

## 2016-12-15 VITALS — BP 127/71 | HR 71 | Ht 61.0 in | Wt 136.6 lb

## 2016-12-15 DIAGNOSIS — R531 Weakness: Secondary | ICD-10-CM | POA: Diagnosis not present

## 2016-12-15 DIAGNOSIS — M79604 Pain in right leg: Secondary | ICD-10-CM | POA: Diagnosis not present

## 2016-12-15 DIAGNOSIS — M79605 Pain in left leg: Secondary | ICD-10-CM | POA: Diagnosis not present

## 2016-12-15 DIAGNOSIS — N644 Mastodynia: Secondary | ICD-10-CM | POA: Diagnosis not present

## 2016-12-15 MED ORDER — MELOXICAM 7.5 MG PO TABS: 7.5000 mg | ORAL_TABLET | Freq: Two times a day (BID) | ORAL | 0 refills | Status: DC | PRN

## 2016-12-15 NOTE — Assessment & Plan Note (Addendum)
TTP lateral and superior L breast. No open tissue/puckering noted.  Referral placed for OB/GYN placed.

## 2016-12-15 NOTE — Patient Instructions (Signed)
Breast Tenderness Breast tenderness is a common problem for women of all ages. Breast tenderness may cause mild discomfort to severe pain. The pain usually comes and goes in association with your menstrual cycle, but it can be constant. Breast tenderness has many possible causes, including hormone changes and some medicines. Your health care provider may order tests, such as a mammogram or an ultrasound, to check for any unusual findings. Having breast tenderness usually does not mean that you have breast cancer. Follow these instructions at home: Sometimes, reassurance that you do not have breast cancer is all that is needed. In general, follow these home care instructions: Managing pain and discomfort  If directed, apply ice to the area: ? Put ice in a plastic bag. ? Place a towel between your skin and the bag. ? Leave the ice on for 20 minutes, 2-3 times a day.  Make sure you are wearing a supportive bra, especially during exercise. You may also want to wear a supportive bra while sleeping if your breasts are very tender. Medicines  Take over-the-counter and prescription medicines only as told by your health care provider. If the cause of your pain is infection, you may be prescribed an antibiotic medicine.  If you were prescribed an antibiotic, take it as told by your health care provider. Do not stop taking the antibiotic even if you start to feel better. General instructions  Your health care provider may recommend that you reduce the amount of fat in your diet. You can do this by: ? Limiting fried foods. ? Cooking foods using methods, such as baking, boiling, grilling, and broiling.  Decrease the amount of caffeine in your diet. You can do this by drinking more water and choosing caffeine-free options.  Keep a log of the days and times when your breasts are most tender.  Ask your health care provider how to do breast exams at home. This will help you notice if you have an unusual  growth or lump. Contact a health care provider if:  Any part of your breast is hard, red, and hot to the touch. This may be a sign of infection.  You are not breastfeeding and you have fluid, especially blood or pus, coming out of your nipples.  You have a fever.  You have a new or painful lump in your breast that remains after your menstrual period ends.  Your pain does not improve or it gets worse.  Your pain is interfering with your daily activities. This information is not intended to replace advice given to you by your health care provider. Make sure you discuss any questions you have with your health care provider. Document Released: 03/27/2008 Document Revised: 01/11/2016 Document Reviewed: 01/11/2016 Elsevier Interactive Patient Education  2018 [employer].    Fatigue Fatigue is feeling tired all of the time, a lack of energy, or a lack of motivation. Occasional or mild fatigue is often a normal response to activity or life in general. However, long-lasting (chronic) or extreme fatigue may indicate an underlying medical condition. Follow these instructions at home: Watch your fatigue for any changes. The following actions may help to lessen any discomfort you are feeling:  Talk to your health care provider about how much sleep you need each night. Try to get the required amount every night.  Take medicines only as directed by your health care provider.  Eat a healthy and nutritious diet. Ask your health care provider if you need help changing your diet.  Drink  enough fluid to keep your urine clear or pale yellow.  Practice ways of relaxing, such as yoga, meditation, massage therapy, or acupuncture.  Exercise regularly.  Change situations that cause you stress. Try to keep your work and personal routine reasonable.  Do not abuse illegal drugs.  Limit alcohol intake to no more than 1 drink per day for nonpregnant women and 2 drinks per day for men. One drink equals 12  ounces of beer, 5 ounces of wine, or 1 ounces of hard liquor.  Take a multivitamin, if directed by your health care provider.  Contact a health care provider if:  Your fatigue does not get better.  You have a fever.  You have unintentional weight loss or gain.  You have headaches.  You have difficulty: ? Falling asleep. ? Sleeping throughout the night.  You feel angry, guilty, anxious, or sad.  You are unable to have a bowel movement (constipation).  You skin is dry.  Your legs or another part of your body is swollen. Get help right away if:  You feel confused.  Your vision is blurry.  You feel faint or pass out.  You have a severe headache.  You have severe abdominal, pelvic, or back pain.  You have chest pain, shortness of breath, or an irregular or fast heartbeat.  You are unable to urinate or you urinate less than normal.  You develop abnormal bleeding, such as bleeding from the rectum, vagina, nose, lungs, or nipples.  You vomit blood.  You have thoughts about harming yourself or committing suicide.  You are worried that you might harm someone else. This information is not intended to replace advice given to you by your health care provider. Make sure you discuss any questions you have with your health care provider. Document Released: 02/09/2007 Document Revised: 09/20/2015 Document Reviewed: 08/16/2013 Elsevier Interactive Patient Education  2018 [employer].   Referral placed for OB/GYN to address left breast pain. Continue to drink water, strive for at least 65 ounces/day. Recommend stretching and walking/stationry bike daily. Meloxicam 7.5mg  every 12 hrs as needed for pain. We will call when lab results are available. Please schedule full physical next month.

## 2016-12-15 NOTE — Assessment & Plan Note (Signed)
CMP, Mg, Phos, TSH labs obtained. Will call when lab results are available.

## 2016-12-15 NOTE — Progress Notes (Signed)
Subjective:    Patient ID: Heidi Foster, female    DOB: Aug 07, 1955, 61 y.o.   MRN: [id number]  HPI :  Heidi Foster presents with two complaints:  1) L breast tenderness that developed 1.5 weeks ago.  Pain is intermittent and worsened when she "presses on or lays on my left breast".  Pain is rated 5/10 and described as sharp pressure.  08/14/16 :Bil mammogram screening completed. Results: indeterminate L breast calcifications that was addressed with stereotactic guided L breast biopsy.  Recommended to repeat mammogram in 6 months. She denies change in skin of breast or d/c from nipple. 2) Generalized pain of "butt bones", R groin, bil thighs, and bil calves that began >1.78months ago.  Pain is constant and worsened with standing/walking, rated 7/10 and she is unable to offer a description.  She denies acute trauma/accident prior to onset of sx's.  She also has increased fatigue/malaise.  She denies hx of fibromyalgia.  She has been using OTC "Salonpas" and OTC Aleve with only minimal pain relief.  She is able to ambulate without assistive devices and denies recent falls.  Patient Care Team    Relationship Specialty Notifications Start End  Esaw Grandchild, NP PCP - General Family Medicine  05/26/16     Patient Active Problem List   Diagnosis Date Noted  . Breast pain, left 12/15/2016  . Generalized weakness 12/15/2016  . Leg pain, bilateral 12/15/2016  . 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 Centrilobular emphysema ([place]) 09/09/2016  . GERD (gastroesophageal reflux disease) 06/30/2016  . Dysphagia 06/30/2016  . Health care maintenance 06/25/2016  . Other fatigue 05/26/2016  . Hypertension 05/26/2016  . Other hyperlipidemia 05/26/2016  . Family history of diabetes mellitus in mother 05/26/2016  . Acute pain of right shoulder 05/26/2016  . Tobacco use disorder 05/26/2016  . Dyspnea on exertion 05/26/2016  . Chronic pain of right knee 05/26/2016  . History of MI (myocardial infarction) 05/26/2016  .  History of TIA (transient ischemic attack) 05/26/2016  . Numbness and tingling of left lower extremity 05/26/2016  . Numbness and tingling in left hand 05/26/2016  . 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 Allergy   . Anginal pain ([place])   . Arthritis   . Cancer ([place])    skin  . Closed fracture of right patella   . GERD (gastroesophageal reflux disease)   . Heart attack ([place]) 1999  . Hypertension   . Seborrheic keratoses   . Vocal cord polyp      Past Surgical History:  Procedure Laterality Date  . BREAST SURGERY     Left Breast nodule markers  . CARDIAC CATHETERIZATION     1999  . caridac cath    . MICROLARYNGOSCOPY WITH LASER Bilateral 11/13/2016   Procedure: MICROLARYNGOSCOPY;  Surgeon: Melissa Montane, MD;  Location: [facility];  Service: ENT;  Laterality: Bilateral;  . SKIN BIOPSY     nose  . TUBAL LIGATION       Family History  Problem Relation Age of Onset  . Diabetes Mother   . Hypertension Mother   . Stroke Mother   . Alcohol abuse Father   . Multiple sclerosis Sister   . Cancer Brother        lung  . Stroke Maternal Grandmother   . Heart attack Maternal Grandmother      History  Drug Use No     History  Alcohol Use No  History  Smoking Status  . Current Every Day Smoker  . Packs/day: 0.50  . Years: 40.00  . Types: Cigarettes  . Start date: 09/27/1976  Smokeless Tobacco  . Never Used    Comment: Peak rate of 1ppd     Outpatient Encounter Prescriptions as of 12/15/2016  Medication Sig  . albuterol (PROVENTIL HFA;VENTOLIN HFA) 108 (90 Base) MCG/ACT inhaler Inhale 2 puffs into the lungs every 4 (four) hours as needed for wheezing or shortness of breath.  Marland Kitchen amLODipine (NORVASC) 2.5 MG tablet Take 1 tablet (2.5 mg total) by mouth daily. Stop taking Amlodipine 10mg .  . ANORO ELLIPTA 62.5-25 MCG/INH AEPB Inhale 1 puff into the lungs daily.  Marland Kitchen aspirin EC 81 MG tablet Take 81 mg by mouth daily.  Marland Kitchen losartan (COZAAR)  100 MG tablet TAKE ONE TABLET BY MOUTH ONCE DAILY  . Multiple Vitamin (MULTIVITAMIN) tablet Take 1 tablet by mouth daily.  . ondansetron (ZOFRAN ODT) 4 MG disintegrating tablet Take 1 tablet (4 mg total) by mouth every 8 (eight) hours as needed for nausea or vomiting.  Marland Kitchen Spacer/Aero-Holding Chambers (AEROCHAMBER MV) inhaler Use as instructed  . verapamil (VERELAN PM) 180 MG 24 hr capsule TAKE ONE TABLET BY MOUTH ONCE DAILY  . meloxicam (MOBIC) 7.5 MG tablet Take 1 tablet (7.5 mg total) by mouth 2 (two) times daily as needed for pain.   No facility-administered encounter medications on file as of 12/15/2016.     Allergies: Codeine and Bupropion  Body mass index is 25.81 kg/m.  Blood pressure 127/71, pulse 71, height 5\' 1"  (1.549 m), weight 136 lb 9.6 oz (62 kg).    Review of Systems  Constitutional: Positive for activity change and fatigue. Negative for appetite change, chills, diaphoresis, fever and unexpected weight change.  Eyes: Negative for visual disturbance.  Respiratory: Positive for cough. Negative for chest tightness, shortness of breath, wheezing and stridor.   Cardiovascular: Negative for chest pain, palpitations and leg swelling.  Gastrointestinal: Negative for abdominal distention, abdominal pain, blood in stool, constipation, diarrhea, nausea and vomiting.  Endocrine: Negative for cold intolerance, heat intolerance, polydipsia, polyphagia and polyuria.  Genitourinary: Negative for difficulty urinating and flank pain.  Musculoskeletal: Positive for arthralgias, back pain, gait problem, joint swelling, myalgias, neck pain and neck stiffness.  Skin: Negative for color change, pallor, rash and wound.  Allergic/Immunologic: Negative for immunocompromised state.  Neurological: Negative for dizziness, tremors, weakness and light-headedness.  Hematological: Does not bruise/bleed easily.  Psychiatric/Behavioral: Positive for sleep disturbance. Negative for decreased  concentration, dysphoric mood, hallucinations, self-injury and suicidal ideas. The patient is not nervous/anxious and is not hyperactive.        Objective:   Physical Exam  Constitutional: She is oriented to person, place, and time. She appears well-developed and well-nourished. No distress.  HENT:  Head: Normocephalic and atraumatic.  Right Ear: External ear normal.  Left Ear: External ear normal.  Eyes: Pupils are equal, round, and reactive to light. Conjunctivae are normal.  Neck: Normal range of motion. Neck supple.  Cardiovascular: Normal rate, regular rhythm and normal heart sounds.   No murmur heard. Pulmonary/Chest: Effort normal and breath sounds normal. No respiratory distress. She has no wheezes. She has no rales. She exhibits no tenderness. Right breast exhibits no inverted nipple, no mass, no nipple discharge, no skin change and no tenderness. Left breast exhibits tenderness. Left breast exhibits no inverted nipple, no mass, no nipple discharge and no skin change.  TTP lateral and superior L breast. No open tissue/puckering  noted.   Musculoskeletal: Normal range of motion. She exhibits tenderness.       Right hip: She exhibits decreased strength and tenderness. She exhibits normal range of motion.       Left hip: She exhibits decreased strength and tenderness. She exhibits normal range of motion.       Lumbar back: She exhibits tenderness and pain. She exhibits normal range of motion and no spasm.  Lymphadenopathy:    She has no cervical adenopathy.  Neurological: She is alert and oriented to person, place, and time. Coordination normal.  Skin: Skin is warm and dry. No rash noted. She is not diaphoretic. No erythema. No pallor.  Psychiatric: She has a normal mood and affect. Her behavior is normal. Judgment and thought content normal.  Nursing note and vitals reviewed.         Assessment & Plan:   1. Breast pain, left   2. Generalized weakness   3. Leg pain,  bilateral     Breast pain, left TTP lateral and superior L breast. No open tissue/puckering noted.  Referral placed for OB/GYN placed.   Generalized weakness CMP, Mg, Phos, TSH labs obtained. Will call when lab results are available.  Leg pain, bilateral Daily stretching and heating pad. Recommend strength training- walking, stationary bike,swimming. Meloxicam 7.5mg  BID PRN pain.     FOLLOW-UP:  Return in about 4 weeks (around 01/12/2017) for CPE.

## 2016-12-15 NOTE — Assessment & Plan Note (Signed)
Daily stretching and heating pad. Recommend strength training- walking, stationary bike,swimming. Meloxicam 7.5mg  BID PRN pain.

## 2016-12-16 LAB — COMPREHENSIVE METABOLIC PANEL
ALBUMIN: 4.1 g/dL (ref 3.6–4.8)
ALK PHOS: 118 IU/L — AB (ref 39–117)
ALT: 10 IU/L (ref 0–32)
AST: 16 IU/L (ref 0–40)
Albumin/Globulin Ratio: 1.9 (ref 1.2–2.2)
BUN / CREAT RATIO: 28 (ref 12–28)
BUN: 17 mg/dL (ref 8–27)
Bilirubin Total: <0.2 mg/dL (ref 0.0–1.2)
CALCIUM: 9.2 mg/dL (ref 8.7–10.3)
CO2: 20 mmol/L (ref 20–29)
CREATININE: 0.6 mg/dL (ref 0.57–1.00)
Chloride: 105 mmol/L (ref 96–106)
GFR calc Af Amer: 115 mL/min/{1.73_m2} (ref >59)
GFR, EST NON AFRICAN AMERICAN: 99 mL/min/{1.73_m2} (ref >59)
GLUCOSE: 79 mg/dL (ref 65–99)
Globulin, Total: 2.2 g/dL (ref 1.5–4.5)
Potassium: 4.2 mmol/L (ref 3.5–5.2)
Sodium: 140 mmol/L (ref 134–144)
Total Protein: 6.3 g/dL (ref 6.0–8.5)

## 2016-12-16 LAB — PHOSPHORUS: PHOSPHORUS: 3.6 mg/dL (ref 2.5–4.5)

## 2016-12-16 LAB — TSH: TSH: 1.01 u[IU]/mL (ref 0.450–4.500)

## 2016-12-16 LAB — MAGNESIUM: Magnesium: 2.2 mg/dL (ref 1.6–2.3)

## 2016-12-27 DIAGNOSIS — M8580 Other specified disorders of bone density and structure, unspecified site: Secondary | ICD-10-CM

## 2016-12-27 HISTORY — DX: Other specified disorders of bone density and structure, unspecified site: M85.80

## 2017-01-02 ENCOUNTER — Ambulatory Visit: Payer: BLUE CROSS/BLUE SHIELD | Admitting: Pulmonary Disease

## 2017-01-08 ENCOUNTER — Other Ambulatory Visit: Payer: Self-pay | Admitting: Adult Health

## 2017-01-08 DIAGNOSIS — N644 Mastodynia: Secondary | ICD-10-CM

## 2017-01-08 DIAGNOSIS — N6012 Diffuse cystic mastopathy of left breast: Secondary | ICD-10-CM

## 2017-01-12 ENCOUNTER — Ambulatory Visit (INDEPENDENT_AMBULATORY_CARE_PROVIDER_SITE_OTHER): Payer: BLUE CROSS/BLUE SHIELD | Admitting: Pulmonary Disease

## 2017-01-12 ENCOUNTER — Encounter: Payer: Self-pay | Admitting: Pulmonary Disease

## 2017-01-12 VITALS — BP 130/74 | HR 83 | Ht 61.0 in | Wt 141.2 lb

## 2017-01-12 DIAGNOSIS — J432 Centrilobular emphysema: Secondary | ICD-10-CM

## 2017-01-12 DIAGNOSIS — R0609 Other forms of dyspnea: Secondary | ICD-10-CM | POA: Diagnosis not present

## 2017-01-12 DIAGNOSIS — K219 Gastro-esophageal reflux disease without esophagitis: Secondary | ICD-10-CM

## 2017-01-12 DIAGNOSIS — F17200 Nicotine dependence, unspecified, uncomplicated: Secondary | ICD-10-CM | POA: Diagnosis not present

## 2017-01-12 LAB — PULMONARY FUNCTION TEST
DL/VA % PRED: 90 %
DL/VA: 4 ml/min/mmHg/L
DLCO UNC % PRED: 80 %
DLCO UNC: 16.3 ml/min/mmHg
DLCO cor % pred: 79 %
DLCO cor: 16.06 ml/min/mmHg
FEF 25-75 PRE: 1.78 L/s
FEF2575-%Pred-Pre: 82 %
FEV1-%PRED-PRE: 93 %
FEV1-Pre: 2.12 L
FEV1FVC-%PRED-PRE: 97 %
FEV6-%Pred-Pre: 97 %
FEV6-PRE: 2.76 L
FEV6FVC-%PRED-PRE: 103 %
FVC-%Pred-Pre: 94 %
FVC-Pre: 2.78 L
PRE FEV1/FVC RATIO: 76 %
Pre FEV6/FVC Ratio: 99 %

## 2017-01-12 MED ORDER — FLUTICASONE-UMECLIDIN-VILANT 100-62.5-25 MCG/INH IN AEPB: 1.0000 | INHALATION_SPRAY | Freq: Every day | RESPIRATORY_TRACT | 0 refills | Status: DC

## 2017-01-12 NOTE — Progress Notes (Signed)
Subjective:    Patient ID: Heidi Foster, female    DOB: May 12, 1955, 61 y.o.   MRN: [id number]  C.C.:  Follow-up for Centrilobular Emphysema, Tobacco Use Disorder, GERD, & Voice Changes/Hoarseness.   HPI Centrilobular emphysema: Given sample of Anoro to try at last appointment. No evidence of airway obstruction on spirometry. She reports her breathing is worse with exposure to heat & humidity. She reports dyspnea is unchanged. Cough is producing a clear to "yellow-tinged" phlegm. She has had wheezing at times. She is using her rescue inhaler 3 times daily with exposure. She does report problems with dyspnea when she is exposed to perfumes and strong odors.   Tobacco use disorder: Previously no symptomatic help with Wellbutrin. Previously smoking one half pack per day at last appointment. Had prescription for Chantix at last appointment. She is down to less than 1/2 ppd. She ended up not trying Chantix given her negative effect experienced by her with Wellbutrin.   GERD: Prescribed Zantac. No reflux or dyspepsia. She is not currently using her Zantac. She has adjusted her diet. Takes Tums rarely. No morning brash water taste.   Voice Changes/Hoarseness: Referred to ENT at last appointment. She was found to have a vocal chord polyp that was removed. Her voice remains weak. She has f/u with ENT.  Review of Systems No fever, chills, or sweats. No chest pain or pressure except with her cough. No abdominal pain or nausea.   Allergies  Allergen Reactions  . Codeine Anaphylaxis  . Bupropion Other (See Comments)    Anger and mental issues    Current Outpatient Prescriptions on File Prior to Visit  Medication Sig Dispense Refill  . albuterol (PROVENTIL HFA;VENTOLIN HFA) 108 (90 Base) MCG/ACT inhaler Inhale 2 puffs into the lungs every 4 (four) hours as needed for wheezing or shortness of breath. 1 Inhaler 3  . amLODipine (NORVASC) 2.5 MG tablet Take 1 tablet (2.5 mg total) by mouth daily. Stop taking  Amlodipine 10mg . 90 tablet 2  . ANORO ELLIPTA 62.5-25 MCG/INH AEPB Inhale 1 puff into the lungs daily.  0  . aspirin EC 81 MG tablet Take 81 mg by mouth daily.    Marland Kitchen losartan (COZAAR) 100 MG tablet TAKE ONE TABLET BY MOUTH ONCE DAILY 90 tablet 1  . meloxicam (MOBIC) 7.5 MG tablet Take 1 tablet (7.5 mg total) by mouth 2 (two) times daily as needed for pain. 30 tablet 0  . Multiple Vitamin (MULTIVITAMIN) tablet Take 1 tablet by mouth daily.    . ondansetron (ZOFRAN ODT) 4 MG disintegrating tablet Take 1 tablet (4 mg total) by mouth every 8 (eight) hours as needed for nausea or vomiting. 15 tablet 0  . Spacer/Aero-Holding Chambers (AEROCHAMBER MV) inhaler Use as instructed 1 each 0  . verapamil (VERELAN PM) 180 MG 24 hr capsule TAKE ONE TABLET BY MOUTH ONCE DAILY 90 capsule 1   No current facility-administered medications on file prior to visit.     Past Medical History:  Diagnosis Date  . Allergy   . Anginal pain ([place])   . Arthritis   . Cancer ([place])    skin  . Closed fracture of right patella   . GERD (gastroesophageal reflux disease)   . Heart attack ([place]) 1999  . Hypertension   . Seborrheic keratoses   . Vocal cord polyp     Past Surgical History:  Procedure Laterality Date  . BREAST SURGERY     Left Breast nodule markers  . CARDIAC CATHETERIZATION  [place] cath    . MICROLARYNGOSCOPY WITH LASER Bilateral 11/13/2016   Procedure: MICROLARYNGOSCOPY;  Surgeon: Melissa Montane, MD;  Location: [facility];  Service: ENT;  Laterality: Bilateral;  . SKIN BIOPSY     nose  . TUBAL LIGATION      Family History  Problem Relation Age of Onset  . Diabetes Mother   . Hypertension Mother   . Stroke Mother   . Alcohol abuse Father   . Multiple sclerosis Sister   . Cancer Brother        lung  . Stroke Maternal Grandmother   . Heart attack Maternal Grandmother     Social History   Social History  . Marital status: Divorced    Spouse name: N/A  . Number of children: N/A  .  Years of education: N/A   Social History Main Topics  . Smoking status: Current Every Day Smoker    Packs/day: 0.50    Years: 40.00    Types: Cigarettes    Start date: 09/27/1976  . Smokeless tobacco: Never Used     Comment: Peak rate of 1ppd  . Alcohol use No  . Drug use: No  . Sexual activity: No   Other Topics Concern  . None   Social History Narrative   [facility] (06/30/16):   Originally from [place]. Moved to [facility] in April 2016. 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 Currently lives with her daughter & son-in-law in a mobile home. Has a cat currently. No mold or TB exposure. Always had a negative skin PPD. Previously enjoyed crocheting.       Objective:   Physical Exam BP 130/74 (BP Location: Right Arm, Cuff Size: Normal)   Pulse 83   Ht 5\' 1"  (1.549 m)   Wt 141 lb 4 oz (64.1 kg)   SpO2 95%   BMI 26.69 kg/m   General:  Caucasian female. No distress. Comfortable. Integument:  Warm. Dry. No rash on exposed skin. Extremities:  No cyanosis or clubbing.  HEENT:  No scleral icterus. No nasal turbinate swelling. Moist mucous membranes. Cardiovascular:  Regular rate. No edema. Unable to appreciate JVD.  Pulmonary:  Symmetrically decreased breath sounds with prolonged exhalation phase. Otherwise clear to auscultation. Abdomen: Soft. Normal bowel sounds. Nondistended.  Musculoskeletal:  Normal bulk and tone. No joint deformity or effusion appreciated.  PFT 01/12/17: FVC 2.78 L (94%) FEV1 2.12 L (93%) FEV1/FVC 0.76 FEF 25-75 1.78 L (82%)                                                                                                                                DLCO corrected 79% 10/03/16: FVC 2.61 L (80%) FEV1 2.02 L (80%) FEV1/FVC 0.77 FEF 25-75 1.71 L (78%) negative bronchodilator response TLC 5.38 L (117%) RV 136% ERV 81% DLCO corrected 90% (patient had difficulty performing bronchodilator challenge)  6MWT 09/09/16:  Walked 240 meters /  Baseline Sat 98% on RA / Nadir Sat 94% on RA  IMAGING LD CHEST CT W/O 08/01/16 (per radiologist):  Lung-RADS Category 1, negative. Repeat CT in 1 year. Borderline ascending aortic aneurysm. Mild centrilobular emphysema.   LABS 09/09/16 Alpha-1 antitrypsin: MM (160)  05/26/16 CBC: 7.5/14.7/44.0/254 BMP: 140/4.2/103/23/15/0.49/89/9.9 LFT: 4.4/?/0.4/111/15/9    Assessment & Plan:  61 y.o. female with centrilobular pulmonary emphysema as well as tobacco use disorder. Patient's spirometry has significantly improved since previous testing based upon my review today. There still is no evidence of airway obstruction. Given her reaction/response to inhaled irritants I do question whether or not she may have some element of an asthmatic phenotype. Her reflux seems to be reasonably controlled at this time. We did discuss her tobacco use at length as well as the need for complete cessation before her lungs can recovered. I instructed the patient to contact my office if she had any new breathing problems before her next appointment.  1. Centrilobular emphysema:  Trying patient on Trelegy in place of Anoro. She will contact me for a prescription if this is more effective. 2. Tobacco use disorder: Patient counseled for over 3 minutes and need for complete tobacco cessation. 3. GERD: Continuing dietary modification. 4. Voice changes:  Has follow-up with ENT.  5. Health maintenance: Status post Pneumovax February 2018 & Tdap February 2018. Declined influenza vaccine. 6. Follow-up: Return to clinic in 3 months or sooner if needed.  Sonia Baller Ashok Cordia, M.D. [facility] Pager:  [phone] After 3pm or if no response, call [phone] 4:29 PM 01/12/17

## 2017-01-12 NOTE — Progress Notes (Signed)
PFT completed today 01/12/17.

## 2017-01-12 NOTE — Patient Instructions (Addendum)
   Continue working on cutting back on your cigarette use.  Try using the Trelegy inhaler sample we are giving you today in place of your Anoro inhaler. Inhale one puff once daily. If this seems to help your breathing & coughing more than Anoro call and we will phone in a prescription.  Remember to remove any dentures or partials you have before you use your Trelegy inhaler. Remember to brush your teeth & tongue after you use your inhaler as well as rinse, gargle & spit to keep from getting thrush in your mouth or on your tongue (a white film).   We will see you back in 3 months or sooner if needed.

## 2017-01-14 ENCOUNTER — Ambulatory Visit (INDEPENDENT_AMBULATORY_CARE_PROVIDER_SITE_OTHER): Payer: BLUE CROSS/BLUE SHIELD | Admitting: Obstetrics & Gynecology

## 2017-01-14 ENCOUNTER — Encounter: Payer: Self-pay | Admitting: Obstetrics & Gynecology

## 2017-01-14 ENCOUNTER — Other Ambulatory Visit: Payer: Self-pay | Admitting: Gynecology

## 2017-01-14 VITALS — BP 140/88 | Ht 60.5 in | Wt 138.0 lb

## 2017-01-14 DIAGNOSIS — N950 Postmenopausal bleeding: Secondary | ICD-10-CM

## 2017-01-14 DIAGNOSIS — Z720 Tobacco use: Secondary | ICD-10-CM

## 2017-01-14 DIAGNOSIS — Z01419 Encounter for gynecological examination (general) (routine) without abnormal findings: Secondary | ICD-10-CM

## 2017-01-14 DIAGNOSIS — N644 Mastodynia: Secondary | ICD-10-CM

## 2017-01-14 DIAGNOSIS — Z13820 Encounter for screening for osteoporosis: Secondary | ICD-10-CM

## 2017-01-14 DIAGNOSIS — Z780 Asymptomatic menopausal state: Secondary | ICD-10-CM

## 2017-01-14 NOTE — Progress Notes (Signed)
Heidi Foster 1955/12/06 [id number]   History:    61 y.o. G1P1 Divorced.  Living with her son, daughter-in-law and [place] who's almost 72 yo.  RP:  New patient presenting for annual gyn exam   HPI:  Menopause.  No HRT.  2 recent episode of very mild vaginal spotting.  No pelvic pain.  No vaginal d/c. Not sexually active.  Breasts no lump felt, but c/o Mid external quadrant Left breast tenderness.  Left Breast Mammo showed Ca++ 08/22/2016.  Bx Fibrocystic changes 08/25/2016.  F/U Dx Left Breast Mammo scheduled next week.  Mictions/BMs wnl.  Cigarette smoker.  Past medical history,surgical history, family history and social history were all reviewed and documented in the EPIC chart.  Gynecologic History No LMP recorded. Patient is postmenopausal. Contraception: post menopausal status Last Pap: ?22 yrs ago. Results were: normal Last mammogram: 07/2016. Results were: abnormal, Bx benign Fibrocystic changes.  Obstetric History OB History  Gravida Para Term Preterm AB Living  1 1       1   SAB TAB Ectopic Multiple Live Births               # Outcome Date GA Lbr Len/2nd Weight Sex Delivery Anes PTL Lv  1 Para                ROS: A ROS was performed and pertinent positives and negatives are included in the history.  GENERAL: No fevers or chills. HEENT: No change in vision, no earache, sore throat or sinus congestion. NECK: No pain or stiffness. CARDIOVASCULAR: No chest pain or pressure. No palpitations. PULMONARY: No shortness of breath, cough or wheeze. GASTROINTESTINAL: No abdominal pain, nausea, vomiting or diarrhea, melena or bright red blood per rectum. GENITOURINARY: No urinary frequency, urgency, hesitancy or dysuria. MUSCULOSKELETAL: No joint or muscle pain, no back pain, no recent trauma. DERMATOLOGIC: No rash, no itching, no lesions. ENDOCRINE: No polyuria, polydipsia, no heat or cold intolerance. No recent change in weight. HEMATOLOGICAL: No anemia or easy bruising or bleeding.  NEUROLOGIC: No headache, seizures, numbness, tingling or weakness. PSYCHIATRIC: No depression, no loss of interest in normal activity or change in sleep pattern.     Exam:   BP 140/88   Ht 5' 0.5" (1.537 m)   Wt 138 lb (62.6 kg)   BMI 26.51 kg/m   Body mass index is 26.51 kg/m.  General appearance : Well developed well nourished female. No acute distress HEENT: Eyes: no retinal hemorrhage or exudates,  Neck supple, trachea midline, no carotid bruits, no thyroidmegaly Lungs: Clear to auscultation, no rhonchi or wheezes, or rib retractions  Heart: Regular rate and rhythm, no murmurs or gallops Breast:Examined in sitting and supine position were symmetrical in appearance, no palpable masses or tenderness on Rt breast, but slightly Fibrocystic gland pattern, and tender and more Fibrocystic at 3 O'Clock Left breast,  no skin retraction, no nipple inversion, no nipple discharge, no skin discoloration, no axillary or supraclavicular lymphadenopathy Abdomen: no palpable masses or tenderness, no rebound or guarding Extremities: no edema or skin discoloration or tenderness  Pelvic: Vulva normal  Bartholin, Urethra, Skene Glands: Within normal limits             Vagina: No gross lesions or discharge  Cervix: No gross lesions or discharge.  Pap reflex done.  Uterus  AV, normal size, shape and consistency, non-tender and mobile  Adnexa  Without masses or tenderness  Anus and perineum  normal     Assessment/Plan:  61 y.o.  female for annual exam   1. Encounter for routine gynecological examination with Papanicolaou smear of cervix Normal gyn exam, but Fibrocystic Breasts Lt>Rt.  Pap reflex done.  2. Menopause present No HRT.  Normal exam, but recent mild PMB, spotting.  Will f/u Pelvic US. - DG Bone Density; Future  3. PMB (postmenopausal bleeding) F/U Pelvic US for assessment of Endometrial line, possible EBx.  R/O Endometrial pathology as Myoma/Polyp/Endometrial Hyperplasia/Endometrial  Ca. - US Transvaginal Non-OB; Future  4. Breast tenderness in female Fibrocystic Breasts Lt>Rt.  F/U Left Dx Mammo scheduled.  5. Tobacco consumption Strongly recommend weaning and cessation.    Counseling on above issues >50% x 10 minutes.  Princess Bruins MD, 3:20 PM 01/14/2017

## 2017-01-15 ENCOUNTER — Ambulatory Visit
Admission: RE | Admit: 2017-01-15 | Discharge: 2017-01-15 | Disposition: A | Discharge status: Home / Self Care | Payer: BLUE CROSS/BLUE SHIELD | Source: Ambulatory Visit | Attending: Adult Health | Admitting: Adult Health

## 2017-01-15 ENCOUNTER — Other Ambulatory Visit: Payer: Self-pay | Admitting: Adult Health

## 2017-01-15 DIAGNOSIS — N63 Unspecified lump in unspecified breast: Secondary | ICD-10-CM

## 2017-01-15 DIAGNOSIS — N6012 Diffuse cystic mastopathy of left breast: Secondary | ICD-10-CM

## 2017-01-15 DIAGNOSIS — N644 Mastodynia: Secondary | ICD-10-CM

## 2017-01-15 LAB — PAP IG W/ RFLX HPV ASCU

## 2017-01-18 NOTE — Patient Instructions (Signed)
1. Encounter for routine gynecological examination with Papanicolaou smear of cervix Normal gyn exam, but Fibrocystic Breasts Lt>Rt.  Pap reflex done.  2. Menopause present No HRT.  Normal exam, but recent mild PMB, spotting.  Will f/u Pelvic US. - DG Bone Density; Future  3. PMB (postmenopausal bleeding) F/U Pelvic US for assessment of Endometrial line, possible EBx.  R/O Endometrial pathology as Myoma/Polyp/Endometrial Hyperplasia/Endometrial Ca. - US Transvaginal Non-OB; Future  4. Breast tenderness in female Fibrocystic Breasts Lt>Rt.  F/U Left Dx Mammo scheduled.  5. Tobacco consumption Strongly recommend weaning and cessation.    Heidi Foster, it was a pleasure to meet you today!  I will inform you of your results as soon as available.  See you soon for the Pelvic US.  Postmenopausal Bleeding Postmenopausal bleeding is any bleeding a woman has after she has entered into menopause. Menopause is the end of a woman's fertile years. After menopause, a woman no longer ovulates or has menstrual periods. Postmenopausal bleeding can be caused by various things. Any type of postmenopausal bleeding, even if it appears to be a typical menstrual period, is concerning. This should be evaluated by your health care provider. Any treatment will depend on the cause of the bleeding. Follow these instructions at home: Monitor your condition for any changes. The following actions may help to alleviate any discomfort you are experiencing:  Avoid the use of tampons and douches as directed by your health care provider.  Change your pads frequently.  Get regular pelvic exams and Pap tests.  Keep all follow-up appointments for diagnostic tests as directed by your health care provider.  Contact a health care provider if:  Your bleeding lasts more than 1 week.  You have abdominal pain.  You have bleeding with sexual intercourse. Get help right away if:  You have a fever, chills, headache, dizziness,  muscle aches, and bleeding.  You have severe pain with bleeding.  You are passing blood clots.  You have bleeding and need more than 1 pad an hour.  You feel faint. This information is not intended to replace advice given to you by your health care provider. Make sure you discuss any questions you have with your health care provider. Document Released: 07/23/2005 Document Revised: 09/20/2015 Document Reviewed: 11/11/2012 Elsevier Interactive Patient Education  [employer].

## 2017-01-19 ENCOUNTER — Ambulatory Visit (INDEPENDENT_AMBULATORY_CARE_PROVIDER_SITE_OTHER): Payer: BLUE CROSS/BLUE SHIELD

## 2017-01-19 ENCOUNTER — Other Ambulatory Visit: Payer: Self-pay | Admitting: Gynecology

## 2017-01-19 DIAGNOSIS — M8589 Other specified disorders of bone density and structure, multiple sites: Secondary | ICD-10-CM | POA: Diagnosis not present

## 2017-01-19 DIAGNOSIS — Z13820 Encounter for screening for osteoporosis: Secondary | ICD-10-CM

## 2017-01-20 ENCOUNTER — Encounter: Payer: Self-pay | Admitting: Gynecology

## 2017-01-21 NOTE — Progress Notes (Signed)
Subjective:    Patient ID: Heidi Foster, female    DOB: Feb 23, 1956, 61 y.o.   MRN: [id number]  HPI:  Heidi Foster is here for CPE: PMH: HTN, GERD, Dysphagia, HL, tobacco use disorder,and  Fatigue.  10/08/16:Transnasal Laryngoscopy, impression: Bilateral vocal cord polyps.    11/13/16:She has MICROLARYNGOSCOPY WITH LASER.  01/12/17: Pulmonology visit, records reviewed and sig points: Centrilobular emphysema, started on Trelegy in place of Anoro.  Smoking cessation discussed and she is self-reducing tobacco use. LD CHEST CT W/O 08/01/16 (per radiologist):  Lung-RADS Category 1, negative. Repeat CT in 1 year. Borderline ascending aortic aneurysm. Mild centrilobular emphysema.  Instructed to f/u in 3 months.  01/14/17: GYN visit: PAP completed.  Fibrocystic Breasts L>R, she has f/u L dx mammogram on 02/04/17 F/u with GYN 02/04/17  She is down to < half/pack cigarettes/day-GREAT. She continues to have dyspnea and reports that she can only ambulate 200 yards without needing to stop/rest. She also reports increase in lumbar back pain and bil lower extremity pain and tingling. She cannot exercise or work due to "my breathing and pain and I am waiting on my disability claim".   She denies anxiety/depression or thoughts of harming herself/others. She continues to stressed that she is completely financially dependent on her son and daughter-in-law.  Healthcare Maintenance: PAP-completed last week Mammogram- completed in Spring and f/u imaging scheduled 02/04/17 CT- completed 07/2016, repeat in 1 year Colonoscopy- has never had one and order has been placed.  Patient Care Team    Relationship Specialty Notifications Start End  Esaw Grandchild, NP PCP - General Family Medicine  05/26/16     Patient Active Problem List   Diagnosis Date Noted  . Fibromyalgia 01/22/2017  . Breast pain, left 12/15/2016  . Generalized weakness 12/15/2016  . Leg pain, bilateral 12/15/2016  . 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   . Centrilobular emphysema ([place]) 09/09/2016  . GERD (gastroesophageal reflux disease) 06/30/2016  . Dysphagia 06/30/2016  . Health care maintenance 06/25/2016  . Other fatigue 05/26/2016  . Hypertension 05/26/2016  . Other hyperlipidemia 05/26/2016  . Family history of diabetes mellitus in mother 05/26/2016  . Acute pain of right shoulder 05/26/2016  . Tobacco use disorder 05/26/2016  . Dyspnea on exertion 05/26/2016  . Chronic pain of right knee 05/26/2016  . History of MI (myocardial infarction) 05/26/2016  . History of TIA (transient ischemic attack) 05/26/2016  . Numbness and tingling of left lower extremity 05/26/2016  . Numbness and tingling in left hand 05/26/2016  . 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 Allergy   . Anginal pain ([place])   . Arthritis   . Cancer ([place])    skin  . Closed fracture of right patella   . GERD (gastroesophageal reflux disease)   . Heart attack ([place]) 1999  . Hypertension   . Osteopenia 12/2016   T score -1.2 FRAX 7.4%/0.8%  . Seborrheic keratoses   . Vocal cord polyp      Past Surgical History:  Procedure Laterality Date  . BREAST BIOPSY    . BREAST SURGERY     Left Breast nodule markers  . CARDIAC CATHETERIZATION     1999  . caridac cath    . MICROLARYNGOSCOPY WITH LASER Bilateral 11/13/2016   Procedure: MICROLARYNGOSCOPY;  Surgeon: Melissa Montane, MD;  Location: [facility] OR;  Service: ENT;  Laterality: Bilateral;  . polyp removal  10/2016   throat -   . SKIN BIOPSY  nose  . TUBAL LIGATION       Family History  Problem Relation Age of Onset  . Diabetes Mother   . Hypertension Mother   . Stroke Mother   . Alcohol abuse Father   . Multiple sclerosis Sister   . Cancer Brother        lung  . Stroke Maternal Grandmother   . Heart attack Maternal Grandmother   . Hypertension Maternal Grandmother      History  Drug Use No     History  Alcohol Use No     History  Smoking Status  .  Current Every Day Smoker  . Packs/day: 0.50  . Years: 40.00  . Types: Cigarettes  . Start date: 09/27/1976  Smokeless Tobacco  . Never Used    Comment: Peak rate of 1ppd     Outpatient Encounter Prescriptions as of 01/22/2017  Medication Sig  . albuterol (PROVENTIL HFA;VENTOLIN HFA) 108 (90 Base) MCG/ACT inhaler Inhale 2 puffs into the lungs every 4 (four) hours as needed for wheezing or shortness of breath.  Marland Kitchen amLODipine (NORVASC) 2.5 MG tablet Take 1 tablet (2.5 mg total) by mouth daily. Stop taking Amlodipine 10mg .  . ANORO ELLIPTA 62.5-25 MCG/INH AEPB Inhale 1 puff into the lungs daily.  Marland Kitchen aspirin EC 81 MG tablet Take 81 mg by mouth daily.  Marland Kitchen aspirin-acetaminophen-caffeine (EXCEDRIN MIGRAINE) 250-250-65 MG tablet Take 1 tablet by mouth daily as needed for headache.  . Fluticasone-Umeclidin-Vilant (TRELEGY ELLIPTA) 100-62.5-25 MCG/INH AEPB Inhale 1 puff into the lungs daily.  Marland Kitchen losartan (COZAAR) 100 MG tablet TAKE ONE TABLET BY MOUTH ONCE DAILY  . Spacer/Aero-Holding Chambers (AEROCHAMBER MV) inhaler Use as instructed  . verapamil (VERELAN PM) 180 MG 24 hr capsule TAKE ONE TABLET BY MOUTH ONCE DAILY  . [DISCONTINUED] meloxicam (MOBIC) 7.5 MG tablet Take 1 tablet (7.5 mg total) by mouth 2 (two) times daily as needed for pain.  . [DISCONTINUED] ondansetron (ZOFRAN ODT) 4 MG disintegrating tablet Take 1 tablet (4 mg total) by mouth every 8 (eight) hours as needed for nausea or vomiting.  . DULoxetine (CYMBALTA) 30 MG capsule 1 tablet daily for first week, then increase to two tabs once daily.  . [DISCONTINUED] DULoxetine (CYMBALTA) 60 MG capsule Take 1 capsule (60 mg total) by mouth daily. 1/2 tablet daily for the first week, then increase full tablet daily.  . [DISCONTINUED] Multiple Vitamin (MULTIVITAMIN) tablet Take 1 tablet by mouth daily.   No facility-administered encounter medications on file as of 01/22/2017.     Allergies: Codeine and Bupropion  Body mass index is 26.83  kg/m.  Blood pressure 128/75, pulse 76, height 5' 0.5" (1.537 m), weight 139 lb 11.2 oz (63.4 kg).   Review of Systems  Constitutional: Positive for fatigue. Negative for activity change, appetite change, chills, diaphoresis and fever.  HENT: Positive for voice change. Negative for congestion, postnasal drip and trouble swallowing.   Eyes: Negative for visual disturbance.  Respiratory: Positive for cough and shortness of breath. Negative for chest tightness, wheezing and stridor.   Cardiovascular: Negative for chest pain, palpitations and leg swelling.  Gastrointestinal: Negative for abdominal distention, abdominal pain, blood in stool, constipation, nausea and vomiting.  Endocrine: Negative for cold intolerance, heat intolerance, polydipsia, polyphagia and polyuria.  Genitourinary: Negative for flank pain and hematuria.  Musculoskeletal: Positive for arthralgias, back pain, gait problem, joint swelling, myalgias, neck pain and neck stiffness.  Skin: Negative for color change, pallor, rash and wound.  Neurological: Negative for dizziness, weakness  and headaches.  Hematological: Does not bruise/bleed easily.  Psychiatric/Behavioral: Positive for sleep disturbance.       4 pillow orthopnea       Objective:   Physical Exam  Constitutional: She is oriented to person, place, and time. She appears well-developed and well-nourished. No distress.  HENT:  Head: Normocephalic and atraumatic.  Right Ear: Tympanic membrane, external ear and ear canal normal. Tympanic membrane is not erythematous and not bulging. Decreased hearing is noted.  Left Ear: Tympanic membrane, external ear and ear canal normal. Tympanic membrane is not erythematous and not bulging. Decreased hearing is noted.  Nose: Right sinus exhibits no maxillary sinus tenderness and no frontal sinus tenderness. Left sinus exhibits no maxillary sinus tenderness and no frontal sinus tenderness.  Mouth/Throat: Oropharynx is clear and  moist and mucous membranes are normal. Abnormal dentition. Dental caries present.  Eyes: Pupils are equal, round, and reactive to light. Conjunctivae are normal.  Neck: Normal range of motion.  Cardiovascular: Normal rate, normal heart sounds and intact distal pulses.   No murmur heard. Pulmonary/Chest: Effort normal. No respiratory distress. She has decreased breath sounds in the right lower field and the left lower field. She has no wheezes. She has no rhonchi. She has no rales. She exhibits no tenderness. Right breast exhibits no inverted nipple, no mass, no nipple discharge and no skin change. Left breast exhibits no inverted nipple, no nipple discharge and no skin change.  Firmer tissue noted on L breast at 3' 0 clock position, no puckering noted.  Abdominal: Soft. Bowel sounds are normal. She exhibits no distension and no mass. There is no tenderness. There is no rebound and no guarding.  Genitourinary:  Genitourinary Comments: PAP completed last week with OB/GYN  Musculoskeletal: She exhibits tenderness. She exhibits no deformity.       Right hip: She exhibits tenderness.       Left hip: She exhibits tenderness.       Right knee: Tenderness found.       Left knee: Tenderness found.       Right ankle: Tenderness.       Left ankle: Tenderness.       Thoracic back: She exhibits tenderness.       Lumbar back: She exhibits tenderness.  Lymphadenopathy:    She has no cervical adenopathy.  Neurological: She is alert and oriented to person, place, and time. Coordination normal.  Skin: Skin is warm and dry. No rash noted. She is not diaphoretic. No erythema. No pallor.  Psychiatric: She has a normal mood and affect. Her behavior is normal. Judgment and thought content normal.  Nursing note and vitals reviewed.         Assessment & Plan:   1. Screening for colon cancer   2. Health care maintenance   3. Essential hypertension   4. Vocal cord polyps   5. Gastroesophageal reflux  disease, esophagitis presence not specified   6. Fibromyalgia     Health care maintenance Continue all medications as directed. Start Duloxetine (Cymblata) 30mg - 1 tablet daily for the first week then increase to two tabs once daily- maintenance dose 60mg  daily. Continue Epson salt baths and stretch several times daily. Continue to push water and eat heart healthy diet. Continue to reduce-stop tobacco use- YOU CAN DO IT!!! Colonoscopy order placed. Please follow-up with your various specialists as directed. Follow-up in 6 weeks to assess effectiveness of new medication. PAP/Mammogram- completed CT of chest competed 08/01/16- repeat in 1 year per  pulmonology.   Hypertension BP at goal 128/75, HR 76 Verapamil 180mg , Losartan 100mg , Amlodipine 2.5mg - daily Denies current cardiac sx's, followed by cards/Dr. Gwenlyn Found  Vocal cord polyps Will follow up ENT/surgeon this fall.  GERD (gastroesophageal reflux disease) Diet controlled.  Fibromyalgia Hx of tobacco abuse and diffuse pain of back/bil lower extremities. Started on Duloxetine 30mg  daily for 7 days then increased to mx dose of 60mg  daily. Daily stretching and continue Epson salt baths. F/u in  6 weeks to evaluate medication effectivenss. Denies thoughts of harming herself/others.     FOLLOW-UP:  Return in about 6 weeks (around 03/05/2017).

## 2017-01-22 ENCOUNTER — Encounter: Payer: Self-pay | Admitting: Adult Health

## 2017-01-22 ENCOUNTER — Ambulatory Visit (INDEPENDENT_AMBULATORY_CARE_PROVIDER_SITE_OTHER): Payer: BLUE CROSS/BLUE SHIELD | Admitting: Adult Health

## 2017-01-22 VITALS — BP 128/75 | HR 76 | Ht 60.5 in | Wt 139.7 lb

## 2017-01-22 DIAGNOSIS — I10 Essential (primary) hypertension: Secondary | ICD-10-CM | POA: Diagnosis not present

## 2017-01-22 DIAGNOSIS — K219 Gastro-esophageal reflux disease without esophagitis: Secondary | ICD-10-CM

## 2017-01-22 DIAGNOSIS — Z0000 Encounter for general adult medical examination without abnormal findings: Secondary | ICD-10-CM | POA: Diagnosis not present

## 2017-01-22 DIAGNOSIS — Z1211 Encounter for screening for malignant neoplasm of colon: Secondary | ICD-10-CM

## 2017-01-22 DIAGNOSIS — J381 Polyp of vocal cord and larynx: Secondary | ICD-10-CM

## 2017-01-22 DIAGNOSIS — M797 Fibromyalgia: Secondary | ICD-10-CM

## 2017-01-22 MED ORDER — DULOXETINE HCL 60 MG PO CPEP: 60.0000 mg | ORAL_CAPSULE | Freq: Every day | ORAL | 0 refills | Status: DC

## 2017-01-22 MED ORDER — DULOXETINE HCL 30 MG PO CPEP: ORAL_CAPSULE | ORAL | 0 refills | Status: DC

## 2017-01-22 NOTE — Assessment & Plan Note (Signed)
Will follow up ENT/surgeon this fall.

## 2017-01-22 NOTE — Assessment & Plan Note (Signed)
BP at goal 128/75, HR 76 Verapamil 180mg , Losartan 100mg , Amlodipine 2.5mg - daily Denies current cardiac sx's, followed by cards/Dr. Gwenlyn Found

## 2017-01-22 NOTE — Assessment & Plan Note (Signed)
Hx of tobacco abuse and diffuse pain of back/bil lower extremities. Started on Duloxetine 30mg  daily for 7 days then increased to mx dose of 60mg  daily. Daily stretching and continue Epson salt baths. F/u in  6 weeks to evaluate medication effectivenss. Denies thoughts of harming herself/others.

## 2017-01-22 NOTE — Assessment & Plan Note (Signed)
Continue all medications as directed. Start Duloxetine (Cymblata) 30mg - 1 tablet daily for the first week then increase to two tabs once daily- maintenance dose 60mg  daily. Continue Epson salt baths and stretch several times daily. Continue to push water and eat heart healthy diet. Continue to reduce-stop tobacco use- YOU CAN DO IT!!! Colonoscopy order placed. Please follow-up with your various specialists as directed. Follow-up in 6 weeks to assess effectiveness of new medication. PAP/Mammogram- completed CT of chest competed 08/01/16- repeat in 1 year per pulmonology.

## 2017-01-22 NOTE — Patient Instructions (Addendum)
Heart-Healthy Eating Plan Many factors influence your heart health, including eating and exercise habits. Heart (coronary) risk increases with abnormal blood fat (lipid) levels. Heart-healthy meal planning includes limiting unhealthy fats, increasing healthy fats, and making other small dietary changes. This includes maintaining a healthy body weight to help keep lipid levels within a normal range. What is my plan? Your health care provider recommends that you:  Get no more than _25_% of the total calories in your daily diet from fat.  Limit your intake of saturated fat to less than __5__% of your total calories each day.  Limit the amount of cholesterol in your diet to less than _300__ mg per day.  What types of fat should I choose?  Choose healthy fats more often. Choose monounsaturated and polyunsaturated fats, such as olive oil and canola oil, flaxseeds, walnuts, almonds, and seeds.  Eat more omega-3 fats. Good choices include salmon, mackerel, sardines, tuna, flaxseed oil, and ground flaxseeds. Aim to eat fish at least two times each week.  Limit saturated fats. Saturated fats are primarily found in animal products, such as meats, butter, and cream. Plant sources of saturated fats include palm oil, palm kernel oil, and coconut oil.  Avoid foods with partially hydrogenated oils in them. These contain trans fats. Examples of foods that contain trans fats are stick margarine, some tub margarines, cookies, crackers, and other baked goods. What general guidelines do I need to follow?  Check food labels carefully to identify foods with trans fats or high amounts of saturated fat.  Fill one half of your plate with vegetables and green salads. Eat 4-5 servings of vegetables per day. A serving of vegetables equals 1 cup of raw leafy vegetables,  cup of raw or cooked cut-up vegetables, or  cup of vegetable juice.  Fill one fourth of your plate with whole grains. Look for the word "whole" as  the first word in the ingredient list.  Fill one fourth of your plate with lean protein foods.  Eat 4-5 servings of fruit per day. A serving of fruit equals one medium whole fruit,  cup of dried fruit,  cup of fresh, frozen, or canned fruit, or  cup of 100% fruit juice.  Eat more foods that contain soluble fiber. Examples of foods that contain this type of fiber are apples, broccoli, carrots, beans, peas, and barley. Aim to get 20-30 g of fiber per day.  Eat more home-cooked food and less restaurant, buffet, and fast food.  Limit or avoid alcohol.  Limit foods that are high in starch and sugar.  Avoid fried foods.  Cook foods by using methods other than frying. Baking, boiling, grilling, and broiling are all great options. Other fat-reducing suggestions include: ? Removing the skin from poultry. ? Removing all visible fats from meats. ? Skimming the fat off of stews, soups, and gravies before serving them. ? Steaming vegetables in water or broth.  Lose weight if you are overweight. Losing just 5-10% of your initial body weight can help your overall health and prevent diseases such as diabetes and heart disease.  Increase your consumption of nuts, legumes, and seeds to 4-5 servings per week. One serving of dried beans or legumes equals  cup after being cooked, one serving of nuts equals 1 ounces, and one serving of seeds equals  ounce or 1 tablespoon.  You may need to monitor your salt (sodium) intake, especially if you have high blood pressure. Talk with your health care provider or dietitian to get  more information about reducing sodium. What foods can I eat? Grains  Breads, including [place], white, pita, wheat, raisin, rye, oatmeal, and [place]. Tortillas that are neither fried nor made with lard or trans fat. Low-fat rolls, including hotdog and hamburger buns and English muffins. Biscuits. Muffins. Waffles. Pancakes. Light popcorn. Whole-grain cereals. Flatbread. Melba toast.  Pretzels. Breadsticks. Rusks. Low-fat snacks and crackers, including oyster, saltine, matzo, graham, animal, and rye. Rice and pasta, including brown rice and those that are made with whole wheat. Vegetables All vegetables. Fruits All fruits, but limit coconut. Meats and Other Protein Sources Lean, well-trimmed beef, veal, pork, and lamb. Chicken and [place] without skin. All fish and shellfish. Wild duck, rabbit, pheasant, and venison. Egg whites or low-cholesterol egg substitutes. Dried beans, peas, lentils, and tofu.Seeds and most nuts. Dairy Low-fat or nonfat cheeses, including ricotta, string, and mozzarella. Skim or 1% milk that is liquid, powdered, or evaporated. Buttermilk that is made with low-fat milk. Nonfat or low-fat yogurt. Beverages Mineral water. Diet carbonated beverages. Sweets and Desserts Sherbets and fruit ices. Honey, jam, marmalade, jelly, and syrups. Meringues and gelatins. Pure sugar candy, such as hard candy, jelly beans, gumdrops, mints, marshmallows, and small amounts of dark chocolate. [employer] Eat all sweets and desserts in moderation. Fats and Oils Nonhydrogenated (trans-free) margarines. Vegetable oils, including soybean, sesame, sunflower, olive, peanut, safflower, corn, canola, and cottonseed. Salad dressings or mayonnaise that are made with a vegetable oil. Limit added fats and oils that you use for cooking, baking, salads, and as spreads. Other Cocoa powder. Coffee and tea. All seasonings and condiments. The items listed above may not be a complete list of recommended foods or beverages. Contact your dietitian for more options. What foods are not recommended? Grains Breads that are made with saturated or trans fats, oils, or whole milk. Croissants. Butter rolls. Cheese breads. Sweet rolls. Donuts. Buttered popcorn. Chow mein noodles. High-fat crackers, such as cheese or butter crackers. Meats and Other Protein Sources Fatty meats, such as hotdogs,  short ribs, sausage, spareribs, bacon, ribeye roast or steak, and mutton. High-fat deli meats, such as salami and bologna. Caviar. Domestic duck and goose. Organ meats, such as kidney, liver, sweetbreads, brains, gizzard, chitterlings, and heart. Dairy Cream, sour cream, cream cheese, and creamed cottage cheese. Whole milk cheeses, including blue (bleu), Monterey Jack, [place], [place], American, [place], Swiss, [place], [place], and [place]. Whole or 2% milk that is liquid, evaporated, or condensed. Whole buttermilk. Cream sauce or high-fat cheese sauce. Yogurt that is made from whole milk. Beverages Regular sodas and drinks with added sugar. Sweets and Desserts Frosting. Pudding. Cookies. Cakes other than angel food cake. Candy that has milk chocolate or white chocolate, hydrogenated fat, butter, coconut, or unknown ingredients. Buttered syrups. Full-fat ice cream or ice cream drinks. Fats and Oils Gravy that has suet, meat fat, or shortening. Cocoa butter, hydrogenated oils, palm oil, coconut oil, palm kernel oil. These can often be found in baked products, candy, fried foods, nondairy creamers, and whipped toppings. Solid fats and shortenings, including bacon fat, salt pork, lard, and butter. Nondairy cream substitutes, such as coffee creamers and sour cream substitutes. Salad dressings that are made of unknown oils, cheese, or sour cream. The items listed above may not be a complete list of foods and beverages to avoid. Contact your dietitian for more information. This information is not intended to replace advice given to you by your health care provider. Make sure you discuss any questions you have with your health care  provider. Document Released: 01/22/2008 Document Revised: 11/02/2015 Document Reviewed: 10/06/2013 Elsevier Interactive Patient Education  2017 [place].   Myofascial Pain Syndrome and Fibromyalgia Myofascial pain syndrome and fibromyalgia are both pain disorders. This pain  may be felt mainly in your muscles.  Myofascial pain syndrome: ? Always has trigger points or tender points in the muscle that will cause pain when pressed. The pain may come and go. ? Usually affects your neck, upper back, and shoulder areas. The pain often radiates into your arms and hands.  Fibromyalgia: ? Has muscle pains and tenderness that come and go. ? Is often associated with fatigue and sleep disturbances. ? Has trigger points. ? Tends to be long-lasting (chronic), but is not life-threatening.  Fibromyalgia and myofascial pain are not the same. However, they often occur together. If you have both conditions, each can make the other worse. Both are common and can cause enough pain and fatigue to make day-to-day activities difficult. What are the causes? The exact causes of fibromyalgia and myofascial pain are not known. People with certain gene types may be more likely to develop fibromyalgia. Some factors can be triggers for both conditions, such as:  Spine disorders.  Arthritis.  Severe injury (trauma) and other physical stressors.  Being under a lot of stress.  A medical illness.  What are the signs or symptoms? Fibromyalgia The main symptom of fibromyalgia is widespread pain and tenderness in your muscles. This can vary over time. Pain is sometimes described as stabbing, shooting, or burning. You may have tingling or numbness, too. You may also have sleep problems and fatigue. You may wake up feeling tired and groggy (fibro fog). Other symptoms may include:  Bowel and bladder problems.  Headaches.  Visual problems.  Problems with odors and noises.  Depression or mood changes.  Painful menstrual periods (dysmenorrhea).  Dry skin or eyes.  Myofascial pain syndrome Symptoms of myofascial pain syndrome include:  Tight, ropy bands of muscle.  Uncomfortable sensations in muscular areas, such  as: ? Aching. ? Cramping. ? Burning. ? Numbness. ? Tingling. ? Muscle weakness.  Trouble moving certain muscles freely (range of motion).  How is this diagnosed? There are no specific tests to diagnose fibromyalgia or myofascial pain syndrome. Both can be hard to diagnose because their symptoms are common in many other conditions. Your health care provider may suspect one or both of these conditions based on your symptoms and medical history. Your health care provider will also do a physical exam. The key to diagnosing fibromyalgia is having pain, fatigue, and other symptoms for more than three months that cannot be explained by another condition. The key to diagnosing myofascial pain syndrome is finding trigger points in muscles that are tender and cause pain elsewhere in your body (referred pain). How is this treated? Treating fibromyalgia and myofascial pain often requires a team of health care providers. This usually starts with your primary provider and a physical therapist. You may also find it helpful to work with alternative health care providers, such as massage therapists or acupuncturists. Treatment for fibromyalgia may include medicines. This may include nonsteroidal anti-inflammatory drugs (NSAIDs), along with other medicines. Treatment for myofascial pain may also include:  NSAIDs.  Cooling and stretching of muscles.  Trigger point injections.  Sound wave (ultrasound) treatments to stimulate muscles.  Follow these instructions at home:  Take medicines only as directed by your health care provider.  Exercise as directed by your health care provider or physical therapist.  Try  to avoid stressful situations.  Practice relaxation techniques to control your stress. You may want to try: ? Biofeedback. ? Visual imagery. ? Hypnosis. ? Muscle relaxation. ? Yoga. ? Meditation.  Talk to your health care provider about alternative treatments, such as acupuncture or  massage treatment.  Maintain a healthy lifestyle. This includes eating a healthy diet and getting enough sleep.  Consider joining a support group.  Do not do activities that stress or strain your muscles. That includes repetitive motions and heavy lifting. Where to find more information:  National Fibromyalgia Association: www.fmaware.[place]: www.arthritis.org  American Chronic Pain Association: [employer].dk Contact a health care provider if:  You have new symptoms.  Your symptoms get worse.  You have side effects from your medicines.  You have trouble sleeping.  Your condition is causing depression or anxiety. This information is not intended to replace advice given to you by your health care provider. Make sure you discuss any questions you have with your health care provider. Document Released: 04/14/2005 Document Revised: 09/20/2015 Document Reviewed: 01/18/2014 Elsevier Interactive Patient Education  2018 [place] all medications as directed. Start Duloxetine (Cymblata) 30mg - 1 tablet daily for the first week then increase to two tabs once daily- maintenance dose 60mg  daily. Continue [place]pson salt baths and stretch several times daily. Continue to push water and eat heart healthy diet. Continue to reduce-stop tobacco use- YOU CAN DO IT!!! Colonoscopy order placed. Please follow-up with your various specialists as directed. Follow-up in 6 weeks to assess effectiveness of new medication. NICE TO SEE YOU!

## 2017-01-22 NOTE — Assessment & Plan Note (Signed)
Diet controlled.  

## 2017-01-23 ENCOUNTER — Encounter: Payer: Self-pay | Admitting: Gastroenterology

## 2017-02-04 ENCOUNTER — Ambulatory Visit (INDEPENDENT_AMBULATORY_CARE_PROVIDER_SITE_OTHER): Payer: BLUE CROSS/BLUE SHIELD | Admitting: Obstetrics & Gynecology

## 2017-02-04 ENCOUNTER — Ambulatory Visit (INDEPENDENT_AMBULATORY_CARE_PROVIDER_SITE_OTHER): Payer: BLUE CROSS/BLUE SHIELD

## 2017-02-04 VITALS — BP 130/72

## 2017-02-04 DIAGNOSIS — N950 Postmenopausal bleeding: Secondary | ICD-10-CM

## 2017-02-04 DIAGNOSIS — Z23 Encounter for immunization: Secondary | ICD-10-CM | POA: Diagnosis not present

## 2017-02-04 NOTE — Addendum Note (Signed)
Addended by: Thurnell Garbe A on: 02/04/2017 04:32 PM   Modules accepted: Orders

## 2017-02-04 NOTE — Patient Instructions (Signed)
1. Postmenopausal bleeding Thin Endometrial line 1.6 mm.  Reassured.  US findings discussed, reassured.  Counseling on PMB, will call back if recurrence.  Encouraged to continue with smoking cessation efforts.  Flu shot given.  Heidi Foster, good to see you!  I will see you again for your next Annual/Gyn visit.

## 2017-02-04 NOTE — Progress Notes (Signed)
    Heidi Foster 1955-06-12 [id number]        61 y.o.  G1P1   RP:  Mild PMB/spotting for Pelvic US  HPI: No recurrence of vaginal spotting since last visit.  No pelvic pain.  Lt Dx Mammo/US benign 01/15/2017.  Recent Dx of Fibromyalgia, better on Meds.  Would like Flu shot.  Attempting smoking cessation.  Past medical history,surgical history, problem list, medications, allergies, family history and social history were all reviewed and documented in the EPIC chart.  Directed ROS with pertinent positives and negatives documented in the history of present illness/assessment and plan.  Exam:  Vitals:   02/04/17 1512  BP: 130/72   General appearance:  Normal  Pelvic US today:  T/V Anteverted uterus 6.43 x 4.67 x 3.52 cm with Endometrial line at 1.6 mm.  Left ovary normal.  Right ovary microCa++ and thin-walled echo-free simple cyst 5 x 5 mm.  No FF in CDS.  Assessment/Plan:  61 y.o. G1P1   1. Postmenopausal bleeding Thin Endometrial line 1.6 mm.  Reassured.  US findings discussed, reassured.  Counseling on PMB, will call back if recurrence.  Encouraged to continue with smoking cessation efforts.  Flu shot given.  Princess Bruins MD, 3:22 PM 02/04/2017

## 2017-02-09 ENCOUNTER — Ambulatory Visit (AMBULATORY_SURGERY_CENTER): Payer: Self-pay | Admitting: *Deleted

## 2017-02-09 VITALS — Ht 62.0 in | Wt 140.2 lb

## 2017-02-09 DIAGNOSIS — Z1211 Encounter for screening for malignant neoplasm of colon: Secondary | ICD-10-CM

## 2017-02-09 MED ORDER — NA SULFATE-K SULFATE-MG SULF 17.5-3.13-1.6 GM/177ML PO SOLN: 1.0000 [IU] | Freq: Once | ORAL | 0 refills | Status: AC

## 2017-02-09 NOTE — Progress Notes (Signed)
No egg or soy allergy known to patient  No issues with past sedation with any surgeries  or procedures, no intubation problems  No diet pills per patient No home 02 use per patient  No blood thinners per patient  Pt denies issues with constipation sometimes but raisins helps No A fib or A flutter  EMMI video sent to pt's e mail pt. denies

## 2017-02-10 ENCOUNTER — Encounter: Payer: Self-pay | Admitting: Gastroenterology

## 2017-02-12 ENCOUNTER — Telehealth: Payer: Self-pay | Admitting: Gastroenterology

## 2017-02-23 ENCOUNTER — Encounter: Payer: BLUE CROSS/BLUE SHIELD | Admitting: Gastroenterology

## 2017-03-09 ENCOUNTER — Ambulatory Visit (INDEPENDENT_AMBULATORY_CARE_PROVIDER_SITE_OTHER): Payer: BLUE CROSS/BLUE SHIELD | Admitting: Adult Health

## 2017-03-09 ENCOUNTER — Encounter: Payer: Self-pay | Admitting: Adult Health

## 2017-03-09 VITALS — BP 136/68 | HR 78 | Ht 60.75 in | Wt 141.1 lb

## 2017-03-09 DIAGNOSIS — M797 Fibromyalgia: Secondary | ICD-10-CM

## 2017-03-09 DIAGNOSIS — Z8673 Personal history of transient ischemic attack (TIA), and cerebral infarction without residual deficits: Secondary | ICD-10-CM | POA: Insufficient documentation

## 2017-03-09 DIAGNOSIS — F17200 Nicotine dependence, unspecified, uncomplicated: Secondary | ICD-10-CM | POA: Diagnosis not present

## 2017-03-09 DIAGNOSIS — E7849 Other hyperlipidemia: Secondary | ICD-10-CM

## 2017-03-09 DIAGNOSIS — R202 Paresthesia of skin: Secondary | ICD-10-CM

## 2017-03-09 DIAGNOSIS — I10 Essential (primary) hypertension: Secondary | ICD-10-CM | POA: Diagnosis not present

## 2017-03-09 MED ORDER — DULOXETINE HCL 30 MG PO CPEP: ORAL_CAPSULE | ORAL | 2 refills | Status: DC

## 2017-03-09 NOTE — Assessment & Plan Note (Signed)
Declined tobacco cessation today. Encouraged to continue to reduce-stop tobacco use.

## 2017-03-09 NOTE — Patient Instructions (Addendum)
Duloxetine delayed-release capsules What is this medicine? DULOXETINE (doo LOX e teen) is used to treat depression, anxiety, and different types of chronic pain. This medicine may be used for other purposes; ask your health care provider or pharmacist if you have questions. COMMON BRAND NAME(S): Cymbalta, Irenka What should I tell my health care provider before I take this medicine? They need to know if you have any of these conditions: -bipolar disorder or a family history of bipolar disorder -glaucoma -kidney disease -liver disease -suicidal thoughts or a previous suicide attempt -taken medicines called MAOIs like Carbex, Eldepryl, Marplan, Nardil, and Parnate within 14 days -an unusual reaction to duloxetine, other medicines, foods, dyes, or preservatives -pregnant or trying to get pregnant -breast-feeding How should I use this medicine? Take this medicine by mouth with a glass of water. Follow the directions on the prescription label. Do not cut, crush or chew this medicine. You can take this medicine with or without food. Take your medicine at regular intervals. Do not take your medicine more often than directed. Do not stop taking this medicine suddenly except upon the advice of your doctor. Stopping this medicine too quickly may cause serious side effects or your condition may worsen. A special MedGuide will be given to you by the pharmacist with each prescription and refill. Be sure to read this information carefully each time. Talk to your pediatrician regarding the use of this medicine in children. While this drug may be prescribed for children as young as 7 years of age for selected conditions, precautions do apply. Overdosage: If you think you have taken too much of this medicine contact a poison control center or emergency room at once. NOTE: This medicine is only for you. Do not share this medicine with others. What if I miss a dose? If you miss a dose, take it as soon as you  can. If it is almost time for your next dose, take only that dose. Do not take double or extra doses. What may interact with this medicine? Do not take this medicine with any of the following medications: -desvenlafaxine -levomilnacipran -linezolid -MAOIs like Carbex, Eldepryl, Marplan, Nardil, and Parnate -methylene blue (injected into a vein) -milnacipran -thioridazine -venlafaxine This medicine may also interact with the following medications: -alcohol -amphetamines -aspirin and aspirin-like medicines -certain antibiotics like ciprofloxacin and enoxacin -certain medicines for blood pressure, heart disease, irregular heart beat -certain medicines for depression, anxiety, or psychotic disturbances -certain medicines for migraine headache like almotriptan, eletriptan, frovatriptan, naratriptan, rizatriptan, sumatriptan, zolmitriptan -certain medicines that treat or prevent blood clots like warfarin, enoxaparin, and dalteparin -cimetidine -fentanyl -lithium -NSAIDS, medicines for pain and inflammation, like ibuprofen or naproxen -phentermine -procarbazine -rasagiline -sibutramine -St. John's wort -theophylline -tramadol -tryptophan This list may not describe all possible interactions. Give your health care provider a list of all the medicines, herbs, non-prescription drugs, or dietary supplements you use. Also tell them if you smoke, drink alcohol, or use illegal drugs. Some items may interact with your medicine. What should I watch for while using this medicine? Tell your doctor if your symptoms do not get better or if they get worse. Visit your doctor or health care professional for regular checks on your progress. Because it may take several weeks to see the full effects of this medicine, it is important to continue your treatment as prescribed by your doctor. Patients and their families should watch out for new or worsening thoughts of suicide or depression. Also watch out for  sudden changes in   feelings such as feeling anxious, agitated, panicky, irritable, hostile, aggressive, impulsive, severely restless, overly excited and hyperactive, or not being able to sleep. If this happens, especially at the beginning of treatment or after a change in dose, call your health care professional. Dennis Bast may get drowsy or dizzy. Do not drive, use machinery, or do anything that needs mental alertness until you know how this medicine affects you. Do not stand or sit up quickly, especially if you are an older patient. This reduces the risk of dizzy or fainting spells. Alcohol may interfere with the effect of this medicine. Avoid alcoholic drinks. This medicine can cause an increase in blood pressure. This medicine can also cause a sudden drop in your blood pressure, which may make you feel faint and increase the chance of a fall. These effects are most common when you first start the medicine or when the dose is increased, or during use of other medicines that can cause a sudden drop in blood pressure. Check with your doctor for instructions on monitoring your blood pressure while taking this medicine. Your mouth may get dry. Chewing sugarless gum or sucking hard candy, and drinking plenty of water may help. Contact your doctor if the problem does not go away or is severe. What side effects may I notice from receiving this medicine? Side effects that you should report to your doctor or health care professional as soon as possible: -allergic reactions like skin rash, itching or hives, swelling of the face, lips, or tongue -anxious -breathing problems -confusion -changes in vision -chest pain -confusion -elevated mood, decreased need for sleep, racing thoughts, impulsive behavior -eye pain -fast, irregular heartbeat -feeling faint or lightheaded, falls -feeling agitated, angry, or irritable -hallucination, loss of contact with reality -high blood pressure -loss of balance or  coordination -palpitations -redness, blistering, peeling or loosening of the skin, including inside the mouth -restlessness, pacing, inability to keep still -seizures -stiff muscles -suicidal thoughts or other mood changes -trouble passing urine or change in the amount of urine -trouble sleeping -unusual bleeding or bruising -unusually weak or tired -vomiting -yellowing of the eyes or skin Side effects that usually do not require medical attention (report to your doctor or health care professional if they continue or are bothersome): -change in sex drive or performance -change in appetite or weight -constipation -dizziness -dry mouth -headache -increased sweating -nausea -tired This list may not describe all possible side effects. Call your doctor for medical advice about side effects. You may report side effects to FDA at 1-800-FDA-1088. Where should I keep my medicine? Keep out of the reach of children. Store at room temperature between 20 and 25 degrees C (68 to 77 degrees F). Throw away any unused medicine after the expiration date. NOTE: This sheet is a summary. It may not cover all possible information. If you have questions about this medicine, talk to your doctor, pharmacist, or health care provider.  2018 Elsevier/Gold Standard (2015-09-13 18:16:03)  Paresthesia Paresthesia is an abnormal burning or prickling sensation. This sensation is generally felt in the hands, arms, legs, or feet. However, it may occur in any part of the body. Usually, it is not painful. The feeling may be described as:  Tingling or numbness.  Pins and needles.  Skin crawling.  Buzzing.  Limbs falling asleep.  Itching.  Most people experience temporary (transient) paresthesia at some time in their lives. Paresthesia may occur when you breathe too quickly (hyperventilation). It can also occur without any apparent cause. Commonly, paresthesia  occurs when pressure is placed on a nerve. The  sensation quickly goes away after the pressure is removed. For some people, however, paresthesia is a long-lasting (chronic) condition that is caused by an underlying disorder. If you continue to have paresthesia, you may need further medical evaluation. Follow these instructions at home: Watch your condition for any changes. Taking the following actions may help to lessen any discomfort that you are feeling:  Avoid drinking alcohol.  Try acupuncture or massage to help relieve your symptoms.  Keep all follow-up visits as directed by your health care provider. This is important.  Contact a health care provider if:  You continue to have episodes of paresthesia.  Your burning or prickling feeling gets worse when you walk.  You have pain, cramps, or dizziness.  You develop a rash. Get help right away if:  You feel weak.  You have trouble walking or moving.  You have problems with speech, understanding, or vision.  You feel confused.  You cannot control your bladder or bowel movements.  You have numbness after an injury.  You faint. This information is not intended to replace advice given to you by your health care provider. Make sure you discuss any questions you have with your health care provider. Document Released: 04/04/2002 Document Revised: 09/20/2015 Document Reviewed: 04/10/2014 Elsevier Interactive Patient Education  [employer].   Please continue all medications as directed. Continue to reduce-stop tobacco use. CT of head without contrast ordered, discussed Red Flag symptoms.  Please make lab appt to re-check cholesterol panel, if LDL remains above goal will again discuss statin therapy. NICE TO SEE YOU!

## 2017-03-09 NOTE — Progress Notes (Addendum)
Subjective:    Patient ID: Heidi Foster, female    DOB: [birth year], 61 y.o.   MRN: [id number] 01/22/2017 OV Notes: HPI:  Heidi Foster is here for CPE: PMH: HTN, GERD, Dysphagia, HL, tobacco use disorder,and  Fatigue.  10/08/16:Transnasal Laryngoscopy, impression: Bilateral vocal cord polyps.    11/13/16:She has MICROLARYNGOSCOPY WITH LASER.  01/12/17: Pulmonology visit, records reviewed and sig points: Centrilobular emphysema, started on Trelegy in place of Anoro.  Smoking cessation discussed and she is self-reducing tobacco use. LD CHEST CT W/O 08/01/16 (per radiologist):  Lung-RADS Category 1, negative. Repeat CT in 1 year. Borderline ascending aortic aneurysm. Mild centrilobular emphysema.  Instructed to f/u in 3 months.  01/14/17: GYN visit: PAP completed.  Fibrocystic Breasts L>R, she has f/u L dx mammogram on 02/04/17 F/u with GYN 02/04/17  She is down to < half/pack cigarettes/day-GREAT. She continues to have dyspnea and reports that she can only ambulate 200 yards without needing to stop/rest. She also reports increase in lumbar back pain and bil lower extremity pain and tingling. She cannot exercise or work due to "my breathing and pain and I am waiting on my disability claim".   She denies anxiety/depression or thoughts of harming herself/others. She continues to stressed that she is completely financially dependent on her son and daughter-in-law.  Healthcare Maintenance: PAP-completed last week Mammogram- completed in Spring and f/u imaging scheduled 02/04/17 CT- completed 07/2016, repeat in 1 year Colonoscopy- has never had one and order has been placed.  03/09/2017 Notes: Heidi Foster is here for f/u: treatment of Fibromyalgia with Duloxetine 30mg  BID.  She reports reduction of overall pain from 10/10 to 7/10.  She continues to walk as often as possible and even stretches daily.  She drinks 3-4 16 oz bottles of water and is still trying to reduce tobacco use, smoking 1/2 pack /day.  Last  Thursday 03/05/17, she experienced bil tingling in hands/fingers, and then briefly had change in vision described as "small areas of black spots" and mild dizziness that all self resolved within an hr.  She continues to have intermittent L hand/fingers numbness/tingling-none at present. Yesterday morning, while making her bed she "felt woozie and all of a sudden very tired" that resolved by mid-morning.  She had TIA/MI in early 2000s and has never ben seen by Neurologist. BP remains stable and she does not have Hx of Diabetes. She denies any acute neurological sx's during OV. Patient Care Team    Relationship Specialty Notifications Start End  Heidi Grandchild, NP PCP - General Family Medicine  05/26/16     Patient Active Problem List   Diagnosis Date Noted  . Paresthesia of both hands 03/09/2017  . Hx of transient ischemic attack (TIA) 03/09/2017  . Fibromyalgia 01/22/2017  . Breast pain, left 12/15/2016  . Generalized weakness 12/15/2016  . Leg pain, bilateral 12/15/2016  . 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 Centrilobular emphysema ([place]) 09/09/2016  . GERD (gastroesophageal reflux disease) 06/30/2016  . Dysphagia 06/30/2016  . Health care maintenance 06/25/2016  . Other fatigue 05/26/2016  . Hypertension 05/26/2016  . Other hyperlipidemia 05/26/2016  . Family history of diabetes mellitus in mother 05/26/2016  . Acute pain of right shoulder 05/26/2016  . Tobacco use disorder 05/26/2016  . Dyspnea on exertion 05/26/2016  . Chronic pain of right knee 05/26/2016  . History of MI (myocardial infarction) 05/26/2016  . History of TIA (transient ischemic attack) 05/26/2016  . Numbness and tingling of left lower extremity 05/26/2016  . Numbness  and tingling in left hand 05/26/2016  . 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 Allergy   . Anginal pain ([place])   . Arthritis   . 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 Closed fracture of right patella   . Emphysema  of lung ([place])   . GERD (gastroesophageal reflux disease)   . Heart attack ([place]) 1999  . Hypertension   . Osteopenia 12/2016   T score -1.2 FRAX 7.4%/0.8%  . Seborrheic keratoses   . Vocal cord polyp      Past Surgical History:  Procedure Laterality Date  . BREAST BIOPSY    . BREAST SURGERY     Left Breast nodule markers  . CARDIAC CATHETERIZATION     1999  . caridac cath    . lt. ovary removed    . polyp removal  10/2016   throat -   . SKIN BIOPSY     nose  . TUBAL LIGATION       Family History  Problem Relation Age of Onset  . Diabetes Mother   . Hypertension Mother   . Stroke Mother   . Alcohol abuse Father   . Multiple sclerosis Sister   . Cancer Brother        lung  . Stroke Maternal Grandmother   . Heart attack Maternal Grandmother   . Hypertension Maternal Grandmother   . Colon cancer Neg Hx   . Colon polyps Neg Hx   . Esophageal cancer Neg Hx   . Stomach cancer Neg Hx   . Rectal cancer Neg Hx      Social History   Substance and Sexual Activity  Drug Use No     Social History   Substance and Sexual Activity  Alcohol Use No     Social History   Tobacco Use  Smoking Status Current Every Day Smoker  . Packs/day: 0.50  . Years: 40.00  . Pack years: 20.00  . Types: Cigarettes  . Start date: 09/27/1976  Smokeless Tobacco Never Used  Tobacco Comment   Peak rate of 1ppd     Outpatient Encounter Medications as of 03/09/2017  Medication Sig  . albuterol (PROVENTIL HFA;VENTOLIN HFA) 108 (90 Base) MCG/ACT inhaler Inhale 2 puffs into the lungs every 4 (four) hours as needed for wheezing or shortness of breath.  Marland Kitchen amLODipine (NORVASC) 2.5 MG tablet Take 1 tablet (2.5 mg total) by mouth daily. Stop taking Amlodipine 10mg .  . ANORO ELLIPTA 62.5-25 MCG/INH AEPB Inhale 1 puff into the lungs daily.  Marland Kitchen aspirin EC 81 MG tablet Take 81 mg by mouth daily.  Marland Kitchen aspirin-acetaminophen-caffeine (EXCEDRIN MIGRAINE) 250-250-65 MG tablet Take 1 tablet by mouth  daily as needed for headache.  . DULoxetine (CYMBALTA) 30 MG capsule 1 tablet daily for first week, then increase to two tabs once daily.  Marland Kitchen losartan (COZAAR) 100 MG tablet TAKE ONE TABLET BY MOUTH ONCE DAILY  . Multiple Vitamin (MULTIVITAMIN) tablet Take 1 tablet by mouth daily.  Marland Kitchen Spacer/Aero-Holding Chambers (AEROCHAMBER MV) inhaler Use as instructed  . verapamil (VERELAN PM) 180 MG 24 hr capsule TAKE ONE TABLET BY MOUTH ONCE DAILY  . [DISCONTINUED] DULoxetine (CYMBALTA) 30 MG capsule 1 tablet daily for first week, then increase to two tabs once daily.   No facility-administered encounter medications on file as of 03/09/2017.     Allergies: Codeine and Bupropion  Body mass index is 26.88 kg/m.  Blood pressure 136/68,  pulse 78, height 5' 0.75" (1.543 m), weight 141 lb 1.9 oz (64 kg), SpO2 97 %.   Review of Systems  Constitutional: Positive for fatigue. Negative for activity change, appetite change, chills, diaphoresis and fever.  HENT: Positive for voice change. Negative for congestion, postnasal drip and trouble swallowing.   Eyes: Negative for visual disturbance.  Respiratory: Positive for cough and shortness of breath. Negative for chest tightness, wheezing and stridor.   Cardiovascular: Negative for chest pain, palpitations and leg swelling.  Gastrointestinal: Negative for abdominal distention, abdominal pain, blood in stool, constipation, nausea and vomiting.  Endocrine: Negative for cold intolerance, heat intolerance, polydipsia, polyphagia and polyuria.  Genitourinary: Negative for flank pain and hematuria.  Musculoskeletal: Positive for arthralgias, back pain, gait problem, joint swelling, myalgias, neck pain and neck stiffness.  Skin: Negative for color change, pallor, rash and wound.  Neurological: Negative for dizziness, weakness and headaches.       Intermittent L hand/finger numbness/tingling since 03/05/17-none at present  Hematological: Does not bruise/bleed easily.   Psychiatric/Behavioral: Positive for sleep disturbance.       4 pillow orthopnea       Objective:   Physical Exam  Constitutional: She is oriented to person, place, and time. She appears well-developed and well-nourished. No distress.  HENT:  Head: Normocephalic and atraumatic.  Right Ear: Tympanic membrane and ear canal normal. Decreased hearing is noted.  Left Ear: Tympanic membrane and ear canal normal. Decreased hearing is noted.  Mouth/Throat: Mucous membranes are normal. Abnormal dentition. Dental caries present.  Eyes: Conjunctivae are normal. Pupils are equal, round, and reactive to light.  Neck: Normal range of motion.  Cardiovascular: Normal rate, normal heart sounds and intact distal pulses.  No murmur heard. Pulmonary/Chest: Effort normal. No respiratory distress. She has decreased breath sounds in the right lower field and the left lower field. She has no wheezes. She has no rhonchi. She has no rales. She exhibits no tenderness. Right breast exhibits no inverted nipple, no mass, no nipple discharge and no skin change. Left breast exhibits no inverted nipple, no nipple discharge and no skin change.  Abdominal: Soft. Bowel sounds are normal. She exhibits no distension and no mass. There is no tenderness. There is no rebound and no guarding.  Musculoskeletal: She exhibits tenderness. She exhibits no deformity.       Right hip: She exhibits tenderness.       Left hip: She exhibits tenderness.       Right knee: Tenderness found.       Left knee: Tenderness found.       Right ankle: Tenderness.       Left ankle: Tenderness.       Thoracic back: She exhibits tenderness.       Lumbar back: She exhibits tenderness.  Lymphadenopathy:    She has no cervical adenopathy.  Neurological: She is alert and oriented to person, place, and time. No cranial nerve deficit. Coordination normal.  Skin: Skin is warm and dry. No rash noted. She is not diaphoretic. No erythema. No pallor.   Psychiatric: She has a normal mood and affect. Her behavior is normal. Judgment and thought content normal.  Nursing note and vitals reviewed.         Assessment & Plan:   1. Other hyperlipidemia   2. Paresthesia of both hands   3. Hx of transient ischemic attack (TIA)   4. Fibromyalgia   5. Essential hypertension   6. Tobacco use disorder   7. History  of TIA (transient ischemic attack)     Fibromyalgia Continue Duloxetine 30mg  BID Continue daily walking   Hypertension BP at goal 136/68, HR 78 Continue Verapamil 180mg , Losartan 100mg , and Amlodipine 2.5mg  Denies CP/palptiations.   Tobacco use disorder Declined tobacco cessation today. Encouraged to continue to reduce-stop tobacco use.  Paresthesia of both hands CT head WO imaging was 03/2015-please see Epic. Initially ordered CT WO, order changed to MRI head due to visual change reported during "episode" last week.  History of TIA (transient ischemic attack) CT head WO imaging was 03/2015-please see Epic. Initially ordered CT WO, order changed to MRI head due to visual change reported during "episode" last week.  Other hyperlipidemia Last lipid panel- 04/2016 Tot 179 TGs 99 HDL 48 LDL 111 She declined statin therapy and has been using TLC Will re-check lipid panel and if ASCVD risk warrants statin therapy, will again discuss.    FOLLOW-UP:  Return if symptoms worsen or fail to improve.

## 2017-03-09 NOTE — Assessment & Plan Note (Addendum)
CT head WO imaging was 03/2015-please see Epic. Initially ordered CT WO, order changed to MRI head due to visual change reported during "episode" last week.

## 2017-03-09 NOTE — Assessment & Plan Note (Signed)
Continue Duloxetine 30mg  BID Continue daily walking

## 2017-03-09 NOTE — Assessment & Plan Note (Signed)
Last lipid panel- 04/2016 Tot 179 TGs 99 HDL 48 LDL 111 She declined statin therapy and has been using TLC Will re-check lipid panel and if ASCVD risk warrants statin therapy, will again discuss.

## 2017-03-09 NOTE — Assessment & Plan Note (Addendum)
BP at goal 136/68, HR 78 Continue Verapamil 180mg , Losartan 100mg , and Amlodipine 2.5mg  Denies CP/palptiations.

## 2017-03-10 ENCOUNTER — Other Ambulatory Visit: Payer: Self-pay

## 2017-03-10 ENCOUNTER — Telehealth: Payer: Self-pay | Admitting: Adult Health

## 2017-03-10 DIAGNOSIS — Z8673 Personal history of transient ischemic attack (TIA), and cerebral infarction without residual deficits: Secondary | ICD-10-CM

## 2017-03-10 DIAGNOSIS — R202 Paresthesia of skin: Secondary | ICD-10-CM

## 2017-03-10 NOTE — Telephone Encounter (Signed)
Received call from [facility] rep/ Dorian Pod needing to speak to who ever does the Pre-cert for patient-- forwarding message to medical assistant to call --[phone].  --glh

## 2017-03-10 NOTE — Telephone Encounter (Signed)
Dorian Pod @ [facility] states that MRA was ordered but prior authorization was for an MRI.  Mina Marble, NP notified and ordered corrected.  Charyl Bigger, CMA

## 2017-03-10 NOTE — Addendum Note (Signed)
Addended by: Mina Marble D on: 03/10/2017 08:40 AM   Modules accepted: Orders

## 2017-03-11 ENCOUNTER — Telehealth: Payer: Self-pay | Admitting: Adult Health

## 2017-03-11 ENCOUNTER — Other Ambulatory Visit: Payer: Self-pay | Admitting: Adult Health

## 2017-03-11 ENCOUNTER — Other Ambulatory Visit: Payer: BLUE CROSS/BLUE SHIELD

## 2017-03-11 MED ORDER — ALPRAZOLAM 0.5 MG PO TABS: 0.5000 mg | ORAL_TABLET | Freq: Two times a day (BID) | ORAL | 0 refills | Status: DC | PRN

## 2017-03-11 NOTE — Telephone Encounter (Signed)
Patient called states did not realize MRI was inclosed & she is claustrophobic--Pt is requesting Rx to calm her down while in machine only-- Please call Rx into [facility] on [address] for pick up before Sunday's MRI.  --glh

## 2017-03-11 NOTE — Telephone Encounter (Signed)
LVM advising pt of RX sent to pharmacy.  Charyl Bigger, CMA

## 2017-03-11 NOTE — Telephone Encounter (Signed)
Good Afternoon Heidi Foster, Thank you for verifying [place] Controlled Substance Database and Heidi Foster has not had any controlled substance filled in years. Alprazolam rx sent in, please call and let the pt. Know. Thanks! Valetta Fuller

## 2017-03-11 NOTE — Progress Notes (Signed)
[place] Controlled Substance Database reviewed-no contraindications for Alprazolam rx for anxiety for upcoming MRI

## 2017-03-15 ENCOUNTER — Encounter (HOSPITAL_COMMUNITY): Payer: Self-pay

## 2017-03-15 ENCOUNTER — Other Ambulatory Visit: Payer: BLUE CROSS/BLUE SHIELD

## 2017-03-15 ENCOUNTER — Ambulatory Visit
Admission: RE | Admit: 2017-03-15 | Discharge: 2017-03-15 | Disposition: A | Discharge status: Home / Self Care | Payer: BLUE CROSS/BLUE SHIELD | Source: Ambulatory Visit | Attending: Adult Health | Admitting: Adult Health

## 2017-03-15 ENCOUNTER — Other Ambulatory Visit: Payer: Self-pay

## 2017-03-15 ENCOUNTER — Observation Stay (HOSPITAL_COMMUNITY): Payer: BLUE CROSS/BLUE SHIELD

## 2017-03-15 ENCOUNTER — Inpatient Hospital Stay (HOSPITAL_COMMUNITY)
Admission: EM | Admit: 2017-03-15 | Discharge: 2017-03-17 | DRG: 041 | Disposition: A | Discharge status: Home Health | Payer: BLUE CROSS/BLUE SHIELD | Attending: Internal Medicine | Admitting: Internal Medicine

## 2017-03-15 DIAGNOSIS — Z90721 Acquired absence of ovaries, unilateral: Secondary | ICD-10-CM

## 2017-03-15 DIAGNOSIS — I63411 Cerebral infarction due to embolism of right middle cerebral artery: Secondary | ICD-10-CM | POA: Diagnosis not present

## 2017-03-15 DIAGNOSIS — K219 Gastro-esophageal reflux disease without esophagitis: Secondary | ICD-10-CM | POA: Diagnosis not present

## 2017-03-15 DIAGNOSIS — Z833 Family history of diabetes mellitus: Secondary | ICD-10-CM

## 2017-03-15 DIAGNOSIS — E785 Hyperlipidemia, unspecified: Secondary | ICD-10-CM | POA: Diagnosis present

## 2017-03-15 DIAGNOSIS — Z820 Family history of epilepsy and other diseases of the nervous system: Secondary | ICD-10-CM

## 2017-03-15 DIAGNOSIS — J449 Chronic obstructive pulmonary disease, unspecified: Secondary | ICD-10-CM | POA: Diagnosis present

## 2017-03-15 DIAGNOSIS — Z8249 Family history of ischemic heart disease and other diseases of the circulatory system: Secondary | ICD-10-CM

## 2017-03-15 DIAGNOSIS — F17210 Nicotine dependence, cigarettes, uncomplicated: Secondary | ICD-10-CM | POA: Diagnosis present

## 2017-03-15 DIAGNOSIS — I252 Old myocardial infarction: Secondary | ICD-10-CM

## 2017-03-15 DIAGNOSIS — Z823 Family history of stroke: Secondary | ICD-10-CM

## 2017-03-15 DIAGNOSIS — I63511 Cerebral infarction due to unspecified occlusion or stenosis of right middle cerebral artery: Secondary | ICD-10-CM | POA: Diagnosis not present

## 2017-03-15 DIAGNOSIS — R202 Paresthesia of skin: Secondary | ICD-10-CM

## 2017-03-15 DIAGNOSIS — H53129 Transient visual loss, unspecified eye: Secondary | ICD-10-CM | POA: Diagnosis present

## 2017-03-15 DIAGNOSIS — H538 Other visual disturbances: Secondary | ICD-10-CM | POA: Diagnosis not present

## 2017-03-15 DIAGNOSIS — I639 Cerebral infarction, unspecified: Secondary | ICD-10-CM | POA: Diagnosis present

## 2017-03-15 DIAGNOSIS — E876 Hypokalemia: Secondary | ICD-10-CM | POA: Diagnosis present

## 2017-03-15 DIAGNOSIS — F17200 Nicotine dependence, unspecified, uncomplicated: Secondary | ICD-10-CM

## 2017-03-15 DIAGNOSIS — I10 Essential (primary) hypertension: Secondary | ICD-10-CM | POA: Diagnosis present

## 2017-03-15 DIAGNOSIS — M797 Fibromyalgia: Secondary | ICD-10-CM | POA: Diagnosis not present

## 2017-03-15 DIAGNOSIS — Z7982 Long term (current) use of aspirin: Secondary | ICD-10-CM

## 2017-03-15 DIAGNOSIS — Z8673 Personal history of transient ischemic attack (TIA), and cerebral infarction without residual deficits: Secondary | ICD-10-CM

## 2017-03-15 DIAGNOSIS — I671 Cerebral aneurysm, nonruptured: Secondary | ICD-10-CM

## 2017-03-15 DIAGNOSIS — J432 Centrilobular emphysema: Secondary | ICD-10-CM | POA: Diagnosis present

## 2017-03-15 DIAGNOSIS — R29700 NIHSS score 0: Secondary | ICD-10-CM | POA: Diagnosis present

## 2017-03-15 HISTORY — DX: Cerebral infarction, unspecified: I63.9

## 2017-03-15 LAB — DIFFERENTIAL
Basophils Absolute: 0 10*3/uL (ref 0.0–0.1)
Basophils Relative: 0 %
Eosinophils Absolute: 0.1 10*3/uL (ref 0.0–0.7)
Eosinophils Relative: 2 %
LYMPHS PCT: 30 %
Lymphs Abs: 2.7 10*3/uL (ref 0.7–4.0)
MONO ABS: 0.7 10*3/uL (ref 0.1–1.0)
Monocytes Relative: 8 %
NEUTROS ABS: 5.3 10*3/uL (ref 1.7–7.7)
Neutrophils Relative %: 60 %

## 2017-03-15 LAB — I-STAT CHEM 8, ED
BUN: 11 mg/dL (ref 6–20)
CHLORIDE: 106 mmol/L (ref 101–111)
CREATININE: 0.7 mg/dL (ref 0.44–1.00)
Calcium, Ion: 1.2 mmol/L (ref 1.15–1.40)
Glucose, Bld: 113 mg/dL — ABNORMAL HIGH (ref 65–99)
HCT: 44 % (ref 36.0–46.0)
Hemoglobin: 15 g/dL (ref 12.0–15.0)
POTASSIUM: 3.3 mmol/L — AB (ref 3.5–5.1)
Sodium: 141 mmol/L (ref 135–145)
TCO2: 26 mmol/L (ref 22–32)

## 2017-03-15 LAB — APTT: aPTT: 32 seconds (ref 24–36)

## 2017-03-15 LAB — COMPREHENSIVE METABOLIC PANEL
ALK PHOS: 107 U/L (ref 38–126)
ALT: 15 U/L (ref 14–54)
AST: 22 U/L (ref 15–41)
Albumin: 3.5 g/dL (ref 3.5–5.0)
Anion gap: 6 (ref 5–15)
BUN: 9 mg/dL (ref 6–20)
CALCIUM: 9.3 mg/dL (ref 8.9–10.3)
CO2: 25 mmol/L (ref 22–32)
CREATININE: 0.66 mg/dL (ref 0.44–1.00)
Chloride: 109 mmol/L (ref 101–111)
GFR calc non Af Amer: >60 mL/min (ref >60)
GLUCOSE: 103 mg/dL — AB (ref 65–99)
Potassium: 3.4 mmol/L — ABNORMAL LOW (ref 3.5–5.1)
SODIUM: 140 mmol/L (ref 135–145)
Total Bilirubin: 0.2 mg/dL — ABNORMAL LOW (ref 0.3–1.2)
Total Protein: 6.1 g/dL — ABNORMAL LOW (ref 6.5–8.1)

## 2017-03-15 LAB — PROTIME-INR
INR: 0.97
PROTHROMBIN TIME: 12.8 s (ref 11.4–15.2)

## 2017-03-15 LAB — CBC
HEMATOCRIT: 43.9 % (ref 36.0–46.0)
Hemoglobin: 14.6 g/dL (ref 12.0–15.0)
MCH: 30.7 pg (ref 26.0–34.0)
MCHC: 33.3 g/dL (ref 30.0–36.0)
MCV: 92.2 fL (ref 78.0–100.0)
PLATELETS: 262 10*3/uL (ref 150–400)
RBC: 4.76 MIL/uL (ref 3.87–5.11)
RDW: 14 % (ref 11.5–15.5)
WBC: 8.8 10*3/uL (ref 4.0–10.5)

## 2017-03-15 LAB — I-STAT TROPONIN, ED: Troponin i, poc: 0 ng/mL (ref 0.00–0.08)

## 2017-03-15 LAB — CBG MONITORING, ED: Glucose-Capillary: 87 mg/dL (ref 65–99)

## 2017-03-15 MED ORDER — ACETAMINOPHEN 325 MG PO TABS: 650.0000 mg | ORAL_TABLET | ORAL | Status: DC | PRN

## 2017-03-15 MED ORDER — LOSARTAN POTASSIUM 50 MG PO TABS
100.0000 mg | ORAL_TABLET | Freq: Every day | ORAL | Status: DC
  Administered 2017-03-16 – 2017-03-17 (×2): 100 mg via ORAL
  Filled 2017-03-15 (×2): qty 2

## 2017-03-15 MED ORDER — ASPIRIN 300 MG RE SUPP: 300.0000 mg | Freq: Every day | RECTAL | Status: DC

## 2017-03-15 MED ORDER — ADULT MULTIVITAMIN W/MINERALS CH
1.0000 | ORAL_TABLET | Freq: Every day | ORAL | Status: DC
  Administered 2017-03-16 – 2017-03-17 (×2): 1 via ORAL
  Filled 2017-03-15 (×2): qty 1

## 2017-03-15 MED ORDER — ASPIRIN 325 MG PO TABS
325.0000 mg | ORAL_TABLET | Freq: Every day | ORAL | Status: DC
  Administered 2017-03-16: 325 mg via ORAL
  Filled 2017-03-15: qty 1

## 2017-03-15 MED ORDER — ACETAMINOPHEN 650 MG RE SUPP: 650.0000 mg | RECTAL | Status: DC | PRN

## 2017-03-15 MED ORDER — STROKE: EARLY STAGES OF RECOVERY BOOK
Freq: Once | Status: AC
  Administered 2017-03-16: 22:00:00

## 2017-03-15 MED ORDER — ASPIRIN 81 MG PO CHEW
324.0000 mg | CHEWABLE_TABLET | Freq: Once | ORAL | Status: AC
Start: 2017-03-15 — End: 2017-03-15
  Administered 2017-03-15: 324 mg via ORAL
  Filled 2017-03-15: qty 4

## 2017-03-15 MED ORDER — DULOXETINE HCL 30 MG PO CPEP
30.0000 mg | ORAL_CAPSULE | Freq: Two times a day (BID) | ORAL | Status: DC
  Administered 2017-03-15 – 2017-03-17 (×4): 30 mg via ORAL
  Filled 2017-03-15 (×4): qty 1

## 2017-03-15 MED ORDER — IOPAMIDOL (ISOVUE-370) INJECTION 76%
INTRAVENOUS | Status: AC
  Administered 2017-03-15: 50 mL
  Filled 2017-03-15: qty 50

## 2017-03-15 MED ORDER — UMECLIDINIUM-VILANTEROL 62.5-25 MCG/INH IN AEPB
1.0000 | INHALATION_SPRAY | Freq: Every day | RESPIRATORY_TRACT | Status: DC
  Administered 2017-03-16: 1 via RESPIRATORY_TRACT
  Filled 2017-03-15: qty 14

## 2017-03-15 MED ORDER — NICOTINE 21 MG/24HR TD PT24
21.0000 mg | MEDICATED_PATCH | Freq: Once | TRANSDERMAL | Status: AC
  Administered 2017-03-15: 21 mg via TRANSDERMAL
  Filled 2017-03-15: qty 1

## 2017-03-15 MED ORDER — ACETAMINOPHEN 160 MG/5ML PO SOLN: 650.0000 mg | ORAL | Status: DC | PRN

## 2017-03-15 MED ORDER — AMLODIPINE BESYLATE 2.5 MG PO TABS: 2.5000 mg | ORAL_TABLET | Freq: Every day | ORAL | Status: DC

## 2017-03-15 MED ORDER — ALBUTEROL SULFATE (2.5 MG/3ML) 0.083% IN NEBU
2.5000 mg | INHALATION_SOLUTION | RESPIRATORY_TRACT | Status: DC | PRN
Start: 2017-03-15 — End: 2017-03-17

## 2017-03-15 NOTE — Progress Notes (Signed)
  Pt admitted to the unit. Pt is stable, alert and oriented per baseline. Oriented to room, staff, and call bell. Educated to call for any assistance. Bed in lowest position, call bell within reach- will continue to monitor. 

## 2017-03-15 NOTE — Consult Note (Signed)
Neurology Consultation  Reason for Consult: Stroke Referring Physician: Dr. Thomasene Lot  CC: Tingling in bilateral hands, floaters in vision  History is obtained from: Patient  HPI: Heidi Foster is a 61 y.o. female who has a past medical history of hypertension, COPD, tobacco abuse, history of TIAs in the past, history of an MI status post cardiac catheterization with no stents, fibromyalgia, who was in her usual state of health until 03/05/2017 when she noted that her hands felt tingly-pins and needles.  When she patted her cat, she could not feel the fur of the cat on her hands.  She also could not feel her fingers on her face when she removed her from the face.  The symptoms resolved on their own in less than a day.  Then on 03/10/2017, she reported seeing floaters in her vision, which also resolved in less than a day.  She went to her doctor who had her go for a outpatient MRI, which was done and revealed a right MCA territory infarct.  She was then sent to the emergency room for stroke evaluation and stroke workup. She does not have any symptoms at this point. She denies any weight gain or weight loss.  Denies any fevers chills.  She denies any nausea vomiting.  Denies any visual symptoms right now.  Denies any chest pain.  Denies palpitations.  Denies acute shortness of breath.  LKW: 03/05/2017 tpa given?: no, outside the window Premorbid modified Rankin scale (mRS): 0  ROS: A 14 point ROS was performed and is negative except as noted in the HPI.  Past Medical History:  Diagnosis Date  . Allergy   . Anginal pain ([place])   . Arthritis   . Cancer ([place])    skin on nose  . Closed fracture of right patella   . Emphysema of lung ([place])   . GERD (gastroesophageal reflux disease)   . Heart attack ([place]) 1999  . Hypertension   . Osteopenia 12/2016   T score -1.2 FRAX 7.4%/0.8%  . Seborrheic keratoses   . Stroke ([place])    TIA x 2, early 2000's   . Vocal cord polyp     Family History  Problem  Relation Age of Onset  . Diabetes Mother   . Hypertension Mother   . Stroke Mother   . Alcohol abuse Father   . Multiple sclerosis Sister   . Cancer Brother        lung  . Stroke Maternal Grandmother   . Heart attack Maternal Grandmother   . Hypertension Maternal Grandmother   . Colon cancer Neg Hx   . Colon polyps Neg Hx   . Esophageal cancer Neg Hx   . Stomach cancer Neg Hx   . Rectal cancer Neg Hx     Social History:   reports that she has been smoking cigarettes.  She started smoking about 40 years ago. She has a 20.00 pack-year smoking history. she has never used smokeless tobacco. She reports that she does not drink alcohol or use drugs.  Medications  Current Facility-Administered Medications:  .  nicotine (NICODERM CQ - dosed in mg/24 hours) patch 21 mg, 21 mg, Transdermal, Once, Mackuen, Courteney Lyn, MD, 21 mg at 03/15/17 2041  Current Outpatient Medications:  .  albuterol (PROVENTIL HFA;VENTOLIN HFA) 108 (90 Base) MCG/ACT inhaler, Inhale 2 puffs into the lungs every 4 (four) hours as needed for wheezing or shortness of breath., Disp: 1 Inhaler, Rfl: 3 .  amLODipine (NORVASC) 2.5 MG tablet, Take  1 tablet (2.5 mg total) by mouth daily. Stop taking Amlodipine 10mg ., Disp: 90 tablet, Rfl: 2 .  aspirin EC 81 MG tablet, Take 81 mg by mouth daily., Disp: , Rfl:  .  aspirin-acetaminophen-caffeine (EXCEDRIN MIGRAINE) 250-250-65 MG tablet, Take 1 tablet by mouth daily as needed for headache., Disp: , Rfl:  .  DULoxetine (CYMBALTA) 30 MG capsule, 1 tablet daily for first week, then increase to two tabs once daily. (Patient taking differently: Take 30 mg 2 (two) times daily by mouth. ), Disp: 180 capsule, Rfl: 2 .  losartan (COZAAR) 100 MG tablet, TAKE ONE TABLET BY MOUTH ONCE DAILY (Patient taking differently: TAKE ONE TABLET (100 MG)  BY MOUTH ONCE DAILY), Disp: 90 tablet, Rfl: 1 .  Multiple Vitamin (MULTIVITAMIN WITH MINERALS) TABS tablet, Take 1 tablet daily by mouth., Disp: ,  Rfl:  .  umeclidinium-vilanterol (ANORO ELLIPTA) 62.5-25 MCG/INH AEPB, Inhale 1 puff daily into the lungs., Disp: , Rfl:  .  verapamil (VERELAN PM) 180 MG 24 hr capsule, TAKE ONE TABLET BY MOUTH ONCE DAILY (Patient taking differently: TAKE ONE TABLET (180 MG) BY MOUTH ONCE DAILY), Disp: 90 capsule, Rfl: 1 .  ALPRAZolam (XANAX) 0.5 MG tablet, Take 1 tablet (0.5 mg total) 2 (two) times daily as needed by mouth for anxiety. (Patient not taking: Reported on 03/15/2017), Disp: 8 tablet, Rfl: 0 .  Spacer/Aero-Holding Chambers (AEROCHAMBER MV) inhaler, Use as instructed, Disp: 1 each, Rfl: 0  Exam: Current vital signs: BP (!) 151/82   Pulse 66   Temp 98.2 F (36.8 C)   Resp (!) 23   SpO2 96%  Vital signs in last 24 hours: Temp:  [98.2 F (36.8 C)-98.6 F (37 C)] 98.2 F (36.8 C) (11/18 2004) Pulse Rate:  [64-79] 66 (11/18 2030) Resp:  [16-23] 23 (11/18 2030) BP: (108-151)/(76-89) 151/82 (11/18 2030) SpO2:  [95 %-98 %] 96 % (11/18 2030)  GENERAL: Awake, alert in NAD HEENT: - Normocephalic and atraumatic, dry mm, no LN++, no Thyromegally LUNGS -occasional rhonchi CV - S1S2 RRR, no m/r/g, equal pulses bilaterally. ABDOMEN - Soft, nontender, nondistended with normoactive BS Ext: warm, well perfused, intact peripheral pulses, no edema  NEURO:  Mental Status: AA&Ox3  Language: speech is clear.  Naming, repetition, fluency, and comprehension intact. Cranial Nerves: PERRL . EOMI, visual fields full, no facial asymmetry,facial sensation intact, hearing intact, tongue/uvula/soft palate midline, normal sternocleidomastoid and trapezius muscle strength. No evidence of tongue atrophy or fibrillations Motor: 5/5 all over.  Normal tone.  Normal range of motion. Tone: is normal and bulk is normal Sensation- Intact to light touch bilaterally Coordination: FTN intact bilaterally, no ataxia in BLE Gait- deferred  NIHSS - 0 Labs I have reviewed labs in epic and the results pertinent to this  consultation are:  CBC    Component Value Date/Time   WBC 8.8 03/15/2017 1659   RBC 4.76 03/15/2017 1659   HGB 15.0 03/15/2017 1711   HGB 14.7 05/26/2016 1135   HCT 44.0 03/15/2017 1711   HCT 44.0 05/26/2016 1135   PLT 262 03/15/2017 1659   PLT 254 05/26/2016 1135   MCV 92.2 03/15/2017 1659   MCV 90 05/26/2016 1135   MCH 30.7 03/15/2017 1659   MCHC 33.3 03/15/2017 1659   RDW 14.0 03/15/2017 1659   RDW 14.0 05/26/2016 1135   LYMPHSABS 2.7 03/15/2017 1659   LYMPHSABS 2.5 05/26/2016 1135   MONOABS 0.7 03/15/2017 1659   EOSABS 0.1 03/15/2017 1659   EOSABS 0.1 05/26/2016 1135   BASOSABS  0.0 03/15/2017 1659   BASOSABS 0.0 05/26/2016 1135   CMP     Component Value Date/Time   NA 141 03/15/2017 1711   NA 140 12/15/2016 1523   K 3.3 (L) 03/15/2017 1711   CL 106 03/15/2017 1711   CO2 25 03/15/2017 1659   GLUCOSE 113 (H) 03/15/2017 1711   BUN 11 03/15/2017 1711   BUN 17 12/15/2016 1523   CREATININE 0.70 03/15/2017 1711   CALCIUM 9.3 03/15/2017 1659   PROT 6.1 (L) 03/15/2017 1659   PROT 6.3 12/15/2016 1523   ALBUMIN 3.5 03/15/2017 1659   ALBUMIN 4.1 12/15/2016 1523   AST 22 03/15/2017 1659   ALT 15 03/15/2017 1659   ALKPHOS 107 03/15/2017 1659   BILITOT 0.2 (L) 03/15/2017 1659   BILITOT <0.2 12/15/2016 1523   GFRNONAA >60 03/15/2017 1659   GFRAA >60 03/15/2017 1659    Lipid Panel     Component Value Date/Time   CHOL 179 05/26/2016 1135   TRIG 99 05/26/2016 1135   HDL 48 05/26/2016 1135   CHOLHDL 3.7 05/26/2016 1135   LDLCALC 111 (H) 05/26/2016 1135   Imaging I have reviewed the images obtained: MRI of the brain was done this morning, which revealed acute/subacute infarcts involving the posterior right sylvian fissure, right frontal operculum and right parietal white matter.  Periventricular and subcortical white matter changes bilaterally and moderately advanced for age also seen.  No evidence of bleed on GRE sequences.  Assessment:  61 year old woman with a past  medical history of hypertension, COPD, tobacco abuse, history of TIAs and MI in the past, fibromyalgia who started having symptoms of paresthesias in her hands on 03/05/2017 that completely resolved followed by some visual symptoms of seeing floaters which also completely resolved, who got an outpatient MRI for stroke workup that was positive for acute subacute infarcts in the right MCA territory. Likely cardioembolic versus atheroembolic.  Atypical location for small vessel disease.  Impression: Acute ischemic stroke in the right cerebral hemisphere-likely cardio embolic versus atheroembolic  Recommendations: -Admit to hospitalist -Telemetry monitoring -Allow for permissive hypertension for the first 24-48h - only treat PRN if SBP >220 mmHg. Blood pressures can be gradually normalized to SBP<140 upon discharge. -CT Angiogram of Head and neck -Echocardiogram -HgbA1c, fasting lipid panel -Frequent neuro checks -Prophylactic therapy-currently on aspirin.  Will recommend changing to Plavix. -Atorvastatin 80 mg PO daily -Risk factor modification -PT consult, OT consult, Speech consult  Please page stroke NP/PA/MD (listed on AMION)  from 8am-4 pm as this patient will be followed by the stroke team at this point. -- Amie Portland, MD [facility] [phone] If 7pm to 7am, please call on call as listed on AMION.

## 2017-03-15 NOTE — H&P (Signed)
History and Physical    Heidi Foster ZOX:[id number] DOB: 1955/12/26 DOA: 03/15/2017  PCP: Mina Marble D, NP  Patient coming from:  home  Chief Complaint:   Abnormal MRI  HPI: Heidi Foster is a 61 y.o. female with medical history significant of fibromyalgia, HTN, copd, tobacco abuse, h/o tia and mi comes in with tingling to bilateral hands and arms that started on 11/8 then had some associated vision changes and "floaters" on 11/13.  She takes a baby aspirin a day.  She went to see her PCP who ordered an mri which shows an infarct so she was called to come to ED.  She denies any symptoms at this time.  No numbness, tingling or weakness anywhere.  No vision changes.  No fevers.  No facial drooping, trouble speaking or swallowing.  Pt referred for admission for new infarct on mri and for further evalulation.  Neuro has been called also and will be seeing pt.  Review of Systems: As per HPI otherwise 10 point review of systems negative.   Past Medical History:  Diagnosis Date  . Allergy   . Anginal pain ([place])   . Arthritis   . Cancer ([place])    skin on nose  . Closed fracture of right patella   . Emphysema of lung ([place])   . GERD (gastroesophageal reflux disease)   . Heart attack ([place]) 1999  . Hypertension   . Osteopenia 12/2016   T score -1.2 FRAX 7.4%/0.8%  . Seborrheic keratoses   . Stroke ([place])    TIA x 2, early 2000's   . Vocal cord polyp     Past Surgical History:  Procedure Laterality Date  . BREAST BIOPSY    . BREAST SURGERY     Left Breast nodule markers  . CARDIAC CATHETERIZATION     1999  . caridac cath    . lt. ovary removed    . MICROLARYNGOSCOPY Bilateral 11/13/2016   Performed by Melissa Montane, MD at [facility] OR  . polyp removal  10/2016   throat -   . SKIN BIOPSY     nose  . TUBAL LIGATION       reports that she has been smoking cigarettes.  She started smoking about 40 years ago. She has a 20.00 pack-year smoking history. she has never used smokeless tobacco. She  reports that she does not drink alcohol or use drugs.  Allergies  Allergen Reactions  . Codeine Anaphylaxis  . Bupropion Other (See Comments)    Anger and mental issues    Family History  Problem Relation Age of Onset  . Diabetes Mother   . Hypertension Mother   . Stroke Mother   . Alcohol abuse Father   . Multiple sclerosis Sister   . Cancer Brother        lung  . Stroke Maternal Grandmother   . Heart attack Maternal Grandmother   . Hypertension Maternal Grandmother   . Colon cancer Neg Hx   . Colon polyps Neg Hx   . Esophageal cancer Neg Hx   . Stomach cancer Neg Hx   . Rectal cancer Neg Hx     Prior to Admission medications   Medication Sig Start Date End Date Taking? Authorizing Provider  albuterol (PROVENTIL HFA;VENTOLIN HFA) 108 (90 Base) MCG/ACT inhaler Inhale 2 puffs into the lungs every 4 (four) hours as needed for wheezing or shortness of breath. 06/30/16  Yes Javier Glazier, MD  amLODipine (NORVASC) 2.5 MG tablet Take 1 tablet (  2.5 mg total) by mouth daily. Stop taking Amlodipine 10mg . 06/26/16  Yes Danford, Valetta Fuller D, NP  aspirin EC 81 MG tablet Take 81 mg by mouth daily.   Yes [provider]  aspirin-acetaminophen-caffeine (EXCEDRIN MIGRAINE) [phone] MG tablet Take 1 tablet by mouth daily as needed for headache.   Yes [provider]  DULoxetine (CYMBALTA) 30 MG capsule 1 tablet daily for first week, then increase to two tabs once daily. Patient taking differently: Take 30 mg 2 (two) times daily by mouth.  03/09/17  Yes Danford, Valetta Fuller D, NP  losartan (COZAAR) 100 MG tablet TAKE ONE TABLET BY MOUTH ONCE DAILY Patient taking differently: TAKE ONE TABLET (100 MG)  BY MOUTH ONCE DAILY 11/04/16  Yes Danford, Katy D, NP  Multiple Vitamin (MULTIVITAMIN WITH MINERALS) TABS tablet Take 1 tablet daily by mouth.   Yes [provider]  umeclidinium-vilanterol (ANORO ELLIPTA) 62.5-25 MCG/INH AEPB Inhale 1 puff daily into the lungs.   Yes [provider]  verapamil (VERELAN PM) 180 MG 24 hr capsule TAKE ONE TABLET BY MOUTH ONCE DAILY Patient taking differently: TAKE ONE TABLET (180 MG) BY MOUTH ONCE DAILY 11/04/16  Yes Danford, Berna Spare, NP  ALPRAZolam (XANAX) 0.5 MG tablet Take 1 tablet (0.5 mg total) 2 (two) times daily as needed by mouth for anxiety. Patient not taking: Reported on 03/15/2017 03/11/17   Esaw Grandchild, NP  Spacer/Aero-Holding Josiah Lobo (AEROCHAMBER MV) inhaler Use as instructed 06/30/16   Javier Glazier, MD    Physical Exam: Vitals:   03/15/17 1900 03/15/17 1930 03/15/17 2004 03/15/17 2030  BP: 108/76 135/89  (!) 151/82  Pulse: 79 64  66  Resp: 16   (!) 23  Temp:   98.2 F (36.8 C)   TempSrc:      SpO2: 95% 96%  96%    Constitutional: NAD, calm, comfortable Vitals:   03/15/17 1900 03/15/17 1930 03/15/17 2004 03/15/17 2030  BP: 108/76 135/89  (!) 151/82  Pulse: 79 64  66  Resp: 16   (!) 23  Temp:   98.2 F (36.8 C)   TempSrc:      SpO2: 95% 96%  96%   Eyes: PERRL, lids and conjunctivae normal ENMT: Mucous membranes are moist. Posterior pharynx clear of any exudate or lesions.Normal dentition.  Neck: normal, supple, no masses, no thyromegaly Respiratory: clear to auscultation bilaterally, no wheezing, no crackles. Normal respiratory effort. No accessory muscle use.  Cardiovascular: Regular rate and rhythm, no murmurs / rubs / gallops. No extremity edema. 2+ pedal pulses. No carotid bruits.  Abdomen: no tenderness, no masses palpated. No hepatosplenomegaly. Bowel sounds positive.  Musculoskeletal: no clubbing / cyanosis. No joint deformity upper and lower extremities. Good ROM, no contractures. Normal muscle tone.  Skin: no rashes, lesions, ulcers. No induration Neurologic: CN 2-12 grossly intact. Sensation intact, DTR normal. Strength 5/5 in all 4.  Psychiatric: Normal judgment and insight. Alert and oriented x 3. Normal mood.    Labs on Admission: I have personally reviewed following labs  and imaging studies  CBC: Recent Labs  Lab 03/15/17 1659 03/15/17 1711  WBC 8.8  --   NEUTROABS 5.3  --   HGB 14.6 15.0  HCT 43.9 44.0  MCV 92.2  --   PLT 262  --    Basic Metabolic Panel: Recent Labs  Lab 03/15/17 1659 03/15/17 1711  NA 140 141  K 3.4* 3.3*  CL 109 106  CO2 25  --   GLUCOSE 103* 113*  BUN 9 11  CREATININE 0.66 0.70  CALCIUM 9.3  --    GFR: Estimated Creatinine Clearance: 63.6 mL/min (by C-G formula based on SCr of 0.7 mg/dL). Liver Function Tests: Recent Labs  Lab 03/15/17 1659  AST 22  ALT 15  ALKPHOS 107  BILITOT 0.2*  PROT 6.1*  ALBUMIN 3.5   No results for input(s): LIPASE, AMYLASE in the last 168 hours. No results for input(s): AMMONIA in the last 168 hours. Coagulation Profile: Recent Labs  Lab 03/15/17 1659  INR 0.97   Cardiac Enzymes: No results for input(s): CKTOTAL, CKMB, CKMBINDEX, TROPONINI in the last 168 hours. BNP (last 3 results) No results for input(s): PROBNP in the last 8760 hours. HbA1C: No results for input(s): HGBA1C in the last 72 hours. CBG: Recent Labs  Lab 03/15/17 2010  GLUCAP 87   Lipid Profile: No results for input(s): CHOL, HDL, LDLCALC, TRIG, CHOLHDL, LDLDIRECT in the last 72 hours. Thyroid Function Tests: No results for input(s): TSH, T4TOTAL, FREET4, T3FREE, THYROIDAB in the last 72 hours. Anemia Panel: No results for input(s): VITAMINB12, FOLATE, FERRITIN, TIBC, IRON, RETICCTPCT in the last 72 hours. Urine analysis: No results found for: COLORURINE, APPEARANCEUR, LABSPEC, PHURINE, GLUCOSEU, HGBUR, BILIRUBINUR, KETONESUR, PROTEINUR, UROBILINOGEN, NITRITE, LEUKOCYTESUR Sepsis Labs: !!!!!!!!!!!!!!!!!!!!!!!!!!!!!!!!!!!!!!!!!!!! @LABRCNTIP (procalcitonin:4,lacticidven:4) )No results found for this or any previous visit (from the past 240 hour(s)).   Radiological Exams on Admission: Mr Brain Wo Contrast  Result Date: 03/15/2017 CLINICAL DATA:  Paresthesias in both hands. EXAM: MRI HEAD WITHOUT  CONTRAST TECHNIQUE: Multiplanar, multiecho pulse sequences of the brain and surrounding structures were obtained without intravenous contrast. COMPARISON:  CT head without contrast 04/26/2015. FINDINGS: Brain: Restricted diffusion and associated T2 signal changes are present along the superior aspect the right sylvian fissure, inferior right frontal operculum, and right parietal white matter Additional periventricular and subcortical T2 changes are present bilaterally. A remote lacunar infarct is present within the right external capsule. The internal auditory canals are normal bilaterally. Brainstem and cerebellum are within normal limits. Vascular: Flow is present in the major intracranial arteries. The skullbase is within normal limits. Skull and upper cervical spine: The skullbase is within normal limits. The craniocervical junction is normal. Upper cervical spine is unremarkable. Sinuses/Orbits: The paranasal sinuses and mastoid air cells are clear. Globes and orbits are within normal limits. IMPRESSION: 1. Acute/subacute infarcts involving the high posterior right sylvian fissure, right frontal operculum, and right parietal white matter. This corresponds with the patient's left-sided hand and finger numbness. 2. Other periventricular and subcortical white matter changes bilaterally are moderately advanced for age and likely reflect the sequela of chronic microvascular ischemia. These results were called by telephone at the time of interpretation on 03/15/2017 at 11:10 am to [facility], NP, who verbally acknowledged these results. Electronically Signed   By: San Morelle M.D.   On: 03/15/2017 11:25    EKG: Independently reviewed.  nsr no acute issues Old chart reviewd Case discussed with edp  Assessment/Plan 61 yo female with subacute infarct on MRI  Principal Problem:   Stroke ([place])- full dose aspirin.  Symptom free right now.  Advised to let us know if any symptoms return.  Ck flp and  hga1c in am.  Obtain cardiac echo and carotid dopplers in am.  Follow up on neuro evaluation which is pending.  freq neuro cks.  PT/OT/ST eval.  Advised to stop smoking.  Active Problems:   Hypertension- cont home meds   Tobacco use disorder- advised to quit   History of  MI (myocardial infarction)- noted   History of TIA (transient ischemic attack)- noted   GERD (gastroesophageal reflux disease)- noted   Centrilobular emphysema ([place])- cont home nebs   Fibromyalgia- noted    DVT prophylaxis:  scds  Code Status:  full Family Communication: dtr Disposition Plan:  Per day team Consults called:  neurology Admission status:  observation   Keita Demarco A MD [facility]  If 7PM-7AM, please contact night-coverage [email or web address] Password [email or web address]  03/15/2017, 8:55 PM

## 2017-03-15 NOTE — ED Triage Notes (Signed)
Pt sent here by [facility] at [facility].  Pt had MRI this morning, showed stroke, was told to come to ED.  Onset 03-05-17 parathesia bilateral hands and visual problems that have resolved.  No facial droop, weakness, or slurred speech.  Pt reports just "feeling tired".

## 2017-03-15 NOTE — ED Notes (Signed)
ED Provider at bedside. 

## 2017-03-15 NOTE — ED Provider Notes (Signed)
[facility] EMERGENCY DEPARTMENT Provider Note   CSN: [id number] Arrival date & time: 03/15/17  1635     History   Chief Complaint Chief Complaint  Patient presents with  . Cerebrovascular Accident    HPI Carlisa Eble is a 61 y.o. female.  HPI   Patient is a 61 year old female presenting with stroke on MRI.  Ho TIA and "Heart attack" (no stents).  Patient had had some blurred vision happened x2 and some tingling in her hands bilateral.  Now resolved. Outpatient MRI.   Past Medical History:  Diagnosis Date  . Allergy   . Anginal pain ([place])   . Arthritis   . 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 Closed fracture of right patella   . Emphysema of lung ([place])   . GERD (gastroesophageal reflux disease)   . Heart attack ([place]) 1999  . Hypertension   . Osteopenia 12/2016   T score -1.2 FRAX 7.4%/0.8%  . Seborrheic keratoses   . Stroke ([place])    TIA x 2, early 2000's   . Vocal cord polyp     Patient Active Problem List   Diagnosis Date Noted  . Paresthesia of both hands 03/09/2017  . Hx of transient ischemic attack (TIA) 03/09/2017  . Fibromyalgia 01/22/2017  . Breast pain, left 12/15/2016  . Generalized weakness 12/15/2016  . Leg pain, bilateral 12/15/2016  . 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 Centrilobular emphysema ([place]) 09/09/2016  . GERD (gastroesophageal reflux disease) 06/30/2016  . Dysphagia 06/30/2016  . Health care maintenance 06/25/2016  . Other fatigue 05/26/2016  . Hypertension 05/26/2016  . Other hyperlipidemia 05/26/2016  . Family history of diabetes mellitus in mother 05/26/2016  . Acute pain of right shoulder 05/26/2016  . Tobacco use disorder 05/26/2016  . Dyspnea on exertion 05/26/2016  . Chronic pain of right knee 05/26/2016  . History of MI (myocardial infarction) 05/26/2016  . History of TIA (transient ischemic attack) 05/26/2016  . Numbness and tingling of left lower extremity 05/26/2016  . Numbness and tingling in left hand  05/26/2016  . 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    Past Surgical History:  Procedure Laterality Date  . BREAST BIOPSY    . BREAST SURGERY     Left Breast nodule markers  . CARDIAC CATHETERIZATION     1999  . caridac cath    . lt. ovary removed    . MICROLARYNGOSCOPY Bilateral 11/13/2016   Performed by Melissa Montane, MD at [facility] OR  . polyp removal  10/2016   throat -   . SKIN BIOPSY     nose  . TUBAL LIGATION      OB History    Gravida Para Term Preterm AB Living   1 1       1    SAB TAB Ectopic Multiple Live Births                   Home Medications    Prior to Admission medications   Medication Sig Start Date End Date Taking? Authorizing Provider  albuterol (PROVENTIL HFA;VENTOLIN HFA) 108 (90 Base) MCG/ACT inhaler Inhale 2 puffs into the lungs every 4 (four) hours as needed for wheezing or shortness of breath. 06/30/16  Yes Javier Glazier, MD  amLODipine (NORVASC) 2.5 MG tablet Take 1 tablet (2.5 mg total) by mouth daily. Stop taking Amlodipine 10mg . 06/26/16  Yes Danford, Valetta Fuller D, NP  aspirin EC 81 MG tablet Take 81 mg by mouth daily.  Yes [provider]  aspirin-acetaminophen-caffeine (EXCEDRIN MIGRAINE) [phone] MG tablet Take 1 tablet by mouth daily as needed for headache.   Yes [provider]  DULoxetine (CYMBALTA) 30 MG capsule 1 tablet daily for first week, then increase to two tabs once daily. Patient taking differently: Take 30 mg 2 (two) times daily by mouth.  03/09/17  Yes Danford, Valetta Fuller D, NP  losartan (COZAAR) 100 MG tablet TAKE ONE TABLET BY MOUTH ONCE DAILY Patient taking differently: TAKE ONE TABLET (100 MG)  BY MOUTH ONCE DAILY 11/04/16  Yes Danford, Katy D, NP  Multiple Vitamin (MULTIVITAMIN WITH MINERALS) TABS tablet Take 1 tablet daily by mouth.   Yes [provider]  umeclidinium-vilanterol (ANORO ELLIPTA) 62.5-25 MCG/INH AEPB Inhale 1 puff daily into the lungs.   Yes [provider]  verapamil (VERELAN PM) 180 MG 24 hr  capsule TAKE ONE TABLET BY MOUTH ONCE DAILY Patient taking differently: TAKE ONE TABLET (180 MG) BY MOUTH ONCE DAILY 11/04/16  Yes Danford, Berna Spare, NP  ALPRAZolam (XANAX) 0.5 MG tablet Take 1 tablet (0.5 mg total) 2 (two) times daily as needed by mouth for anxiety. Patient not taking: Reported on 03/15/2017 03/11/17   Esaw Grandchild, NP  Spacer/Aero-Holding Chambers (AEROCHAMBER MV) inhaler Use as instructed 06/30/16   Javier Glazier, MD    Family History Family History  Problem Relation Age of Onset  . Diabetes Mother   . Hypertension Mother   . Stroke Mother   . Alcohol abuse Father   . Multiple sclerosis Sister   . Cancer Brother        lung  . Stroke Maternal Grandmother   . Heart attack Maternal Grandmother   . Hypertension Maternal Grandmother   . Colon cancer Neg Hx   . Colon polyps Neg Hx   . Esophageal cancer Neg Hx   . Stomach cancer Neg Hx   . Rectal cancer Neg Hx     Social History Social History   Tobacco Use  . Smoking status: Current Every Day Smoker    Packs/day: 0.50    Years: 40.00    Pack years: 20.00    Types: Cigarettes    Start date: 09/27/1976  . Smokeless tobacco: Never Used  . Tobacco comment: Peak rate of 1ppd  Substance Use Topics  . Alcohol use: No  . Drug use: No     Allergies   Codeine and Bupropion   Review of Systems Review of Systems  Constitutional: Negative for activity change.  Respiratory: Negative for shortness of breath.   Cardiovascular: Negative for chest pain.  Gastrointestinal: Negative for abdominal pain.     Physical Exam Updated Vital Signs BP 108/76   Pulse 79   Temp 98.2 F (36.8 C)   Resp 16   SpO2 95%   Physical Exam  Constitutional: She is oriented to person, place, and time. She appears well-developed and well-nourished.  HENT:  Head: Normocephalic and atraumatic.  Eyes: Right eye exhibits no discharge. Left eye exhibits no discharge.  Cardiovascular: Normal rate and regular rhythm.  No murmur  heard. Pulmonary/Chest: Effort normal and breath sounds normal. No respiratory distress.  Abdominal: Soft. She exhibits no distension. There is no tenderness.  Neurological: She is oriented to person, place, and time.  Equal strength bilaterally upper and lower extremities negative pronator drift. Normal sensation bilaterally. Speech comprehensible, no slurring. Facial nerve tested and appears grossly normal. Alert and oriented 3.   Skin: Skin is warm and dry. She is  not diaphoretic.  Psychiatric: She has a normal mood and affect.  Nursing note and vitals reviewed.    ED Treatments / Results  Labs (all labs ordered are listed, but only abnormal results are displayed) Labs Reviewed  COMPREHENSIVE METABOLIC PANEL - Abnormal; Notable for the following components:      Result Value   Potassium 3.4 (*)    Glucose, Bld 103 (*)    Total Protein 6.1 (*)    Total Bilirubin 0.2 (*)    All other components within normal limits  I-STAT CHEM 8, ED - Abnormal; Notable for the following components:   Potassium 3.3 (*)    Glucose, Bld 113 (*)    All other components within normal limits  PROTIME-INR  APTT  CBC  DIFFERENTIAL  I-STAT TROPONIN, ED  CBG MONITORING, ED    EKG  EKG Interpretation  Date/Time:  Sunday March 15 2017 16:44:34 EST Ventricular Rate:  75 PR Interval:  136 QRS Duration: 102 QT Interval:  416 QTC Calculation: 464 R Axis:   56 Text Interpretation:  Normal sinus rhythm Normal ECG Normal sinus rhythm Confirmed by Zenovia Jarred [phone]) on 03/15/2017 7:54:10 PM       Radiology Mr Brain Wo Contrast  Result Date: 03/15/2017 CLINICAL DATA:  Paresthesias in both hands. EXAM: MRI HEAD WITHOUT CONTRAST TECHNIQUE: Multiplanar, multiecho pulse sequences of the brain and surrounding structures were obtained without intravenous contrast. COMPARISON:  CT head without contrast 04/26/2015. FINDINGS: Brain: Restricted diffusion and associated T2 signal changes are  present along the superior aspect the right sylvian fissure, inferior right frontal operculum, and right parietal white matter Additional periventricular and subcortical T2 changes are present bilaterally. A remote lacunar infarct is present within the right external capsule. The internal auditory canals are normal bilaterally. Brainstem and cerebellum are within normal limits. Vascular: Flow is present in the major intracranial arteries. The skullbase is within normal limits. Skull and upper cervical spine: The skullbase is within normal limits. The craniocervical junction is normal. Upper cervical spine is unremarkable. Sinuses/Orbits: The paranasal sinuses and mastoid air cells are clear. Globes and orbits are within normal limits. IMPRESSION: 1. Acute/subacute infarcts involving the high posterior right sylvian fissure, right frontal operculum, and right parietal white matter. This corresponds with the patient's left-sided hand and finger numbness. 2. Other periventricular and subcortical white matter changes bilaterally are moderately advanced for age and likely reflect the sequela of chronic microvascular ischemia. These results were called by telephone at the time of interpretation on 03/15/2017 at 11:10 am to [facility], NP, who verbally acknowledged these results. Electronically Signed   By: San Morelle M.D.   On: 03/15/2017 11:25    Procedures Procedures (including critical care time)  Medications Ordered in ED Medications - No data to display   Initial Impression / Assessment and Plan / ED Course  I have reviewed the triage vital signs and the nursing notes.  Pertinent labs & imaging results that were available during my care of the patient were reviewed by me and considered in my medical decision making (see chart for details).     Patient is a 61 year old female presenting with stroke on MRI.  Ho TIA and "Heart attack" (no stents).  Patient had had some blurred vision  happened x2 and some tingling in her hands bilateral.  Now resolved. Outpatient MRI.   8:16 PM Will discuss with neurology whether we need to admit or whether we can pursue outpatient work up, symptoms were on the Nov  8 Nov 13th.     Final Clinical Impressions(s) / ED Diagnoses   Final diagnoses:  None    ED Discharge Orders    None       Macarthur Critchley, MD 03/15/17 2150

## 2017-03-16 ENCOUNTER — Observation Stay (HOSPITAL_BASED_OUTPATIENT_CLINIC_OR_DEPARTMENT_OTHER): Payer: BLUE CROSS/BLUE SHIELD

## 2017-03-16 ENCOUNTER — Observation Stay (HOSPITAL_COMMUNITY): Payer: BLUE CROSS/BLUE SHIELD

## 2017-03-16 DIAGNOSIS — I10 Essential (primary) hypertension: Secondary | ICD-10-CM | POA: Diagnosis present

## 2017-03-16 DIAGNOSIS — I5030 Unspecified diastolic (congestive) heart failure: Secondary | ICD-10-CM

## 2017-03-16 DIAGNOSIS — R29700 NIHSS score 0: Secondary | ICD-10-CM | POA: Diagnosis present

## 2017-03-16 DIAGNOSIS — K219 Gastro-esophageal reflux disease without esophagitis: Secondary | ICD-10-CM | POA: Diagnosis present

## 2017-03-16 DIAGNOSIS — Z823 Family history of stroke: Secondary | ICD-10-CM | POA: Diagnosis not present

## 2017-03-16 DIAGNOSIS — E785 Hyperlipidemia, unspecified: Secondary | ICD-10-CM

## 2017-03-16 DIAGNOSIS — E876 Hypokalemia: Secondary | ICD-10-CM | POA: Diagnosis present

## 2017-03-16 DIAGNOSIS — Z8249 Family history of ischemic heart disease and other diseases of the circulatory system: Secondary | ICD-10-CM | POA: Diagnosis not present

## 2017-03-16 DIAGNOSIS — F17200 Nicotine dependence, unspecified, uncomplicated: Secondary | ICD-10-CM | POA: Diagnosis not present

## 2017-03-16 DIAGNOSIS — J449 Chronic obstructive pulmonary disease, unspecified: Secondary | ICD-10-CM | POA: Diagnosis present

## 2017-03-16 DIAGNOSIS — M797 Fibromyalgia: Secondary | ICD-10-CM | POA: Diagnosis present

## 2017-03-16 DIAGNOSIS — Z90721 Acquired absence of ovaries, unilateral: Secondary | ICD-10-CM | POA: Diagnosis not present

## 2017-03-16 DIAGNOSIS — I671 Cerebral aneurysm, nonruptured: Secondary | ICD-10-CM | POA: Diagnosis present

## 2017-03-16 DIAGNOSIS — I63511 Cerebral infarction due to unspecified occlusion or stenosis of right middle cerebral artery: Secondary | ICD-10-CM | POA: Diagnosis present

## 2017-03-16 DIAGNOSIS — I63411 Cerebral infarction due to embolism of right middle cerebral artery: Secondary | ICD-10-CM | POA: Diagnosis not present

## 2017-03-16 DIAGNOSIS — I252 Old myocardial infarction: Secondary | ICD-10-CM | POA: Diagnosis not present

## 2017-03-16 DIAGNOSIS — H53129 Transient visual loss, unspecified eye: Secondary | ICD-10-CM | POA: Diagnosis present

## 2017-03-16 DIAGNOSIS — I340 Nonrheumatic mitral (valve) insufficiency: Secondary | ICD-10-CM | POA: Diagnosis not present

## 2017-03-16 DIAGNOSIS — H538 Other visual disturbances: Secondary | ICD-10-CM | POA: Diagnosis present

## 2017-03-16 DIAGNOSIS — Z7982 Long term (current) use of aspirin: Secondary | ICD-10-CM | POA: Diagnosis not present

## 2017-03-16 DIAGNOSIS — Z833 Family history of diabetes mellitus: Secondary | ICD-10-CM | POA: Diagnosis not present

## 2017-03-16 DIAGNOSIS — I639 Cerebral infarction, unspecified: Secondary | ICD-10-CM | POA: Diagnosis not present

## 2017-03-16 DIAGNOSIS — Z8673 Personal history of transient ischemic attack (TIA), and cerebral infarction without residual deficits: Secondary | ICD-10-CM | POA: Diagnosis not present

## 2017-03-16 DIAGNOSIS — Z820 Family history of epilepsy and other diseases of the nervous system: Secondary | ICD-10-CM | POA: Diagnosis not present

## 2017-03-16 DIAGNOSIS — F17210 Nicotine dependence, cigarettes, uncomplicated: Secondary | ICD-10-CM | POA: Diagnosis present

## 2017-03-16 DIAGNOSIS — I6389 Other cerebral infarction: Secondary | ICD-10-CM | POA: Diagnosis not present

## 2017-03-16 LAB — LIPID PANEL
CHOL/HDL RATIO: 4.6 ratio
Cholesterol: 181 mg/dL (ref 0–200)
HDL: 39 mg/dL — AB (ref >40)
LDL Cholesterol: 92 mg/dL (ref 0–99)
Triglycerides: 251 mg/dL — ABNORMAL HIGH (ref <150)
VLDL: 50 mg/dL — ABNORMAL HIGH (ref 0–40)

## 2017-03-16 LAB — HEMOGLOBIN A1C
Hgb A1c MFr Bld: 5.6 % (ref 4.8–5.6)
MEAN PLASMA GLUCOSE: 114.02 mg/dL

## 2017-03-16 LAB — ECHOCARDIOGRAM COMPLETE
HEIGHTINCHES: 61 in
Weight: 2155.22 oz

## 2017-03-16 LAB — HIV ANTIBODY (ROUTINE TESTING W REFLEX): HIV Screen 4th Generation wRfx: NONREACTIVE

## 2017-03-16 MED ORDER — TRAZODONE HCL 50 MG PO TABS
50.0000 mg | ORAL_TABLET | Freq: Every evening | ORAL | Status: DC | PRN
  Administered 2017-03-16: 50 mg via ORAL
  Filled 2017-03-16: qty 1

## 2017-03-16 MED ORDER — POTASSIUM CHLORIDE CRYS ER 20 MEQ PO TBCR
40.0000 meq | EXTENDED_RELEASE_TABLET | Freq: Once | ORAL | Status: AC
  Administered 2017-03-16: 40 meq via ORAL
  Filled 2017-03-16: qty 2

## 2017-03-16 MED ORDER — CLOPIDOGREL BISULFATE 75 MG PO TABS
75.0000 mg | ORAL_TABLET | Freq: Every day | ORAL | Status: DC
  Administered 2017-03-16 – 2017-03-17 (×2): 75 mg via ORAL
  Filled 2017-03-16 (×2): qty 1

## 2017-03-16 MED ORDER — ATORVASTATIN CALCIUM 40 MG PO TABS
40.0000 mg | ORAL_TABLET | Freq: Every day | ORAL | Status: DC
  Administered 2017-03-16: 40 mg via ORAL
  Filled 2017-03-16: qty 1

## 2017-03-16 NOTE — Interval H&P Note (Signed)
History and Physical Interval Note:  03/16/2017 5:49 PM  Heidi Foster  has presented today for surgery, with the diagnosis of STROKE  The various methods of treatment have been discussed with the patient and family. After consideration of risks, benefits and other options for treatment, the patient has consented to  Procedure(s): TRANSESOPHAGEAL ECHOCARDIOGRAM (TEE) (N/A) as a surgical intervention .  The patient's history has been reviewed, patient examined, no change in status, stable for surgery.  I have reviewed the patient's chart and labs.  Questions were answered to the patient's satisfaction.     [name]

## 2017-03-16 NOTE — Progress Notes (Signed)
    [facility] has been requested to perform a transesophageal echocardiogram on 03/17/2017 for stoke.  After careful review of history and examination, the risks and benefits of transesophageal echocardiogram have been explained including risks of esophageal damage, perforation (1:10,000 risk), bleeding, pharyngeal hematoma as well as other potential complications associated with conscious sedation including aspiration, arrhythmia, respiratory failure and death. Alternatives to treatment were discussed, questions were answered. Patient is willing to proceed.   Rosaria Ferries, PA-C 03/16/2017 2:46 PM

## 2017-03-16 NOTE — Evaluation (Signed)
SLP Cancellation Note  Patient Details Name: Heidi Foster MRN: [id number] DOB: 10-Jun-1955   Cancelled treatment:       Reason Eval/Treat Not Completed: Other (comment)(pt with neurologist at this time will continue efforts)   Luanna Salk, [facility] SLP [phone]

## 2017-03-16 NOTE — Progress Notes (Addendum)
8264 Bedside shift report, pt resting in bed, NAD, denies pain, SOB, no complaints, will continue to monitor.   0800 Pt assessed, see flowsheet, denies weakness, pain, SOB. Updated with POC for the day. WCTM  0940 Pt medicated, see MAR, Neuro here to see pt. New orders received.   1145 Pt resting comfortably, stated she's ready to, "go home". No complaints. WCTM.   1500 Pt visiting with family, NAD.   1745 Pt resting in bed, NAD, updated with POC, WCTM.

## 2017-03-16 NOTE — Progress Notes (Signed)
  Echocardiogram 2D Echocardiogram has been performed.  Tresa Res 03/16/2017, 3:55 PM

## 2017-03-16 NOTE — Evaluation (Addendum)
Physical Therapy Evaluation Patient Details Name: Heidi Foster MRN: [id number] DOB: July 24, 1955 Today's Date: 03/16/2017   History of Present Illness  61 y.o. female with medical history significant of fibromyalgia, HTN, copd, tobacco abuse, h/o tia and mi comes in with tingling to bilateral hands and arms that started on 11/8 then had some associated vision changes and "floaters" on 11/13. CT showing Acute infarctions are present within right frontal operculum and right parietal white matter.  Clinical Impression  Pt admitted with above diagnosis. Pt currently with functional limitations due to the deficits listed below (see PT Problem List). At the time of PT eval pt was able to perform transfers and ambulation with gross supervision to min guard assist for balance support and safety. Pt reports she is essentially at her baseline of function, however at baseline she was noticing some increasing balance deficits. Pt did fairly well with the RW for support, however does have some decreased safety awareness with letting go of the walker with one hand frequently during gait training to gesture while talking. Overall feel she could mobilize well with her cane around the house, but will benefit from a RW for community ambulation distances. Pt will benefit from skilled PT to increase their independence and safety with mobility to allow discharge to the venue listed below.       Follow Up Recommendations Home health PT;Supervision - Intermittent    Equipment Recommendations  Rolling walker with 5" wheels    Recommendations for Other Services       Precautions / Restrictions Precautions Precautions: Fall Restrictions Weight Bearing Restrictions: No      Mobility  Bed Mobility Overal bed mobility: Modified Independent             General bed mobility comments: Increased time. No rails and HOB flat to simulate home environment.   Transfers Overall transfer level: Needs assistance Equipment  used: None Transfers: Sit to/from Stand Sit to Stand: Supervision         General transfer comment: Supervision for safety. No assist required.   Ambulation/Gait Ambulation/Gait assistance: Min guard;Supervision Ambulation Distance (Feet): 200 Feet Assistive device: Rolling walker (2 wheeled) Gait Pattern/deviations: Step-through pattern;Decreased stride length;Trunk flexed;Drifts right/left Gait velocity: Decreased Gait velocity interpretation: Below normal speed for age/gender General Gait Details: Gross supervision with the RW and occasional min guard assist required for balance support and safety. Pt bringing UE's off walker to gesture while talking, but was able to correct with cues.   Stairs Stairs: Yes Stairs assistance: Supervision Stair Management: Two rails;Step to pattern;Forwards Number of Stairs: 5 General stair comments: Pt demonstrating good technique and was able to complete without assistance and without difficulty.   Wheelchair Mobility    Modified Rankin (Stroke Patients Only)       Balance Overall balance assessment: Needs assistance Sitting-balance support: No upper extremity supported;Feet supported Sitting balance-Leahy Scale: Good Sitting balance - Comments: Able to perform LB ADLs while seated   Standing balance support: No upper extremity supported;During functional activity Standing balance-Leahy Scale: Fair Standing balance comment: Decreased balance to dynamic movements                             Pertinent Vitals/Pain Pain Assessment: No/denies pain    Home Living Family/patient expects to be discharged to:: Private residence Living Arrangements: Children Available Help at Discharge: Family;Available 24 hours/day Type of Home: Mobile home Home Access: Stairs to enter Entrance Stairs-Rails: Right;Left;Can reach both Entrance  Stairs-Number of Steps: 6 Home Layout: One level Home Equipment: Cane - single point      Prior  Function Level of Independence: Independent         Comments: ADLs and light IADLs; Cats; no driving; occasional use of cane     Hand Dominance   Dominant Hand: Right    Extremity/Trunk Assessment   Upper Extremity Assessment Upper Extremity Assessment: Defer to OT evaluation    Lower Extremity Assessment Lower Extremity Assessment: Overall WFL for tasks assessed(No N/T reported. Strength grossly 4+/5)    Cervical / Trunk Assessment Cervical / Trunk Assessment: Normal  Communication   Communication: No difficulties  Cognition Arousal/Alertness: Awake/alert Behavior During Therapy: WFL for tasks assessed/performed Overall Cognitive Status: Within Functional Limits for tasks assessed                                        General Comments      Exercises     Assessment/Plan    PT Assessment Patient needs continued PT services  PT Problem List Decreased strength;Decreased range of motion;Decreased activity tolerance;Decreased balance;Decreased mobility;Decreased knowledge of use of DME;Decreased safety awareness;Decreased knowledge of precautions;Pain       PT Treatment Interventions DME instruction;Gait training;Functional mobility training;Stair training;Therapeutic activities;Therapeutic exercise;Neuromuscular re-education;Patient/family education    PT Goals (Current goals can be found in the Care Plan section)  Acute Rehab PT Goals Patient Stated Goal: Go home today PT Goal Formulation: With patient Time For Goal Achievement: 03/30/17 Potential to Achieve Goals: Good    Frequency Min 4X/week   Barriers to discharge        Co-evaluation               AM-PAC PT "6 Clicks" Daily Activity  Outcome Measure Difficulty turning over in bed (including adjusting bedclothes, sheets and blankets)?: None Difficulty moving from lying on back to sitting on the side of the bed? : None Difficulty sitting down on and standing up from a chair  with arms (e.g., wheelchair, bedside commode, etc,.)?: A Little Help needed moving to and from a bed to chair (including a wheelchair)?: A Little Help needed walking in hospital room?: A Little Help needed climbing 3-5 steps with a railing? : A Little 6 Click Score: 20    End of Session Equipment Utilized During Treatment: Gait belt Activity Tolerance: Patient tolerated treatment well Patient left: in bed;with call bell/phone within reach Nurse Communication: Mobility status PT Visit Diagnosis: Unsteadiness on feet (R26.81);Other symptoms and signs involving the nervous system (R29.898)    Time: 7782-4235 PT Time Calculation (min) (ACUTE ONLY): 26 min   Charges:   PT Evaluation $PT Eval Moderate Complexity: 1 Mod PT Treatments $Gait Training: 8-22 mins   PT G Codes:   PT G-Codes **NOT FOR INPATIENT CLASS** Functional Assessment Tool Used: Clinical judgement Functional Limitation: Mobility: Walking and moving around Mobility: Walking and Moving Around Current Status (T6144): At least 20 percent but less than 40 percent impaired, limited or restricted Mobility: Walking and Moving Around Goal Status [phone]): At least 1 percent but less than 20 percent impaired, limited or restricted    Rolinda Roan, PT, DPT Acute Rehabilitation Services Pager: [phone] 03/16/2017, 9:29 AM

## 2017-03-16 NOTE — Progress Notes (Signed)
STROKE TEAM PROGRESS NOTE   SUBJECTIVE (INTERVAL HISTORY) No family is at the bedside.  Patient is found lying in bed in no acute distress. Overall she feels her condition is completely resolved. Voices no new complaints. No new events reported overnight.  OBJECTIVE Lab Results: CBC:  Recent Labs  Lab 03/15/17 1659 03/15/17 1711  WBC 8.8  --   HGB 14.6 15.0  HCT 43.9 44.0  MCV 92.2  --   PLT 262  --    BMP: Recent Labs  Lab 03/15/17 1659 03/15/17 1711  NA 140 141  K 3.4* 3.3*  CL 109 106  CO2 25  --   GLUCOSE 103* 113*  BUN 9 11  CREATININE 0.66 0.70  CALCIUM 9.3  --    PHYSICAL EXAM Temp:  [97.7 F (36.5 C)-99.1 F (37.3 C)] 98.1 F (36.7 C) (11/19 0950) Pulse Rate:  [62-85] 72 (11/19 0950) Resp:  [15-23] 20 (11/19 0950) BP: (108-151)/(56-89) 128/82 (11/19 0950) SpO2:  [93 %-98 %] 93 % (11/19 0951) Weight:  [61.1 kg (134 lb 11.2 oz)] 61.1 kg (134 lb 11.2 oz) (11/18 2141) General - Well nourished, well developed, in no apparent distress Respiratory - Lungs clear bilaterally. No wheezing. Cardiovascular - Regular rate and rhythm   NEURO:  Mental Status: AA&Ox3  Language: speech is clear.  Naming, repetition, fluency, and comprehension intact. Cranial Nerves: PERRL . EOMI, visual fields full, no facial asymmetry,facial sensation intact, hearing intact, tongue/uvula/soft palate midline, normal sternocleidomastoid and trapezius muscle strength. No evidence of tongue atrophy or fibrillations Motor: 5/5 all over.  Normal tone.  Normal range of motion. Tone: is normal and bulk is normal Sensation- Intact to light touch bilaterally Coordination: FTN intact bilaterally, no ataxia in BLE Gait- deferred  IMAGING: I have personally reviewed the radiological images below and agree with the radiology interpretations.  Ct Angio Head & Neck W Or Wo Contrast Result Date: 03/16/2017 IMPRESSION: CT head: Acute infarctions are present within right frontal operculum and right  parietal white matter as seen on prior MRI. No acute hemorrhage. No new acute intracranial abnormality. CTA neck: 1. Moderate calcific atherosclerosis of aortic arch, great vessel origins, and bilateral carotid bifurcations. 2. No evidence of dissection, hemodynamically significant stenosis by NASCET criteria, occlusion, or aneurysm. 3. Moderate cervical spine degenerative changes. 4. Dental disease with periapical cysts and dental caries greatest in maxillary molars. CTA head: 1. Small M3 branch vessel thromboses of the right distal MCA superior division in the region of acute infarctions. 2. 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 Early branching right MCA. 3. 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 4. Otherwise no additional intracranial large vessel occlusion, aneurysm, or significant stenosis.  Mr Brain Wo Contrast Result Date: 03/15/2017 IMPRESSION: 1. Acute/subacute infarcts involving the high posterior right sylvian fissure, right frontal operculum, and right parietal white matter. This corresponds with the patient's left-sided hand and finger numbness. 2. Other periventricular and subcortical white matter changes bilaterally are moderately advanced for age and likely reflect the sequela of chronic microvascular ischemia.   Echocardiogram: not done    PENDING  B/L LE Duplex: PENDING _____________________________________________________________________ ASSESSMENT: [name] is a 61 y.o. female with PMH of hypertension, COPD, tobacco abuse, history of TIAs in the past, history of an MI status post cardiac catheterization with no stents, fibromyalgia, who was in her usual state of health until 03/05/2017 when she noted that her hands felt tingly-pins and needles.  ACUTE & SUBACUTE RIGHT MCA STROKES: within right frontal operculum, posterior right sylvian fissure,  and right parietal white matter Suspected Etiology: Likely cardioembolic versus  atheroembolic.  Atypical location for small vessel disease Resultant Symptoms: paresthesias in her hands on 03/05/2017 that completely resolved followed by some visual symptoms of seeing floaters which also completely resolved  Stroke Risk Factors: hypertension, smoking and Hx TIA's, MI in the past Other Stroke Risk Factors: Advanced age, Family Hx Stroke  Outstanding Stroke Work-up Studies: Echocardiogram: not done PENDING B/L LE Duplex: PENDING TEE/ Loop Recorder Placement Scheduled for AM 03/17/17  PLAN  03/16/2017: Continue Plavix and Statin Ongoing aggressive stroke risk factor management Patient counseled to be compliant with her antithrombotic medications Outpatient TEE and Loop Recorder Placement Scheduled for AM 03/17/17  R/O AFIB, CHRONIC: Outpatient TEE and Loop Recorder Placement Scheduled for AM 03/17/17  HYPERTENSION: Stable Permissive hypertension (OK if <220/120) for 24-48 hours post stroke and then gradually normalized within 5-7 days. Long term BP goal normotensive. May slowly restart home B/P medications after 48 hours Home Meds: Norvasc, Cozaar, Verapamil  HYPERLIPIDEMIA:    Component Value Date/Time   CHOL 181 03/16/2017 0345   CHOL 179 05/26/2016 1135   TRIG 251 (H) 03/16/2017 0345   HDL 39 (L) 03/16/2017 0345   HDL 48 05/26/2016 1135   CHOLHDL 4.6 03/16/2017 0345   VLDL 50 (H) 03/16/2017 0345   LDLCALC 92 03/16/2017 0345   LDLCALC 111 (H) 05/26/2016 1135  Home Meds:  NONE LDL  goal < 70 Started on Lipitor to 40 mg daily Continue statin at discharge, PCP to monitor need to start Fenofibrate  R/O DIABETES: Lab Results  Component Value Date   HGBA1C 5.6 03/16/2017  HgbA1c goal < 7.0  TOBACCO ABUSE Current smoker Smoking cessation counseling provided Nicotine patch provided  Other Active Problems: Principal Problem:   Stroke ([place]) Active Problems:   Hypertension   Tobacco use disorder   History of MI (myocardial infarction)   History of  TIA (transient ischemic attack)   GERD (gastroesophageal reflux disease)   Centrilobular emphysema ([place])   Fibromyalgia  Hospital day # 0  VTE prophylaxis: SCD's  Diet : Diet Heart Room service appropriate? Yes; Fluid consistency: Thin  Hospital Stroke Medications:Plavix 75 mg and Lipitor 40 mg Home Stroke Medications: ASA 81 mg Stroke Meds Plan: aspirin 81 mg daily Prior to Admission,  Now on clopidogrel 75 mg daily  Discharge Stroke Meds: Please discharge patient on clopidogrel 75 mg daily   Disposition: 01-Home or Self Care Therapy Recs:  NONE Follow Recs: Follow up with [facility] in 6 weeks  FAMILY UPDATES: No family at bedside   Renie Ora Stroke Neurology Team 03/16/2017 12:25 PM   I reviewed above note and agree with the assessment and plan. I have made any additions or clarifications directly to the above note. Pt was seen and examined. Pt admitted for episode of b/l hand tingling on 03/05/17 and floater in vision 03/10/17, both lasted about one day and resolved. MRI right MCA frontal subcortical infarct. CTA h/n right M3 thrombosis as well as b/l M2 aneurysms 4.7/4.67mm, and atherosclerosis b/l siphon, proximal ICA. TTE pending, LDL 92 and A1C 5.6. Change ASA to plavix and put on lipitor for stroke prevention. Recommend smoking cessation. Due to concern of cardioembolic stroke, will do TEE and loop. Her cerebral aneurysms needs outpt follow up and referral to IR.   Rosalin Hawking, MD PhD Stroke Neurology 03/16/2017 3:17 PM  To contact Stroke Continuity provider, please refer to [email or web address] After hours, contact General Neurology

## 2017-03-16 NOTE — Care Management Note (Signed)
Case Management Note  Patient Details  Name: Ellanora Rayborn MRN: [id number] Date of Birth: 1955/09/12  Subjective/Objective:   Pt admitted with CVA. She is from home with son.                  Action/Plan: Pt with orders for [facility] services. CM provided her choice of [place] agencies and she selected [place]. [facility] notified and accepted the referral.  Pt with orders for walker. Jermaine with [facility] notified and will deliver the equipment to the room. CM following.  Expected Discharge Date:  03/19/17               Expected Discharge Plan:  [place]  In-House Referral:     Discharge planning Services  CM Consult  Post Acute Care Choice:  Home Health, Durable Medical Equipment Choice offered to:  Patient  DME Arranged:  Walker rolling DME Agency:  [place] Arranged:  PT [place]:  [place]  Status of Service:  Completed, signed off  If discussed at [place] of Stay Meetings, dates discussed:    Additional Comments:  Pollie Friar, RN 03/16/2017, 4:27 PM

## 2017-03-16 NOTE — Evaluation (Signed)
Occupational Therapy Evaluation Patient Details Name: Heidi Foster MRN: [id number] DOB: 1955/10/10 Today's Date: 03/16/2017    History of Present Illness 61 y.o. female with medical history significant of fibromyalgia, HTN, copd, tobacco abuse, h/o tia and mi comes in with tingling to bilateral hands and arms that started on 11/8 then had some associated vision changes and "floaters" on 11/13. CT showing Acute infarctions are present within right frontal operculum and right parietal white matter.   Clinical Impression   PTA, pt was living with her son and daughter-in-law and was independent with ADLs and light IADLs. Pt performing ADLs near baseline function with Min Guard A for standing ADLs and functional mobility due to decreased balance. Recommend dc home once medically stable per physician with initial 24 hour supervision. All acute OT needs met and will sign off. Thank you.    Follow Up Recommendations  No OT follow up;Supervision/Assistance - 24 hour    Equipment Recommendations  None recommended by OT    Recommendations for Other Services PT consult     Precautions / Restrictions Precautions Precautions: Fall Restrictions Weight Bearing Restrictions: No      Mobility Bed Mobility Overal bed mobility: Modified Independent             General bed mobility comments: Increased time  Transfers Overall transfer level: Needs assistance Equipment used: None Transfers: Sit to/from Stand Sit to Stand: Supervision;Min guard         General transfer comment: Min Guard for safety because of decreased balance    Balance Overall balance assessment: Needs assistance Sitting-balance support: No upper extremity supported;Feet supported Sitting balance-Leahy Scale: Good Sitting balance - Comments: Able to perform LB ADLs while seated   Standing balance support: No upper extremity supported;During functional activity Standing balance-Leahy Scale: Fair Standing balance  comment: Decreased balance to dynimic movements                           ADL either performed or assessed with clinical judgement   ADL Overall ADL's : Needs assistance/impaired                                       General ADL Comments: Pt performing ADLs and IADLs at supervision-Min guard level for safety. Pt with decreased balance in dynamic movement. Pt performing ADLs near baseline function.     Vision Baseline Vision/History: Wears glasses Wears Glasses: Reading only Patient Visual Report: Other (comment)(Reports floaters at admission; back to baseline) Additional Comments: WFL eye alignment and tracking     Perception     Praxis      Pertinent Vitals/Pain Pain Assessment: No/denies pain     Hand Dominance Right   Extremity/Trunk Assessment Upper Extremity Assessment Upper Extremity Assessment: Overall WFL for tasks assessed   Lower Extremity Assessment Lower Extremity Assessment: Defer to PT evaluation   Cervical / Trunk Assessment Cervical / Trunk Assessment: Normal   Communication Communication Communication: No difficulties   Cognition Arousal/Alertness: Awake/alert Behavior During Therapy: WFL for tasks assessed/performed Overall Cognitive Status: Within Functional Limits for tasks assessed                                     General Comments       Exercises     Shoulder Instructions  Home Living Family/patient expects to be discharged to:: Private residence Living Arrangements: Children Available Help at Discharge: Family;Available 24 hours/day Type of Home: Mobile home Home Access: Stairs to enter [employer] of Steps: 6 Entrance Stairs-Rails: Right;Left;Can reach both Home Layout: One level     Bathroom Shower/Tub: Corporate investment banker: Standard     Home Equipment: Cane - single point          Prior Functioning/Environment Level of Independence:  Independent        Comments: ADLs and light IADLs; Cats; no driving; occasional use of cane        OT Problem List: Decreased activity tolerance;Impaired balance (sitting and/or standing);Decreased safety awareness;Decreased knowledge of use of DME or AE      OT Treatment/Interventions:      OT Goals(Current goals can be found in the care plan section) Acute Rehab OT Goals Patient Stated Goal: Go home today OT Goal Formulation: With patient Time For Goal Achievement: 03/30/17 Potential to Achieve Goals: Good  OT Frequency:     Barriers to D/C:            Co-evaluation              AM-PAC PT "6 Clicks" Daily Activity     Outcome Measure Help from another person eating meals?: None Help from another person taking care of personal grooming?: A Little Help from another person toileting, which includes using toliet, bedpan, or urinal?: A Little Help from another person bathing (including washing, rinsing, drying)?: A Little Help from another person to put on and taking off regular upper body clothing?: None Help from another person to put on and taking off regular lower body clothing?: A Little 6 Click Score: 20   End of Session Nurse Communication: Mobility status  Activity Tolerance: Patient tolerated treatment well Patient left: in bed;with call bell/phone within reach;with nursing/sitter in room  OT Visit Diagnosis: Unsteadiness on feet (R26.81);Other abnormalities of gait and mobility (R26.89);Muscle weakness (generalized) (M62.81)                Time: 9450-3888 OT Time Calculation (min): 19 min Charges:  OT General Charges $OT Visit: 1 Visit OT Evaluation $OT Eval Moderate Complexity: 1 Mod G-Codes: OT G-codes **NOT FOR INPATIENT CLASS** Functional Assessment Tool Used: Clinical judgement Functional Limitation: Self care Self Care Current Status (K8003): At least 1 percent but less than 20 percent impaired, limited or restricted Self Care Goal Status  (K9179): 0 percent impaired, limited or restricted Self Care Discharge Status [phone]): At least 1 percent but less than 20 percent impaired, limited or restricted   [place], OTR/L Acute Rehab Pager: [phone] Office: [place] 03/16/2017, 9:03 AM

## 2017-03-16 NOTE — Progress Notes (Signed)
Patient ID: Heidi Foster, female   DOB: 16-Feb-1956, 61 y.o.   MRN: [id number]    PROGRESS NOTE  Heidi Foster  HCW:[id number] DOB: [birth year] DOA: 03/15/2017  PCP: Mina Marble D, NP   Brief Narrative:  Pt is 62 yo female with known COPD and ongoing tobacco use, COPD, previous TIA's, presented to [facility] ED with main concern of bilateral hands tingling and floaters in vision. She has seen doctor for this and MRI was ordered, revealed R MCA territory infarct and pt was sent to ED for further evaluation.   Assessment & Plan:   Principal Problem:   Stroke [facility]), acute ischemic stroke in the right cerebral hemisphere - in pt with multiple risk factors including HTN, ongoing tobacco use, previous TIA's and MI - suspect this to be cardioembolic vs atheroembolic  - pt reports feeling better, stroke team following, appreciate assistance - BP so far stable - A1C 5.6, LDLD 92, Cholesterol 181 - continue on Plavix, statin - PT eval done, recommend HH PT, rolling walker, orders placed  - ECHO pending  - follow up on neurology team additional recommendations   Active Problems:   Hypertension, essential - BP has remained stable     Tobacco use disorder, COPD - counseled on cessation and verbalized understanding     Hypokalemia - supplement and repeat BMP in AM  DVT prophylaxis: SCD's, Plavix  Code Status: Full  Family Communication: Patient at bedside  Disposition Plan: to be determined   Consultants:   Neurology, stroke team   Procedures:   None  Antimicrobials:   None  Subjective: Pt reports feeling better this AM.   Objective: Vitals:   03/16/17 0223 03/16/17 0400 03/16/17 0950 03/16/17 0951  BP: (!) 118/56 135/88 128/82   Foster: 70 68 72   Resp: 16 16 20    Temp: 98.8 F (37.1 C) 98.8 F (37.1 C) 98.1 F (36.7 C)   TempSrc: Oral Oral Oral   SpO2: 97% 95% 95% 93%  Weight:      Height:        Intake/Output Summary (Last 24 hours) at 03/16/2017 1101 Last data filed at  03/16/2017 0800 Gross per 24 hour  Intake -  Output 1 ml  Net -1 ml   Filed Weights   03/15/17 2141  Weight: 61.1 kg (134 lb 11.2 oz)    Examination:  General exam: Appears calm and comfortable  Respiratory system: Clear to auscultation. Respiratory effort normal. Cardiovascular system: S1 & S2 heard, RRR. No JVD, rubs, gallops or clicks. No pedal edema. Gastrointestinal system: Abdomen is nondistended, soft and nontender. No organomegaly or masses felt.  Central nervous system: Alert and oriented. No focal neurological deficits. Extremities: Symmetric 5 x 5 power. Skin: No rashes, lesions or ulcers Psychiatry: Judgement and insight appear normal. Mood & affect appropriate.   Data Reviewed: I have personally reviewed following labs and imaging studies  CBC: Recent Labs  Lab 03/15/17 1659 03/15/17 1711  WBC 8.8  --   NEUTROABS 5.3  --   HGB 14.6 15.0  HCT 43.9 44.0  MCV 92.2  --   PLT 262  --    Basic Metabolic Panel: Recent Labs  Lab 03/15/17 1659 03/15/17 1711  NA 140 141  K 3.4* 3.3*  CL 109 106  CO2 25  --   GLUCOSE 103* 113*  BUN 9 11  CREATININE 0.66 0.70  CALCIUM 9.3  --    Liver Function Tests: Recent Labs  Lab 03/15/17 1659  AST 22  ALT 15  ALKPHOS 107  BILITOT 0.2*  PROT 6.1*  ALBUMIN 3.5   Coagulation Profile: Recent Labs  Lab 03/15/17 1659  INR 0.97   HbA1C: Recent Labs    03/16/17 0345  HGBA1C 5.6   CBG: Recent Labs  Lab 03/15/17 2010  GLUCAP 87   Lipid Profile: Recent Labs    03/16/17 0345  CHOL 181  HDL 39*  LDLCALC 92  TRIG 251*  CHOLHDL 4.6    Radiology Studies: Ct Angio Head W Or Wo Contrast  Result Date: 03/16/2017 CLINICAL DATA:  61 y/o  F; follow-up of stroke. EXAM: CT ANGIOGRAPHY HEAD AND NECK TECHNIQUE: Multidetector CT imaging of the head and neck was performed using the standard protocol during bolus administration of intravenous contrast. Multiplanar CT image reconstructions and MIPs were obtained  to evaluate the vascular anatomy. Carotid stenosis measurements (when applicable) are obtained utilizing NASCET criteria, using the distal internal carotid diameter as the denominator. CONTRAST:  33mL ISOVUE-370 IOPAMIDOL (ISOVUE-370) INJECTION 76% COMPARISON:  03/15/2017 MRI of the brain FINDINGS: CT HEAD FINDINGS Brain: Small areas of hypoattenuation within the right frontal operculum and right parietal white matter corresponding to regions of acute infarction on prior MRI. No findings of interval acute infarct, hemorrhage, or focal mass effect. Small chronic lacunar infarct within the right mid putamen. Stable chronic microvascular ischemic changes and parenchymal volume loss of the brain. Vascular: Small branching linear densities are present within the right posterior sylvian fissure (series 3 image 16 and 17). Skull: Normal. Negative for fracture or focal lesion. Sinuses: Imaged portions are clear. Orbits: No acute finding. Review of the MIP images confirms the above findings CTA NECK FINDINGS Aortic arch: Standard branching. Imaged portion shows no evidence of aneurysm or dissection. Moderate calcific atherosclerosis of the aortic arch and great vessel origins with mild less than 50% stenosis of left common carotid artery and left subclavian artery origins. Right carotid system: No evidence of dissection, stenosis (50% or greater) or occlusion. Moderate calcified plaque of the carotid bifurcation with mild less than 50% proximal ICA stenosis. Left carotid system: No evidence of dissection, stenosis (50% or greater) or occlusion. Moderate calcified plaque of the carotid bifurcation with mild less than 50% proximal ICA stenosis. Vertebral arteries: Codominant. No evidence of dissection, stenosis (50% or greater) or occlusion. Skeleton: Odontogenic disease with dental caries and periapical cyst involving the bilateral maxillary molars. Moderate cervical spondylosis with multilevel disc and facet degenerative  changes. Uncovertebral and facet hypertrophy encroaches on the neural foramen greatest at the left C3-4 and right C5-C7 levels. No high-grade bony canal stenosis. Other neck: Negative. Upper chest: Negative. Review of the MIP images confirms the above findings CTA HEAD FINDINGS Anterior circulation: Small branching linear densities on noncontrast CT of head in the right posterior sylvian fissure correspond with small branch vessel thromboses of right distal MCA superior division (series 10, image 76 and 74 and series 13, image 13). 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 3.5 x 4.3 x 2.7 mm aneurysm of the origin of left MCA superior division (series 10, image 95 and series 11, image 84). Mild calcific atherosclerosis of carotid siphons without significant stenosis. Otherwise no large vessel occlusion, aneurysm, or significant stenosis is identified. Posterior circulation: No significant stenosis, proximal occlusion, aneurysm, or vascular malformation. Venous sinuses: As permitted by contrast timing, patent. Anatomic variants: Diminutive bilateral  posterior communicating arteries and small anterior communicating artery. Delayed phase: No abnormal intracranial enhancement. Review of the MIP images confirms the above findings IMPRESSION: CT head: Acute infarctions are present within right frontal operculum and right parietal white matter as seen on prior MRI. No acute hemorrhage. No new acute intracranial abnormality. CTA neck: 1. Moderate calcific atherosclerosis of aortic arch, great vessel origins, and bilateral carotid bifurcations. 2. No evidence of dissection, hemodynamically significant stenosis by NASCET criteria, occlusion, or aneurysm. 3. Moderate cervical spine degenerative changes. 4. Dental disease with periapical cysts and dental caries greatest in maxillary molars. CTA head: 1. Small M3 branch  vessel thromboses of the right distal MCA superior division in the region of acute infarctions. 2. 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 Early branching right MCA. 3. 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 4. Otherwise no additional intracranial large vessel occlusion, aneurysm, or significant stenosis. These results will be called to the ordering clinician or representative by the Radiologist Assistant, and communication documented in the PACS or zVision Dashboard. Electronically Signed   By: Kristine Garbe M.D.   On: 03/16/2017 00:53   Ct Angio Neck W Or Wo Contrast  Result Date: 03/16/2017 CLINICAL DATA:  61 y/o  F; follow-up of stroke. EXAM: CT ANGIOGRAPHY HEAD AND NECK TECHNIQUE: Multidetector CT imaging of the head and neck was performed using the standard protocol during bolus administration of intravenous contrast. Multiplanar CT image reconstructions and MIPs were obtained to evaluate the vascular anatomy. Carotid stenosis measurements (when applicable) are obtained utilizing NASCET criteria, using the distal internal carotid diameter as the denominator. CONTRAST:  53mL ISOVUE-370 IOPAMIDOL (ISOVUE-370) INJECTION 76% COMPARISON:  03/15/2017 MRI of the brain FINDINGS: CT HEAD FINDINGS Brain: Small areas of hypoattenuation within the right frontal operculum and right parietal white matter corresponding to regions of acute infarction on prior MRI. No findings of interval acute infarct, hemorrhage, or focal mass effect. Small chronic lacunar infarct within the right mid putamen. Stable chronic microvascular ischemic changes and parenchymal volume loss of the brain. Vascular: Small branching linear densities are present within the right posterior sylvian fissure (series 3 image 16 and 17). Skull: Normal. Negative for fracture or focal lesion. Sinuses: Imaged portions are clear. Orbits: No acute finding. Review of the MIP images confirms the  above findings CTA NECK FINDINGS Aortic arch: Standard branching. Imaged portion shows no evidence of aneurysm or dissection. Moderate calcific atherosclerosis of the aortic arch and great vessel origins with mild less than 50% stenosis of left common carotid artery and left subclavian artery origins. Right carotid system: No evidence of dissection, stenosis (50% or greater) or occlusion. Moderate calcified plaque of the carotid bifurcation with mild less than 50% proximal ICA stenosis. Left carotid system: No evidence of dissection, stenosis (50% or greater) or occlusion. Moderate calcified plaque of the carotid bifurcation with mild less than 50% proximal ICA stenosis. Vertebral arteries: Codominant. No evidence of dissection, stenosis (50% or greater) or occlusion. Skeleton: Odontogenic disease with dental caries and periapical cyst involving the bilateral maxillary molars. Moderate cervical spondylosis with multilevel disc and facet degenerative changes. Uncovertebral and facet hypertrophy encroaches on the neural foramen greatest at the left C3-4 and right C5-C7 levels. No high-grade bony canal stenosis. Other neck: Negative. Upper chest: Negative. Review of the MIP images confirms the above findings CTA HEAD FINDINGS Anterior circulation: Small branching linear densities on noncontrast CT of head in the right posterior sylvian fissure  correspond with small branch vessel thromboses of right distal MCA superior division (series 10, image 76 and 74 and series 13, image 13). 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 3.5 x 4.3 x 2.7 mm aneurysm of the origin of left MCA superior division (series 10, image 95 and series 11, image 84). Mild calcific atherosclerosis of carotid siphons without significant stenosis. Otherwise no large vessel occlusion, aneurysm, or significant stenosis is identified. Posterior  circulation: No significant stenosis, proximal occlusion, aneurysm, or vascular malformation. Venous sinuses: As permitted by contrast timing, patent. Anatomic variants: Diminutive bilateral posterior communicating arteries and small anterior communicating artery. Delayed phase: No abnormal intracranial enhancement. Review of the MIP images confirms the above findings IMPRESSION: CT head: Acute infarctions are present within right frontal operculum and right parietal white matter as seen on prior MRI. No acute hemorrhage. No new acute intracranial abnormality. CTA neck: 1. Moderate calcific atherosclerosis of aortic arch, great vessel origins, and bilateral carotid bifurcations. 2. No evidence of dissection, hemodynamically significant stenosis by NASCET criteria, occlusion, or aneurysm. 3. Moderate cervical spine degenerative changes. 4. Dental disease with periapical cysts and dental caries greatest in maxillary molars. CTA head: 1. Small M3 branch vessel thromboses of the right distal MCA superior division in the region of acute infarctions. 2. 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 Early branching right MCA. 3. 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 4. Otherwise no additional intracranial large vessel occlusion, aneurysm, or significant stenosis. These results will be called to the ordering clinician or representative by the Radiologist Assistant, and communication documented in the PACS or zVision Dashboard. Electronically Signed   By: Kristine Garbe M.D.   On: 03/16/2017 00:53   Mr Brain Wo Contrast  Result Date: 03/15/2017 CLINICAL DATA:  Paresthesias in both hands. EXAM: MRI HEAD WITHOUT CONTRAST TECHNIQUE: Multiplanar, multiecho Foster sequences of the brain and surrounding structures were obtained without intravenous contrast. COMPARISON:  CT head without contrast 04/26/2015. FINDINGS: Brain: Restricted diffusion and associated T2 signal  changes are present along the superior aspect the right sylvian fissure, inferior right frontal operculum, and right parietal white matter Additional periventricular and subcortical T2 changes are present bilaterally. A remote lacunar infarct is present within the right external capsule. The internal auditory canals are normal bilaterally. Brainstem and cerebellum are within normal limits. Vascular: Flow is present in the major intracranial arteries. The skullbase is within normal limits. Skull and upper cervical spine: The skullbase is within normal limits. The craniocervical junction is normal. Upper cervical spine is unremarkable. Sinuses/Orbits: The paranasal sinuses and mastoid air cells are clear. Globes and orbits are within normal limits. IMPRESSION: 1. Acute/subacute infarcts involving the high posterior right sylvian fissure, right frontal operculum, and right parietal white matter. This corresponds with the patient's left-sided hand and finger numbness. 2. Other periventricular and subcortical white matter changes bilaterally are moderately advanced for age and likely reflect the sequela of chronic microvascular ischemia. These results were called by telephone at the time of interpretation on 03/15/2017 at 11:10 am to [facility], NP, who verbally acknowledged these results. Electronically Signed   By: San Morelle M.D.   On: 03/15/2017 11:25   Scheduled Meds: .  stroke: mapping our early stages of recovery book   Does not apply Once  . atorvastatin  40 mg Oral q1800  . clopidogrel  75 mg Oral Daily  . DULoxetine  30 mg Oral BID  . losartan  100 mg Oral Daily  . multivitamin with minerals  1 tablet Oral Daily  . nicotine  21 mg Transdermal Once  . umeclidinium-vilanterol  1 puff Inhalation Daily   Continuous Infusions:   LOS: 0 days   Time spent: 25 minutes   Faye Ramsay, MD [facility] Pager [phone]  If 7PM-7AM, please contact  night-coverage [email or web address] Password [email or web address] 03/16/2017, 11:01 AM

## 2017-03-16 NOTE — H&P (View-Only) (Signed)
    [facility] has been requested to perform a transesophageal echocardiogram on 03/17/2017 for stoke.  After careful review of history and examination, the risks and benefits of transesophageal echocardiogram have been explained including risks of esophageal damage, perforation (1:10,000 risk), bleeding, pharyngeal hematoma as well as other potential complications associated with conscious sedation including aspiration, arrhythmia, respiratory failure and death. Alternatives to treatment were discussed, questions were answered. Patient is willing to proceed.   Rosaria Ferries, PA-C 03/16/2017 2:46 PM

## 2017-03-17 ENCOUNTER — Inpatient Hospital Stay (HOSPITAL_COMMUNITY): Payer: BLUE CROSS/BLUE SHIELD

## 2017-03-17 ENCOUNTER — Ambulatory Visit (HOSPITAL_COMMUNITY): Admit: 2017-03-17 | Payer: BLUE CROSS/BLUE SHIELD | Admitting: Internal Medicine

## 2017-03-17 ENCOUNTER — Encounter (HOSPITAL_COMMUNITY)
Admission: EM | Disposition: A | Discharge status: Home Health | Payer: Self-pay | Source: Home / Self Care | Attending: Internal Medicine

## 2017-03-17 ENCOUNTER — Encounter (HOSPITAL_COMMUNITY): Payer: Self-pay | Admitting: *Deleted

## 2017-03-17 DIAGNOSIS — I671 Cerebral aneurysm, nonruptured: Secondary | ICD-10-CM

## 2017-03-17 DIAGNOSIS — Z8673 Personal history of transient ischemic attack (TIA), and cerebral infarction without residual deficits: Secondary | ICD-10-CM

## 2017-03-17 DIAGNOSIS — I252 Old myocardial infarction: Secondary | ICD-10-CM

## 2017-03-17 DIAGNOSIS — I6389 Other cerebral infarction: Secondary | ICD-10-CM

## 2017-03-17 DIAGNOSIS — I340 Nonrheumatic mitral (valve) insufficiency: Secondary | ICD-10-CM

## 2017-03-17 HISTORY — PX: TEE WITHOUT CARDIOVERSION: SHX5443

## 2017-03-17 HISTORY — PX: LOOP RECORDER INSERTION: EP1214

## 2017-03-17 LAB — BASIC METABOLIC PANEL
Anion gap: 6 (ref 5–15)
BUN: 11 mg/dL (ref 6–20)
CHLORIDE: 108 mmol/L (ref 101–111)
CO2: 26 mmol/L (ref 22–32)
CREATININE: 0.57 mg/dL (ref 0.44–1.00)
Calcium: 8.9 mg/dL (ref 8.9–10.3)
GFR calc Af Amer: >60 mL/min (ref >60)
GFR calc non Af Amer: >60 mL/min (ref >60)
GLUCOSE: 90 mg/dL (ref 65–99)
POTASSIUM: 4 mmol/L (ref 3.5–5.1)
SODIUM: 140 mmol/L (ref 135–145)

## 2017-03-17 LAB — CBC
HCT: 42.6 % (ref 36.0–46.0)
HEMOGLOBIN: 13.7 g/dL (ref 12.0–15.0)
MCH: 29.8 pg (ref 26.0–34.0)
MCHC: 32.2 g/dL (ref 30.0–36.0)
MCV: 92.8 fL (ref 78.0–100.0)
Platelets: 226 10*3/uL (ref 150–400)
RBC: 4.59 MIL/uL (ref 3.87–5.11)
RDW: 14.1 % (ref 11.5–15.5)
WBC: 8.3 10*3/uL (ref 4.0–10.5)

## 2017-03-17 SURGERY — ECHOCARDIOGRAM, TRANSESOPHAGEAL
Anesthesia: Moderate Sedation

## 2017-03-17 SURGERY — LOOP RECORDER INSERTION

## 2017-03-17 MED ORDER — FENTANYL CITRATE (PF) 100 MCG/2ML IJ SOLN
INTRAMUSCULAR | Status: DC | PRN
  Administered 2017-03-17: 12.5 ug via INTRAVENOUS
  Administered 2017-03-17: 25 ug via INTRAVENOUS

## 2017-03-17 MED ORDER — BUTAMBEN-TETRACAINE-BENZOCAINE 2-2-14 % EX AERO
INHALATION_SPRAY | CUTANEOUS | Status: DC | PRN
  Administered 2017-03-17: 1 via TOPICAL

## 2017-03-17 MED ORDER — ATORVASTATIN CALCIUM 40 MG PO TABS
40.0000 mg | ORAL_TABLET | Freq: Every day | ORAL | 0 refills | Status: DC
Start: 2017-03-17 — End: 2017-12-31

## 2017-03-17 MED ORDER — MIDAZOLAM HCL 10 MG/2ML IJ SOLN
INTRAMUSCULAR | Status: DC | PRN
  Administered 2017-03-17 (×2): 2 mg via INTRAVENOUS

## 2017-03-17 MED ORDER — ONDANSETRON HCL 4 MG/2ML IJ SOLN: 4.0000 mg | Freq: Four times a day (QID) | INTRAMUSCULAR | Status: DC | PRN

## 2017-03-17 MED ORDER — TRAZODONE HCL 50 MG PO TABS: 50.0000 mg | ORAL_TABLET | Freq: Every evening | ORAL | 0 refills | Status: DC | PRN

## 2017-03-17 MED ORDER — SODIUM CHLORIDE 0.9 % IV SOLN: INTRAVENOUS | Status: DC

## 2017-03-17 MED ORDER — LIDOCAINE-EPINEPHRINE 1 %-1:100000 IJ SOLN
INTRAMUSCULAR | Status: AC
  Filled 2017-03-17: qty 1

## 2017-03-17 MED ORDER — ACETAMINOPHEN 325 MG PO TABS: 325.0000 mg | ORAL_TABLET | ORAL | Status: DC | PRN

## 2017-03-17 MED ORDER — FENTANYL CITRATE (PF) 100 MCG/2ML IJ SOLN
INTRAMUSCULAR | Status: AC
  Filled 2017-03-17: qty 2

## 2017-03-17 MED ORDER — MIDAZOLAM HCL 5 MG/ML IJ SOLN
INTRAMUSCULAR | Status: AC
  Filled 2017-03-17: qty 2

## 2017-03-17 MED ORDER — LIDOCAINE-EPINEPHRINE 1 %-1:100000 IJ SOLN
INTRAMUSCULAR | Status: DC | PRN
  Administered 2017-03-17: 20 mL via INTRADERMAL

## 2017-03-17 MED ORDER — CLOPIDOGREL BISULFATE 75 MG PO TABS: 75.0000 mg | ORAL_TABLET | Freq: Every day | ORAL | 0 refills | Status: DC

## 2017-03-17 SURGICAL SUPPLY — 2 items
LOOP REVEAL LINQSYS (Prosthesis & Implant Heart) ×3 IMPLANT
PACK LOOP INSERTION (CUSTOM PROCEDURE TRAY) ×3 IMPLANT

## 2017-03-17 NOTE — Consult Note (Signed)
ELECTROPHYSIOLOGY CONSULT NOTE  Patient ID: Heidi Foster MRN: [id number], DOB/AGE: 11/23/1955   Admit date: 03/15/2017 Date of Consult: 03/17/2017  Primary Physician: Esaw Grandchild, NP Primary Cardiologist: Dr. Percival Spanish Reason for Consultation: Cryptogenic stroke ; recommendations regarding Implantable Loop Recorder, requested by Dr. Erlinda Hong  History of Present Illness Heidi Foster was admitted on 03/15/2017 with CVA.  They first developed symptoms while at home, R hand numbness and transient visual loss, saw her PMD who did MRI and referred her to the ER with abnormal result.  Imaging demonstrated  ACUTE & SUBACUTE RIGHT MCA STROKES: within right frontal operculum, posterior right sylvian fissure,  and right parietal white matter. PMHx includes TIA, HTN, reports of remote MI, COPD, active smoker.   she has undergone workup for stroke including echocardiogram and carotid angio.  The patient has been monitored on telemetry which has demonstrated sinus rhythm with no arrhythmias.  Inpatient stroke work-up is to be completed with a TEE.   Echocardiogram this admission demonstrated Study Conclusions - Left ventricle: The cavity size was normal. Systolic function was   vigorous. The estimated ejection fraction was in the range of 65%   to 70%. Wall motion was normal; there were no regional wall   motion abnormalities. Doppler parameters are consistent with   abnormal left ventricular relaxation (grade 1 diastolic   dysfunction). - Left atrium: The atrium was mildly dilated.   Lab work is reviewed.  Prior to admission, the patient reports fleeting episodes of chest tightness that is described as chronic and unchanged in behavior for years, no shortness of breath, dizziness, palpitations, or syncope.  She reports no ongoing symptoms from her stroke with plans to home at discharge, today as far as she has been told after testing today is completed.    Past Medical History:  Diagnosis Date  .  Allergy   . Anginal pain ([place])   . Arthritis   . Cancer ([place])    skin on nose  . Closed fracture of right patella   . Emphysema of lung ([place])   . GERD (gastroesophageal reflux disease)   . Heart attack ([place]) 1999  . Hypertension   . Osteopenia 12/2016   T score -1.2 FRAX 7.4%/0.8%  . Seborrheic keratoses   . Stroke ([place])    TIA x 2, early 2000's   . Vocal cord polyp      Surgical History:  Past Surgical History:  Procedure Laterality Date  . BREAST BIOPSY    . BREAST SURGERY     Left Breast nodule markers  . CARDIAC CATHETERIZATION     1999  . caridac cath    . lt. ovary removed    . MICROLARYNGOSCOPY Bilateral 11/13/2016   Performed by Melissa Montane, MD at [facility] OR  . polyp removal  10/2016   throat -   . SKIN BIOPSY     nose  . TUBAL LIGATION       Medications Prior to Admission  Medication Sig Dispense Refill Last Dose  . albuterol (PROVENTIL HFA;VENTOLIN HFA) 108 (90 Base) MCG/ACT inhaler Inhale 2 puffs into the lungs every 4 (four) hours as needed for wheezing or shortness of breath. 1 Inhaler 3 couple days ago  . amLODipine (NORVASC) 2.5 MG tablet Take 1 tablet (2.5 mg total) by mouth daily. Stop taking Amlodipine 10mg . 90 tablet 2 03/15/2017 at am  . aspirin EC 81 MG tablet Take 81 mg by mouth daily.   03/15/2017 at am  . aspirin-acetaminophen-caffeine [facility]  MIGRAINE) 250-250-65 MG tablet Take 1 tablet by mouth daily as needed for headache.   few days ago  . DULoxetine (CYMBALTA) 30 MG capsule 1 tablet daily for first week, then increase to two tabs once daily. (Patient taking differently: Take 30 mg 2 (two) times daily by mouth. ) 180 capsule 2 03/15/2017 at am  . losartan (COZAAR) 100 MG tablet TAKE ONE TABLET BY MOUTH ONCE DAILY (Patient taking differently: TAKE ONE TABLET (100 MG)  BY MOUTH ONCE DAILY) 90 tablet 1 03/15/2017 at am  . Multiple Vitamin (MULTIVITAMIN WITH MINERALS) TABS tablet Take 1 tablet daily by mouth.   03/15/2017 at am  .  umeclidinium-vilanterol (ANORO ELLIPTA) 62.5-25 MCG/INH AEPB Inhale 1 puff daily into the lungs.   03/15/2017 at am  . verapamil (VERELAN PM) 180 MG 24 hr capsule TAKE ONE TABLET BY MOUTH ONCE DAILY (Patient taking differently: TAKE ONE TABLET (180 MG) BY MOUTH ONCE DAILY) 90 capsule 1 03/15/2017 at am  . ALPRAZolam (XANAX) 0.5 MG tablet Take 1 tablet (0.5 mg total) 2 (two) times daily as needed by mouth for anxiety. (Patient not taking: Reported on 03/15/2017) 8 tablet 0 Not Taking at Unknown time  . Spacer/Aero-Holding Chambers (AEROCHAMBER MV) inhaler Use as instructed 1 each 0 Taking    Inpatient Medications:  . atorvastatin  40 mg Oral q1800  . clopidogrel  75 mg Oral Daily  . DULoxetine  30 mg Oral BID  . losartan  100 mg Oral Daily  . multivitamin with minerals  1 tablet Oral Daily  . umeclidinium-vilanterol  1 puff Inhalation Daily    Allergies:  Allergies  Allergen Reactions  . Codeine Anaphylaxis  . Bupropion Other (See Comments)    Anger and mental issues    Social History   Socioeconomic History  . Marital status: Divorced    Spouse name: Not on file  . Number of children: Not on file  . Years of education: Not on file  . Highest education level: Not on file  Social Needs  . Financial resource strain: Not on file  . Food insecurity - worry: Not on file  . Food insecurity - inability: Not on file  . Transportation needs - medical: Not on file  . Transportation needs - non-medical: Not on file  Occupational History  . Not on file  Tobacco Use  . Smoking status: Current Every Day Smoker    Packs/day: 0.50    Years: 40.00    Pack years: 20.00    Types: Cigarettes    Start date: 09/27/1976  . Smokeless tobacco: Never Used  . Tobacco comment: Peak rate of 1ppd  Substance and Sexual Activity  . Alcohol use: No  . Drug use: No  . Sexual activity: No  Other Topics Concern  . Not on file  Social History Narrative   [facility] (06/30/16):   Originally from  [place]. Moved to [facility] in April 2016. 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 Currently lives with her daughter & son-in-law in a mobile home. Has a cat currently. No mold or TB exposure. Always had a negative skin PPD. Previously enjoyed crocheting.      Family History  Problem Relation Age of Onset  . Diabetes Mother   . Hypertension Mother   . Stroke Mother   . Alcohol abuse Father   . Multiple sclerosis Sister   . Cancer Brother        lung  .  Stroke Maternal Grandmother   . Heart attack Maternal Grandmother   . Hypertension Maternal Grandmother   . Colon cancer Neg Hx   . Colon polyps Neg Hx   . Esophageal cancer Neg Hx   . Stomach cancer Neg Hx   . Rectal cancer Neg Hx       Review of Systems: All other systems reviewed and are otherwise negative except as noted above.  Physical Exam: Vitals:   03/16/17 1719 03/16/17 2134 03/17/17 0125 03/17/17 0616  BP: 122/64 140/82 136/72 (!) 145/64  Pulse: 74 77 69 68  Resp: 20 20 18 18   Temp: 98 F (36.7 C) 98.4 F (36.9 C) 98.5 F (36.9 C) 98.3 F (36.8 C)  TempSrc: Oral Oral Oral Oral  SpO2: 97% 97% 95% 96%  Weight:      Height:        GEN- The patient is well appearing, alert and oriented x 3 today.   Head- normocephalic, atraumatic Eyes-  Sclera clear, conjunctiva pink Ears- hearing intact Oropharynx- clear Neck- supple Lungs- CTA b/l, normal work of breathing Heart-RRR, no murmurs, rubs or gallops  GI- soft, NT, ND Extremities- no clubbing, cyanosis, or edema MS- no significant deformity or atrophy Skin- no rash or lesion Psych- euthymic mood, full affect   Labs:   Lab Results  Component Value Date   WBC 8.3 03/17/2017   HGB 13.7 03/17/2017   HCT 42.6 03/17/2017   MCV 92.8 03/17/2017   PLT 226 03/17/2017    Recent Labs  Lab 03/15/17 1659  03/17/17 0450  NA 140   < > 140  K 3.4*   < > 4.0  CL 109   < > 108  CO2 25  --  26  BUN 9   < > 11  CREATININE 0.66   < > 0.57    CALCIUM 9.3  --  8.9  PROT 6.1*  --   --   BILITOT 0.2*  --   --   ALKPHOS 107  --   --   ALT 15  --   --   AST 22  --   --   GLUCOSE 103*   < > 90   < > = values in this interval not displayed.   No results found for: CKTOTAL, CKMB, CKMBINDEX, TROPONINI Lab Results  Component Value Date   CHOL 181 03/16/2017   CHOL 179 05/26/2016   Lab Results  Component Value Date   HDL 39 (L) 03/16/2017   HDL 48 05/26/2016   Lab Results  Component Value Date   LDLCALC 92 03/16/2017   LDLCALC 111 (H) 05/26/2016   Lab Results  Component Value Date   TRIG 251 (H) 03/16/2017   TRIG 99 05/26/2016   Lab Results  Component Value Date   CHOLHDL 4.6 03/16/2017   CHOLHDL 3.7 05/26/2016   No results found for: LDLDIRECT  No results found for: DDIMER   Radiology/Studies:   Ct Angio Head W Or Wo Contrast Result Date: 03/16/2017 CLINICAL DATA:  61 y/o  F; follow-up of stroke. EXAM: CT ANGIOGRAPHY HEAD AND NECK TECHNIQUE: Multidetector CT imaging of the head and neck was performed using the standard protocol during bolus administration of intravenous contrast. Multiplanar CT image reconstructions and MIPs were obtained to evaluate the vascular anatomy. Carotid stenosis measurements (when applicable) are obtained utilizing NASCET criteria, using the distal internal carotid diameter as the denominator. CONTRAST:  68mL ISOVUE-370 IOPAMIDOL (ISOVUE-370) INJECTION 76% COMPARISON:  03/15/2017 MRI of the  brain FINDINGS: CT HEAD FINDINGS Brain: Small areas of hypoattenuation within the right frontal operculum and right parietal white matter corresponding to regions of acute infarction on prior MRI. No findings of interval acute infarct, hemorrhage, or focal mass effect. Small chronic lacunar infarct within the right mid putamen. Stable chronic microvascular ischemic changes and parenchymal volume loss of the brain. Vascular: Small branching linear densities are present within the right posterior sylvian  fissure (series 3 image 16 and 17). Skull: Normal. Negative for fracture or focal lesion. Sinuses: Imaged portions are clear. Orbits: No acute finding. Review of the MIP images confirms the above findings CTA NECK FINDINGS Aortic arch: Standard branching. Imaged portion shows no evidence of aneurysm or dissection. Moderate calcific atherosclerosis of the aortic arch and great vessel origins with mild less than 50% stenosis of left common carotid artery and left subclavian artery origins. Right carotid system: No evidence of dissection, stenosis (50% or greater) or occlusion. Moderate calcified plaque of the carotid bifurcation with mild less than 50% proximal ICA stenosis. Left carotid system: No evidence of dissection, stenosis (50% or greater) or occlusion. Moderate calcified plaque of the carotid bifurcation with mild less than 50% proximal ICA stenosis. Vertebral arteries: Codominant. No evidence of dissection, stenosis (50% or greater) or occlusion. Skeleton: Odontogenic disease with dental caries and periapical cyst involving the bilateral maxillary molars. Moderate cervical spondylosis with multilevel disc and facet degenerative changes. Uncovertebral and facet hypertrophy encroaches on the neural foramen greatest at the left C3-4 and right C5-C7 levels. No high-grade bony canal stenosis. Other neck: Negative. Upper chest: Negative. Review of the MIP images confirms the above findings CTA HEAD FINDINGS Anterior circulation: Small branching linear densities on noncontrast CT of head in the right posterior sylvian fissure correspond with small branch vessel thromboses of right distal MCA superior division (series 10, image 76 and 74 and series 13, image 13). 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 3.5 x 4.3 x 2.7 mm aneurysm of the origin of left MCA superior division (series 10, image 95 and series 11,  image 84). Mild calcific atherosclerosis of carotid siphons without significant stenosis. Otherwise no large vessel occlusion, aneurysm, or significant stenosis is identified. Posterior circulation: No significant stenosis, proximal occlusion, aneurysm, or vascular malformation. Venous sinuses: As permitted by contrast timing, patent. Anatomic variants: Diminutive bilateral posterior communicating arteries and small anterior communicating artery. Delayed phase: No abnormal intracranial enhancement. Review of the MIP images confirms the above findings IMPRESSION: CT head: Acute infarctions are present within right frontal operculum and right parietal white matter as seen on prior MRI. No acute hemorrhage. No new acute intracranial abnormality. CTA neck: 1. Moderate calcific atherosclerosis of aortic arch, great vessel origins, and bilateral carotid bifurcations. 2. No evidence of dissection, hemodynamically significant stenosis by NASCET criteria, occlusion, or aneurysm. 3. Moderate cervical spine degenerative changes. 4. Dental disease with periapical cysts and dental caries greatest in maxillary molars. CTA head: 1. Small M3 branch vessel thromboses of the right distal MCA superior division in the region of acute infarctions. 2. 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 Early branching right MCA. 3. 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 4. Otherwise no additional intracranial large vessel occlusion, aneurysm, or significant stenosis. These results will be called to the ordering clinician or representative by the  Psychologist, clinical, and communication documented in the PACS or zVision Dashboard. Electronically Signed   By: Kristine Garbe M.D.   On: 03/16/2017 00:53     Mr Brain Wo Contrast Result Date: 03/15/2017 CLINICAL DATA:  Paresthesias in both hands. EXAM: MRI HEAD WITHOUT CONTRAST TECHNIQUE: Multiplanar, multiecho pulse sequences of the  brain and surrounding structures were obtained without intravenous contrast. COMPARISON:  CT head without contrast 04/26/2015. FINDINGS: Brain: Restricted diffusion and associated T2 signal changes are present along the superior aspect the right sylvian fissure, inferior right frontal operculum, and right parietal white matter Additional periventricular and subcortical T2 changes are present bilaterally. A remote lacunar infarct is present within the right external capsule. The internal auditory canals are normal bilaterally. Brainstem and cerebellum are within normal limits. Vascular: Flow is present in the major intracranial arteries. The skullbase is within normal limits. Skull and upper cervical spine: The skullbase is within normal limits. The craniocervical junction is normal. Upper cervical spine is unremarkable. Sinuses/Orbits: The paranasal sinuses and mastoid air cells are clear. Globes and orbits are within normal limits. IMPRESSION: 1. Acute/subacute infarcts involving the high posterior right sylvian fissure, right frontal operculum, and right parietal white matter. This corresponds with the patient's left-sided hand and finger numbness. 2. Other periventricular and subcortical white matter changes bilaterally are moderately advanced for age and likely reflect the sequela of chronic microvascular ischemia. These results were called by telephone at the time of interpretation on 03/15/2017 at 11:10 am to [facility], NP, who verbally acknowledged these results. Electronically Signed   By: San Morelle M.D.   On: 03/15/2017 11:25    12-lead ECG SR All prior EKG's in EPIC reviewed with no documented atrial fibrillation  Telemetry SR  Assessment and Plan:  1. Cryptogenic stroke The patient presents with cryptogenic stroke.  The patient has a TEE planned for this AM.  I spoke at length with the patient about monitoring for afib with either a 30 day event monitor or an implantable loop  recorder.  Risks, benefits, and alteratives to implantable loop recorder were discussed with the patient today.   At this time, the patient is very clear in her decision to proceed with implantable loop recorder.   Wound care was reviewed with the patient (keep incision clean and dry for 3 days).  Wound check will be scheduled for the patient  Please call with questions.   Renee Dyane Dustman, PA-C 03/17/2017  EP Attending  Patient seen and examined. Agree with above. She has had a cryptogenic stroke. TEE with no evidence of cause of her stroke. Will plan to proceed with insertion of an ILR.   Mikle Bosworth.D.

## 2017-03-17 NOTE — Progress Notes (Signed)
Pt given discharge instructions and taken down via wheelchair. Pt has all of belongings.

## 2017-03-17 NOTE — Progress Notes (Signed)
STROKE TEAM PROGRESS NOTE   SUBJECTIVE (INTERVAL HISTORY) No family is at the bedside.  Patient is found lying in bed in no acute distress. Overall she feels her condition is completely resolved. Voices no new complaints. No new events reported overnight. Requesting to go home.  OBJECTIVE Lab Results: CBC:  Recent Labs  Lab 03/15/17 1659 03/15/17 1711 03/17/17 0450  WBC 8.8  --  8.3  HGB 14.6 15.0 13.7  HCT 43.9 44.0 42.6  MCV 92.2  --  92.8  PLT 262  --  226   BMP: Recent Labs  Lab 03/15/17 1659 03/15/17 1711 03/17/17 0450  NA 140 141 140  K 3.4* 3.3* 4.0  CL 109 106 108  CO2 25  --  26  GLUCOSE 103* 113* 90  BUN 9 11 11   CREATININE 0.66 0.70 0.57  CALCIUM 9.3  --  8.9   PHYSICAL EXAM Temp:  [98 F (36.7 C)-98.5 F (36.9 C)] 98.2 F (36.8 C) (11/20 0836) Pulse Rate:  [63-77] 65 (11/20 0845) Resp:  [14-22] 19 (11/20 0845) BP: (122-174)/(64-100) 135/73 (11/20 0845) SpO2:  [93 %-98 %] 97 % (11/20 0845) Weight:  [60.8 kg (134 lb)] 60.8 kg (134 lb) (11/20 0727) General - Well nourished, well developed, in no apparent distress Respiratory - Lungs clear bilaterally. No wheezing. Cardiovascular - Regular rate and rhythm   NEURO:  Mental Status: AA&Ox3  Language: speech is clear.  Naming, repetition, fluency, and comprehension intact. Cranial Nerves: PERRL . EOMI, visual fields full, no facial asymmetry,facial sensation intact, hearing intact, tongue/uvula/soft palate midline, normal sternocleidomastoid and trapezius muscle strength. No evidence of tongue atrophy or fibrillations Motor: 5/5 all over.  Normal tone.  Normal range of motion. Tone: is normal and bulk is normal Sensation- Intact to light touch bilaterally Coordination: FTN intact bilaterally, no ataxia in BLE Gait- deferred  IMAGING: I have personally reviewed the radiological images below and agree with the radiology interpretations.  Ct Angio Head & Neck W Or Wo Contrast Result Date:  03/16/2017 IMPRESSION: CT head: Acute infarctions are present within right frontal operculum and right parietal white matter as seen on prior MRI. No acute hemorrhage. No new acute intracranial abnormality. CTA neck: 1. Moderate calcific atherosclerosis of aortic arch, great vessel origins, and bilateral carotid bifurcations. 2. No evidence of dissection, hemodynamically significant stenosis by NASCET criteria, occlusion, or aneurysm. 3. Moderate cervical spine degenerative changes. 4. Dental disease with periapical cysts and dental caries greatest in maxillary molars. CTA head: 1. Small M3 branch vessel thromboses of the right distal MCA superior division in the region of acute infarctions. 2. 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 Early branching right MCA. 3. 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 4. Otherwise no additional intracranial large vessel occlusion, aneurysm, or significant stenosis.  Mr Brain Wo Contrast Result Date: 03/15/2017 IMPRESSION: 1. Acute/subacute infarcts involving the high posterior right sylvian fissure, right frontal operculum, and right parietal white matter. This corresponds with the patient's left-sided hand and finger numbness. 2. Other periventricular and subcortical white matter changes bilaterally are moderately advanced for age and likely reflect the sequela of chronic microvascular ischemia.   Echocardiogram:  Study Conclusions - Left ventricle: The cavity size was normal. Systolic function was   vigorous. The estimated ejection fraction was in the range of 65%   to 70%. Wall motion was normal; there were no regional wall   motion abnormalities. Doppler parameters are consistent with  abnormal left ventricular relaxation (grade 1 diastolic   dysfunction). - Left atrium: The atrium was mildly dilated.  TEE- No intracardiac source of embolism. No PFO Negative bubble study.   B/L LE Duplex:  PENDING _____________________________________________________________________ ASSESSMENT: [name] is a 61 y.o. female with PMH of hypertension, COPD, tobacco abuse, history of TIAs in the past, history of an MI status post cardiac catheterization with no stents, fibromyalgia, who was in her usual state of health until 03/05/2017 when she noted that her hands felt tingly-pins and needles.  ACUTE & SUBACUTE RIGHT MCA STROKES: within right frontal operculum, posterior right sylvian fissure,  and right parietal white matter Suspected Etiology: Likely cardioembolic versus atheroembolic.  Atypical location for small vessel disease Resultant Symptoms: paresthesias in her hands on 03/05/2017 that completely resolved followed by some visual symptoms of seeing floaters which also completely resolved  Stroke Risk Factors: hypertension, smoking and Hx TIA's, MI in the past Other Stroke Risk Factors: Advanced age, Family Hx Stroke  03/17/19: Pt admitted for episode of b/l hand tingling on 03/05/17 and floater in vision 03/10/17, both lasted about one day and resolved. MRI right MCA frontal subcortical infarct. CTA h/n right M3 thrombosis as well as b/l M2 aneurysms 4.7/4.53mm, and atherosclerosis b/l siphon, proximal ICA. LDL 92 and A1C 5.6. Change ASA to plavix and put on lipitor for stroke prevention. Recommend smoking cessation. Due to concern of cardioembolic stroke, will do TEE and loop. Her cerebral aneurysms needs outpt follow up and referral to IR.   03/17/17: TEE Negative. Loop Recorder placed. LE Dopplers pending. Low suspicion for DVT. 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 Will need outpatient referral to IR for follow up  R/O AFIB, CHRONIC: TEE- Negative Loop Recorder Placed 03/17/17  HYPERTENSION: Stable Permissive hypertension (OK if <220/120) for 24-48 hours post stroke and then gradually normalized within 5-7 days. Long term BP goal normotensive. May slowly restart home B/P medications after 48 hours Home Meds: Norvasc, Cozaar, Verapamil  HYPERLIPIDEMIA:    Component Value Date/Time   CHOL 181 03/16/2017 0345   CHOL 179 05/26/2016 1135   TRIG 251 (H) 03/16/2017 0345   HDL 39 (L) 03/16/2017 0345   HDL 48 05/26/2016 1135   CHOLHDL 4.6 03/16/2017 0345   VLDL 50 (H) 03/16/2017 0345   LDLCALC 92 03/16/2017 0345   LDLCALC 111 (H) 05/26/2016 1135  Home Meds:  NONE LDL  goal < 70 Started on Lipitor to 40 mg daily Continue statin at discharge, PCP to monitor need to start Fenofibrate  R/O DIABETES: Lab Results  Component Value Date   HGBA1C 5.6 03/16/2017  HgbA1c goal < 7.0  TOBACCO ABUSE Current smoker Smoking cessation counseling provided Nicotine patch provided  Other Active Problems: Principal Problem:   Stroke [facility]) Active Problems:   Hypertension   Tobacco use disorder   History of MI (myocardial infarction)   History of TIA (transient ischemic attack)   GERD (gastroesophageal reflux disease)   Centrilobular emphysema ([place])   Fibromyalgia  Hospital day # 1  VTE prophylaxis: SCD's  Diet : Diet Heart Room  service appropriate? Yes; Fluid consistency: Thin  Hospital Stroke Medications:Plavix 75 mg and Lipitor 40 mg Home Stroke Medications: ASA 81 mg Stroke Meds Plan: aspirin 81 mg daily Prior to Admission,  Now on clopidogrel 75 mg daily  Discharge Stroke Meds: Please discharge patient on clopidogrel 75 mg daily   Disposition: 01-Home or Self Care Therapy Recs:  NONE Follow Recs: Neurology - Cecille Rubin,  NP Follow-up Information    Dennie Bible, NP. Schedule an appointment as soon as possible for a visit in 6 week(s).   Specialty:  Family Medicine Contact information: [address] [place] [address] [phone] Follow up with [facility] in 6 weeks       [place] [facility] Follow up on 03/31/2017.   Specialty:  Cardiology Why:  4:00PM, wound check visit Contact information: [address], [place] [address] [phone]         FAMILY UPDATES: No family at [place], [place] Stroke Neurology Team 03/17/2017 11:44 AM    Attending note:  I reviewed above note and agree with the assessment and plan. I have made any additions or clarifications directly to the above note. Pt was seen and examined. Pt admitted for episode of b/l hand tingling on 03/05/17 and floater in vision 03/10/17, both lasted about one day and resolved. MRI right MCA frontal subcortical infarct. CTA h/n right M3 thrombosis as well as b/l M2 aneurysms 4.7/4.37mm, and atherosclerosis b/l siphon, proximal ICA. LDL 92 and A1C 5.6. Change ASA to plavix and put on lipitor for stroke prevention. Recommend smoking cessation. Due to concern of cardioembolic stroke, had TEE which is negative and loop recorder placed. Her cerebral aneurysms needs outpt follow up and referral to IR.   Neurology will sign off. Please call with questions. Pt will follow up with Dr. Erlinda Hong at [facility] in about 6 weeks. Thanks for the consult.  Rosalin Hawking, MD PhD Stroke Neurology 03/17/2017 10:01 PM   To contact Stroke Continuity provider, please refer to [email or web address] After hours, contact General Neurology

## 2017-03-17 NOTE — Discharge Instructions (Signed)
Wound care instructions Keep incision clean and dry for 3 days. You can remove outer dressing tomorrow. Leave steri-strips (little pieces of tape) on until seen in the office for wound check appointment. Call the office ([phone]) for redness, drainage, swelling, or fever.  

## 2017-03-17 NOTE — Evaluation (Signed)
SLP Cancellation Note  Patient Details Name: Heidi Foster MRN: [id number] DOB: [birth year]   Cancelled treatment:       Reason Eval/Treat Not Completed: Other (comment)(pt at TEE, will continue efforts)   Claudie Fisherman, [place] [facility] SLP [phone]

## 2017-03-17 NOTE — CV Procedure (Signed)
     Transesophageal Echocardiogram Note  Heidi Foster [id number] [birth year]  Procedure: Transesophageal Echocardiogram Indications: Stroke  Procedure Details Consent: Obtained Time Out: Verified patient identification, verified procedure, site/side was marked, verified correct patient position, special equipment/implants available, Radiology Safety Procedures followed,  medications/allergies/relevent history reviewed, required imaging and test results available.  Performed  Medications:  During this procedure the patient is administered a total of Versed 4 mg and Fentanyl 37.5 mg to achieve and maintain moderate conscious sedation.  The patient's heart rate, blood pressure, and oxygen saturation are monitored continuously during the procedure. The period of conscious sedation is 30 minutes, of which I was present face-to-face 100% of this time.  No intracardiac source of embolism. No PFO Negative bubble study.    Complications: No apparent complications Patient did tolerate procedure well.  [name], MD, [facility] 03/17/2017, 8:31 AM

## 2017-03-17 NOTE — Care Management (Signed)
Pt discharging home today. Pt has transportation. Cory with [facility] notified of d/c. No further needs per CM.

## 2017-03-17 NOTE — Progress Notes (Signed)
PT Cancellation Note  Patient Details Name: Heidi Foster MRN: [id number] DOB: [birth year]   Cancelled Treatment:    Reason Eval/Treat Not Completed: Patient at procedure or test/unavailable Pt off floor for TEE. Will follow up.   Marguarite Arbour A Jakeim Sedore 03/17/2017, 8:03 AM Wray Kearns, PT, DPT [phone]

## 2017-03-17 NOTE — Progress Notes (Signed)
  Echocardiogram Echocardiogram Transesophageal has been performed.  Heidi Foster 03/17/2017, 8:33 AM

## 2017-03-17 NOTE — Discharge Summary (Signed)
Physician Discharge Summary  Heidi Foster [name] DOB: [birth year] DOA: 03/15/2017  PCP: Heidi Grandchild, NP  Admit date: 03/15/2017 Discharge date: 03/17/2017  Recommendations for Outpatient Follow-up:  1. Pt will need to follow up with PCP in 1-2 weeks post discharge 2. Please obtain BMP to evaluate electrolytes and kidney function 3. Please also check CBC to evaluate Hg and Hct levels 4. Please note that per neurology team recommendations, aspirin has been changed to Plavix and pt was made aware of the change 5. Also TEE done and with no evidence of cause of the cryptogenic stroke, pt had Implantable Loop Recorder (ILR) inserted and will follow up with cardiologist in an outpatient setting   Discharge Diagnoses:  Principal Problem:   Stroke [facility]) Active Problems:   Hypertension  Discharge Condition: Stable  Diet recommendation: Heart healthy diet discussed in details   History of present illness:  Pt is 61 yo female with known COPD and ongoing tobacco use, COPD, previous TIA's, presented to [facility] ED with main concern of bilateral hands tingling and floaters in vision. She has seen doctor for this and MRI was ordered, revealed R MCA territory infarct and pt was sent to ED for further evaluation.   Assessment & Plan:   Principal Problem:   Stroke [facility]), acute ischemic stroke in the right cerebral hemisphere - acute and subacute right MCA strokes within right frontal operculum, posterior right sylvian fissure, right parietal white matter - likely cardioembolic  - resultant symptoms: parasthesias in hand that has now resolved, visual floaters that have by now resolved as well  - pt with multiple stroke risk factors: HTN, smoking, previous TIA's - TEE done and with no evidence of the cause of the cryptogenic stroke - pt was made aware that Aspirin was changed to Plavix - pt was also started on Statin  - ILR inserted     HLD - statin started on discharge   Active Problems:    Hypertension, essential - BP has remained stable  - continue home regimen with Amlodipine, Losartan, Verapamil     Tobacco use disorder, COPD - counseled on cessation     Hypokalemia - supplemented   DVT prophylaxis: SCD's, Plavix  Code Status: Full  Family Communication: Patient at bedside  Disposition Plan: home   Consultants:   Neurology, stroke team   Procedures:   ILR placed by cardiology team   Antimicrobials:   None  Procedures/Studies: Ct Angio Head W Or Wo Contrast  Result Date: 03/16/2017 CLINICAL DATA:  61 y/o  F; follow-up of stroke. EXAM: CT ANGIOGRAPHY HEAD AND NECK TECHNIQUE: Multidetector CT imaging of the head and neck was performed using the standard protocol during bolus administration of intravenous contrast. Multiplanar CT image reconstructions and MIPs were obtained to evaluate the vascular anatomy. Carotid stenosis measurements (when applicable) are obtained utilizing NASCET criteria, using the distal internal carotid diameter as the denominator. CONTRAST:  32mL ISOVUE-370 IOPAMIDOL (ISOVUE-370) INJECTION 76% COMPARISON:  03/15/2017 MRI of the brain FINDINGS: CT HEAD FINDINGS Brain: Small areas of hypoattenuation within the right frontal operculum and right parietal white matter corresponding to regions of acute infarction on prior MRI. No findings of interval acute infarct, hemorrhage, or focal mass effect. Small chronic lacunar infarct within the right mid putamen. Stable chronic microvascular ischemic changes and parenchymal volume loss of the brain. Vascular: Small branching linear densities are present within the right posterior sylvian fissure (series 3 image 16 and 17). Skull: Normal. Negative for fracture or focal lesion. Sinuses:  Imaged portions are clear. Orbits: No acute finding. Review of the MIP images confirms the above findings CTA NECK FINDINGS Aortic arch: Standard branching. Imaged portion shows no evidence of aneurysm or dissection.  Moderate calcific atherosclerosis of the aortic arch and great vessel origins with mild less than 50% stenosis of left common carotid artery and left subclavian artery origins. Right carotid system: No evidence of dissection, stenosis (50% or greater) or occlusion. Moderate calcified plaque of the carotid bifurcation with mild less than 50% proximal ICA stenosis. Left carotid system: No evidence of dissection, stenosis (50% or greater) or occlusion. Moderate calcified plaque of the carotid bifurcation with mild less than 50% proximal ICA stenosis. Vertebral arteries: Codominant. No evidence of dissection, stenosis (50% or greater) or occlusion. Skeleton: Odontogenic disease with dental caries and periapical cyst involving the bilateral maxillary molars. Moderate cervical spondylosis with multilevel disc and facet degenerative changes. Uncovertebral and facet hypertrophy encroaches on the neural foramen greatest at the left C3-4 and right C5-C7 levels. No high-grade bony canal stenosis. Other neck: Negative. Upper chest: Negative. Review of the MIP images confirms the above findings CTA HEAD FINDINGS Anterior circulation: Small branching linear densities on noncontrast CT of head in the right posterior sylvian fissure correspond with small branch vessel thromboses of right distal MCA superior division (series 10, image 76 and 74 and series 13, image 13). 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 3.5 x 4.3 x 2.7 mm aneurysm of the origin of left MCA superior division (series 10, image 95 and series 11, image 84). Mild calcific atherosclerosis of carotid siphons without significant stenosis. Otherwise no large vessel occlusion, aneurysm, or significant stenosis is identified. Posterior circulation: No significant stenosis, proximal occlusion, aneurysm, or vascular malformation. Venous sinuses: As permitted by  contrast timing, patent. Anatomic variants: Diminutive bilateral posterior communicating arteries and small anterior communicating artery. Delayed phase: No abnormal intracranial enhancement. Review of the MIP images confirms the above findings IMPRESSION: CT head: Acute infarctions are present within right frontal operculum and right parietal white matter as seen on prior MRI. No acute hemorrhage. No new acute intracranial abnormality. CTA neck: 1. Moderate calcific atherosclerosis of aortic arch, great vessel origins, and bilateral carotid bifurcations. 2. No evidence of dissection, hemodynamically significant stenosis by NASCET criteria, occlusion, or aneurysm. 3. Moderate cervical spine degenerative changes. 4. Dental disease with periapical cysts and dental caries greatest in maxillary molars. CTA head: 1. Small M3 branch vessel thromboses of the right distal MCA superior division in the region of acute infarctions. 2. 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 Early branching right MCA. 3. 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 4. Otherwise no additional intracranial large vessel occlusion, aneurysm, or significant stenosis. These results will be called to the ordering clinician or representative by the Radiologist Assistant, and communication documented in the PACS or zVision Dashboard. Electronically Signed   By: Kristine Garbe M.D.   On: 03/16/2017 00:53   Ct Angio Neck W Or Wo Contrast  Result Date: 03/16/2017 CLINICAL DATA:  61 y/o  F; follow-up of stroke. EXAM: CT ANGIOGRAPHY HEAD AND NECK TECHNIQUE: Multidetector CT imaging of the head and neck was performed using the standard protocol during bolus administration of intravenous contrast. Multiplanar CT image reconstructions and MIPs were obtained to evaluate the vascular anatomy. Carotid  stenosis measurements (when applicable) are obtained utilizing NASCET criteria, using the distal  internal carotid diameter as the denominator. CONTRAST:  57mL ISOVUE-370 IOPAMIDOL (ISOVUE-370) INJECTION 76% COMPARISON:  03/15/2017 MRI of the brain FINDINGS: CT HEAD FINDINGS Brain: Small areas of hypoattenuation within the right frontal operculum and right parietal white matter corresponding to regions of acute infarction on prior MRI. No findings of interval acute infarct, hemorrhage, or focal mass effect. Small chronic lacunar infarct within the right mid putamen. Stable chronic microvascular ischemic changes and parenchymal volume loss of the brain. Vascular: Small branching linear densities are present within the right posterior sylvian fissure (series 3 image 16 and 17). Skull: Normal. Negative for fracture or focal lesion. Sinuses: Imaged portions are clear. Orbits: No acute finding. Review of the MIP images confirms the above findings CTA NECK FINDINGS Aortic arch: Standard branching. Imaged portion shows no evidence of aneurysm or dissection. Moderate calcific atherosclerosis of the aortic arch and great vessel origins with mild less than 50% stenosis of left common carotid artery and left subclavian artery origins. Right carotid system: No evidence of dissection, stenosis (50% or greater) or occlusion. Moderate calcified plaque of the carotid bifurcation with mild less than 50% proximal ICA stenosis. Left carotid system: No evidence of dissection, stenosis (50% or greater) or occlusion. Moderate calcified plaque of the carotid bifurcation with mild less than 50% proximal ICA stenosis. Vertebral arteries: Codominant. No evidence of dissection, stenosis (50% or greater) or occlusion. Skeleton: Odontogenic disease with dental caries and periapical cyst involving the bilateral maxillary molars. Moderate cervical spondylosis with multilevel disc and facet degenerative changes. Uncovertebral and facet hypertrophy encroaches on the neural foramen greatest at the left C3-4 and right C5-C7 levels. No high-grade  bony canal stenosis. Other neck: Negative. Upper chest: Negative. Review of the MIP images confirms the above findings CTA HEAD FINDINGS Anterior circulation: Small branching linear densities on noncontrast CT of head in the right posterior sylvian fissure correspond with small branch vessel thromboses of right distal MCA superior division (series 10, image 76 and 74 and series 13, image 13). 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 3.5 x 4.3 x 2.7 mm aneurysm of the origin of left MCA superior division (series 10, image 95 and series 11, image 84). Mild calcific atherosclerosis of carotid siphons without significant stenosis. Otherwise no large vessel occlusion, aneurysm, or significant stenosis is identified. Posterior circulation: No significant stenosis, proximal occlusion, aneurysm, or vascular malformation. Venous sinuses: As permitted by contrast timing, patent. Anatomic variants: Diminutive bilateral posterior communicating arteries and small anterior communicating artery. Delayed phase: No abnormal intracranial enhancement. Review of the MIP images confirms the above findings IMPRESSION: CT head: Acute infarctions are present within right frontal operculum and right parietal white matter as seen on prior MRI. No acute hemorrhage. No new acute intracranial abnormality. CTA neck: 1. Moderate calcific atherosclerosis of aortic arch, great vessel origins, and bilateral carotid bifurcations. 2. No evidence of dissection, hemodynamically significant stenosis by NASCET criteria, occlusion, or aneurysm. 3. Moderate cervical spine degenerative changes. 4. Dental disease with periapical cysts and dental caries greatest in maxillary molars. CTA head: 1. Small M3 branch vessel thromboses of the right distal MCA superior division in the region of acute infarctions. 2. 4.7 mm aneurysm of first branch point of  right MCA M2 superior division. Early branching right MCA. 3. 4.3 mm aneurysm  of origin of left MCA M2 superior division. 4. Otherwise no additional intracranial large vessel occlusion, aneurysm, or significant stenosis. These results will be called to the ordering clinician or representative by the Radiologist Assistant, and communication documented in the PACS or zVision Dashboard. Electronically Signed   By: Kristine Garbe M.D.   On: 03/16/2017 00:53   Mr Brain Wo Contrast  Result Date: 03/15/2017 CLINICAL DATA:  Paresthesias in both hands. EXAM: MRI HEAD WITHOUT CONTRAST TECHNIQUE: Multiplanar, multiecho pulse sequences of the brain and surrounding structures were obtained without intravenous contrast. COMPARISON:  CT head without contrast 04/26/2015. FINDINGS: Brain: Restricted diffusion and associated T2 signal changes are present along the superior aspect the right sylvian fissure, inferior right frontal operculum, and right parietal white matter Additional periventricular and subcortical T2 changes are present bilaterally. A remote lacunar infarct is present within the right external capsule. The internal auditory canals are normal bilaterally. Brainstem and cerebellum are within normal limits. Vascular: Flow is present in the major intracranial arteries. The skullbase is within normal limits. Skull and upper cervical spine: The skullbase is within normal limits. The craniocervical junction is normal. Upper cervical spine is unremarkable. Sinuses/Orbits: The paranasal sinuses and mastoid air cells are clear. Globes and orbits are within normal limits. IMPRESSION: 1. Acute/subacute infarcts involving the high posterior right sylvian fissure, right frontal operculum, and right parietal white matter. This corresponds with the patient's left-sided hand and finger numbness. 2. Other periventricular and subcortical white matter changes bilaterally are moderately advanced for age and likely reflect  the sequela of chronic microvascular ischemia. These results were called by telephone at the time of interpretation on 03/15/2017 at 11:10 am to [facility], NP, who verbally acknowledged these results. Electronically Signed   By: San Morelle M.D.   On: 03/15/2017 11:25    Discharge Exam: Vitals:   03/17/17 0840 03/17/17 0845  BP:  135/73  Pulse: 66 65  Resp: 19 19  Temp:    SpO2: 97% 97%   Vitals:   03/17/17 0836 03/17/17 0837 03/17/17 0840 03/17/17 0845  BP: 135/79   135/73  Pulse: 71 69 66 65  Resp: 19 18 19 19   Temp: 98.2 F (36.8 C)     TempSrc: Oral     SpO2: 93% 94% 97% 97%  Weight:      Height:        General: Pt is alert, follows commands appropriately, not in acute distress Cardiovascular: Regular rate and rhythm, S1/S2 +, no murmurs, no rubs, no gallops Respiratory: Clear to auscultation bilaterally, no wheezing, no crackles, no rhonchi Abdominal: Soft, non tender, non distended, bowel sounds +, no guarding Extremities: no edema, no cyanosis, pulses palpable bilaterally DP and PT Neuro: Grossly nonfocal  Discharge Instructions  Discharge Instructions    Ambulatory referral to Neurology   Complete by:  As directed    An appointment is requested in approximately: 6 weeks Follow up with stroke clinic Cecille Rubin preferred, if not available, then consider Caesar Chestnut, Leta Baptist or Jaynee Eagles whoever is available) at [facility] in about 6-8 weeks. Thanks.   Diet - low sodium heart healthy   Complete by:  As directed    Increase activity slowly   Complete by:  As directed      Allergies as of 03/17/2017      Reactions   Codeine Anaphylaxis   Bupropion Other (See Comments)   Anger and mental issues      Medication List    STOP taking these  medications   aspirin EC 81 MG tablet   aspirin-acetaminophen-caffeine 250-250-65 MG tablet Commonly known as:  EXCEDRIN MIGRAINE     TAKE these medications   AEROCHAMBER MV inhaler Use as instructed   albuterol  108 (90 Base) MCG/ACT inhaler Commonly known as:  PROVENTIL HFA;VENTOLIN HFA Inhale 2 puffs into the lungs every 4 (four) hours as needed for wheezing or shortness of breath.   ALPRAZolam 0.5 MG tablet Commonly known as:  XANAX Take 1 tablet (0.5 mg total) 2 (two) times daily as needed by mouth for anxiety.   amLODipine 2.5 MG tablet Commonly known as:  NORVASC Take 1 tablet (2.5 mg total) by mouth daily. Stop taking Amlodipine 10mg .   ANORO ELLIPTA 62.5-25 MCG/INH Aepb Generic drug:  umeclidinium-vilanterol Inhale 1 puff daily into the lungs.   atorvastatin 40 MG tablet Commonly known as:  LIPITOR Take 1 tablet (40 mg total) by mouth daily at 6 PM.   clopidogrel 75 MG tablet Commonly known as:  PLAVIX Take 1 tablet (75 mg total) by mouth daily.   DULoxetine 30 MG capsule Commonly known as:  CYMBALTA 1 tablet daily for first week, then increase to two tabs once daily. What changed:    how much to take  how to take this  when to take this  additional instructions   losartan 100 MG tablet Commonly known as:  COZAAR TAKE ONE TABLET BY MOUTH ONCE DAILY What changed:    how much to take  how to take this  when to take this   multivitamin with minerals Tabs tablet Take 1 tablet daily by mouth.   traZODone 50 MG tablet Commonly known as:  DESYREL Take 1 tablet (50 mg total) by mouth at bedtime as needed for sleep.   verapamil 180 MG 24 hr capsule Commonly known as:  VERELAN PM TAKE ONE TABLET BY MOUTH ONCE DAILY What changed:    how much to take  how to take this  when to take this       Follow-up Information    Dennie Bible, NP. Schedule an appointment as soon as possible for a visit in 6 week(s).   Specialty:  Family Medicine Contact information: [address] [place] [address] [phone]        [facility] Follow up on 03/31/2017.   Specialty:  Cardiology Why:  4:00PM, wound check  visit Contact information: [address], [place], NP Follow up.   Specialty:  Family Medicine Contact information: [place] [address] [phone]            The results of significant diagnostics from this hospitalization (including imaging, microbiology, ancillary and laboratory) are listed below for reference.     Microbiology: No results found for this or any previous visit (from the past 240 hour(s)).   Labs: Basic Metabolic Panel: Recent Labs  Lab 03/15/17 1659 03/15/17 1711 03/17/17 0450  NA 140 141 140  K 3.4* 3.3* 4.0  CL 109 106 108  CO2 25  --  26  GLUCOSE 103* 113* 90  BUN 9 11 11   CREATININE 0.66 0.70 0.57  CALCIUM 9.3  --  8.9   Liver Function Tests: Recent Labs  Lab 03/15/17 1659  AST 22  ALT 15  ALKPHOS 107  BILITOT 0.2*  PROT 6.1*  ALBUMIN 3.5   CBC: Recent Labs  Lab 03/15/17 1659 03/15/17 1711 03/17/17 0450  WBC 8.8  --  8.3  NEUTROABS 5.3  --   --   HGB 14.6 15.0 13.7  HCT 43.9 44.0 42.6  MCV 92.2  --  92.8  PLT 262  --  226   BNP (last 3 results) Recent Labs    09/25/16 1736  BNP 56.6   CBG: Recent Labs  Lab 03/15/17 2010  GLUCAP 87   SIGNED: Time coordinating discharge: 60 minutes  Faye Ramsay, MD  [facility] 03/17/2017, 1:04 PM Pager [phone]  If 7PM-7AM, please contact night-coverage [email or web address] Password [email or web address]

## 2017-03-17 NOTE — Interval H&P Note (Signed)
History and Physical Interval Note:  03/17/2017 7:48 AM  Heidi Foster  has presented today for surgery, with the diagnosis of STROKE  The various methods of treatment have been discussed with the patient and family. After consideration of risks, benefits and other options for treatment, the patient has consented to  Procedure(s): TRANSESOPHAGEAL ECHOCARDIOGRAM (TEE) (N/A) as a surgical intervention .  The patient's history has been reviewed, patient examined, no change in status, stable for surgery.  I have reviewed the patient's chart and labs.  Questions were answered to the patient's satisfaction.     [name]

## 2017-03-31 ENCOUNTER — Other Ambulatory Visit: Payer: Self-pay

## 2017-03-31 ENCOUNTER — Ambulatory Visit (INDEPENDENT_AMBULATORY_CARE_PROVIDER_SITE_OTHER): Payer: Self-pay | Admitting: *Deleted

## 2017-03-31 DIAGNOSIS — I639 Cerebral infarction, unspecified: Secondary | ICD-10-CM

## 2017-03-31 LAB — CUP PACEART INCLINIC DEVICE CHECK
Date Time Interrogation Session: 20181204162256
Implantable Pulse Generator Implant Date: 20181120

## 2017-03-31 NOTE — Patient Outreach (Signed)
[facility]) Care Management  03/31/2017  Heidi Foster 14-Dec-1955 [id number]   EMMI: stroke Referral date: 03/31/17 Referral source: EMMI stroke red alert Referral reason: Martin Majestic to follow up appointment Day # 13  Telephone call to patient regarding EMMI stroke red alert. HIPAA verified. Discussed EMMI stroke program with patient. Patient states her follow up appointment for her loop recorder check is today and she follows up with her primary provider on tomorrow 04/01/17.  Patient states she still has some left sided body weakness. Patient states she has family support. Patient states she has transportation to her appointments. Patient reports she has her medications and takes them as prescribed. Patient denies any new onset of symptoms.  RNCM reviewed signs/ symptoms of stroke. Patient advised to call 911 for stroke like symptoms.  RNCM advised patient to keep follow up appointments.  Call doctor for any non emergent symptoms/ concerns.  RNCM advised patient to continue to take her medications as prescribed.  RNCM verified patient aware of 911 services for urgent/ emergent needs. Patient verbalized understanding. Patient denies any further concerns at this time.   ASSESSMENT;  Per chart: PCP: Esaw Grandchild, NP  Admit date: 03/15/2017 Discharge date: 03/17/2017  Discharge Diagnoses:  Principal Problem:   Stroke [facility]) Active Problems:   Hypertension   PLAN: RNCM will refer patient to care management assistant to close due to patient being assessed and having no further needs.   Quinn Plowman RN,BSN,CCM [facility] Telephonic  [phone]

## 2017-03-31 NOTE — Progress Notes (Signed)
Wound check appointment. Steri-strips previously removed by patient. Wound without redness or edema. Incision edges approximated, wound well healed. Normal device function. Battery status: good. R-waves 0.42mV. No symptom, tachy, or AF episode. Pause and brady detection off at implant. Patient educated about wound care and Carelink monitor. Monthly summary reports and ROV with GT PRN.

## 2017-04-01 ENCOUNTER — Ambulatory Visit (INDEPENDENT_AMBULATORY_CARE_PROVIDER_SITE_OTHER): Payer: BLUE CROSS/BLUE SHIELD | Admitting: Adult Health

## 2017-04-01 ENCOUNTER — Encounter: Payer: Self-pay | Admitting: Adult Health

## 2017-04-01 VITALS — BP 122/73 | HR 70 | Ht 60.75 in | Wt 139.9 lb

## 2017-04-01 DIAGNOSIS — F17200 Nicotine dependence, unspecified, uncomplicated: Secondary | ICD-10-CM | POA: Diagnosis not present

## 2017-04-01 DIAGNOSIS — G4700 Insomnia, unspecified: Secondary | ICD-10-CM | POA: Diagnosis not present

## 2017-04-01 DIAGNOSIS — I63411 Cerebral infarction due to embolism of right middle cerebral artery: Secondary | ICD-10-CM | POA: Diagnosis not present

## 2017-04-01 DIAGNOSIS — Z0000 Encounter for general adult medical examination without abnormal findings: Secondary | ICD-10-CM | POA: Diagnosis not present

## 2017-04-01 DIAGNOSIS — Z8673 Personal history of transient ischemic attack (TIA), and cerebral infarction without residual deficits: Secondary | ICD-10-CM | POA: Diagnosis not present

## 2017-04-01 DIAGNOSIS — R5383 Other fatigue: Secondary | ICD-10-CM | POA: Diagnosis not present

## 2017-04-01 DIAGNOSIS — I10 Essential (primary) hypertension: Secondary | ICD-10-CM

## 2017-04-01 NOTE — Progress Notes (Addendum)
Subjective:    Patient ID: Heidi Foster, female    DOB: January 17, 1956, 61 y.o.   MRN: [id number] 01/22/2017 OV Notes: HPI:  Heidi Foster is here for CPE: PMH: HTN, GERD, Dysphagia, HL, tobacco use disorder,and  Fatigue.  10/08/16:Transnasal Laryngoscopy, impression: Bilateral vocal cord polyps.    11/13/16:She has MICROLARYNGOSCOPY WITH LASER.  01/12/17: Pulmonology visit, records reviewed and sig points: Centrilobular emphysema, started on Trelegy in place of Anoro.  Smoking cessation discussed and she is self-reducing tobacco use. LD CHEST CT W/O 08/01/16 (per radiologist):  Lung-RADS Category 1, negative. Repeat CT in 1 year. Borderline ascending aortic aneurysm. Mild centrilobular emphysema.  Instructed to f/u in 3 months.  01/14/17: GYN visit: PAP completed.  Fibrocystic Breasts L>R, she has f/u L dx mammogram on 02/04/17 F/u with GYN 02/04/17  She is down to < half/pack cigarettes/day-GREAT. She continues to have dyspnea and reports that she can only ambulate 200 yards without needing to stop/rest. She also reports increase in lumbar back pain and bil lower extremity pain and tingling. She cannot exercise or work due to "my breathing and pain and I am waiting on my disability claim".   She denies anxiety/depression or thoughts of harming herself/others. She continues to stressed that she is completely financially dependent on her son and daughter-in-law.  Healthcare Maintenance: PAP-completed last week Mammogram- completed in Spring and f/u imaging scheduled 02/04/17 CT- completed 07/2016, repeat in 1 year Colonoscopy- has never had one and order has been placed.  03/09/2017 Notes: Heidi Foster is here for f/u: treatment of Fibromyalgia with Duloxetine 30mg  BID.  She reports reduction of overall pain from 10/10 to 7/10.  She continues to walk as often as possible and even stretches daily.  She drinks 3-4 16 oz bottles of water and is still trying to reduce tobacco use, smoking 1/2 pack /day.  Last  Thursday 03/05/17, she experienced bil tingling in hands/fingers, and then briefly had change in vision described as "small areas of black spots" and mild dizziness that all self resolved within an hr.  She continues to have intermittent L hand/fingers numbness/tingling-none at present. Yesterday morning, while making her bed she "felt woozie and all of a sudden very tired" that resolved by mid-morning.  She had TIA/MI in early 2000s and has never ben seen by Neurologist. BP remains stable and she does not have Hx of Diabetes. She denies any acute neurological sx's during OV.  04/01/17 OV: Heidi Foster is here for hosp f/u 03/15/17- 03/17/17:    MR Brain 03/15/17-  IMPRESSION: 1. Acute/subacute infarcts involving the high posterior right sylvian fissure, right frontal operculum, and right parietal white matter. This corresponds with the patient's left-sided hand and finger numbness. 2. Other periventricular and subcortical white matter changes bilaterally are moderately advanced for age and likely reflect the sequela of chronic microvascular ischemia.  CTA head 03/15/17: 1. Small M3 branch vessel thromboses of the right distal MCA superior division in the region of acute infarctions. 2. 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 Early branching right MCA. 3. 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 4. Otherwise no additional intracranial large vessel occlusion, aneurysm, or significant stenosis.  She had her Implanted Loop Recorder was interrogated yesterday and she denies Cp/dyspnea/HA/dizziness/parethesia/palpitations/changes in vision. She reports medication compliance and denies SE. She has not used tobacco since 03/15/17- WONDERFUL! She has been safely ambulating with walker and been slowly rebuilding her strength with home exercises. Only  acute complaint- insomnia, she denies using the Trazodone 50 mg that was provided at d/c. We will  obtain BMP, CBC today   Patient Care Team    Relationship Specialty Notifications Start End  Esaw Grandchild, NP PCP - General Family Medicine  05/26/16     Patient Active Problem List   Diagnosis Date Noted  . Insomnia 04/01/2017  . Cerebral aneurysm   . Stroke ([place]) 03/15/2017  . Paresthesia of both hands 03/09/2017  . Hx of transient ischemic attack (TIA) 03/09/2017  . Fibromyalgia 01/22/2017  . Breast pain, left 12/15/2016  . Generalized weakness 12/15/2016  . Leg pain, bilateral 12/15/2016  . 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 Centrilobular emphysema ([place]) 09/09/2016  . GERD (gastroesophageal reflux disease) 06/30/2016  . Dysphagia 06/30/2016  . Health care maintenance 06/25/2016  . Other fatigue 05/26/2016  . Hypertension 05/26/2016  . Other hyperlipidemia 05/26/2016  . Family history of diabetes mellitus in mother 05/26/2016  . Acute pain of right shoulder 05/26/2016  . Tobacco use disorder 05/26/2016  . Dyspnea on exertion 05/26/2016  . Chronic pain of right knee 05/26/2016  . History of MI (myocardial infarction) 05/26/2016  . History of TIA (transient ischemic attack) 05/26/2016  . Numbness and tingling of left lower extremity 05/26/2016  . Numbness and tingling in left hand 05/26/2016  . 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 Allergy   . Anginal pain ([place])   . Arthritis   . 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 Closed fracture of right patella   . Emphysema of lung ([place])   . GERD (gastroesophageal reflux disease)   . Heart attack ([place]) 1999  . Hypertension   . Osteopenia 12/2016   T score -1.2 FRAX 7.4%/0.8%  . Seborrheic keratoses   . Stroke ([place])    TIA x 2, early 2000's   . Vocal cord polyp      Past Surgical History:  Procedure Laterality Date  . BREAST BIOPSY    . BREAST SURGERY     Left Breast nodule markers  . CARDIAC CATHETERIZATION     1999  . caridac cath    . LOOP RECORDER INSERTION N/A 03/17/2017    Procedure: LOOP RECORDER INSERTION;  Surgeon: Evans Lance, MD;  Location: [facility];  Service: Cardiovascular;  Laterality: N/A;  . lt. ovary removed    . MICROLARYNGOSCOPY WITH LASER Bilateral 11/13/2016   Procedure: MICROLARYNGOSCOPY;  Surgeon: Melissa Montane, MD;  Location: [facility] OR;  Service: ENT;  Laterality: Bilateral;  . polyp removal  10/2016   throat -   . SKIN BIOPSY     nose  . TEE WITHOUT CARDIOVERSION N/A 03/17/2017   Procedure: TRANSESOPHAGEAL ECHOCARDIOGRAM (TEE);  Surgeon: Dorothy Spark, MD;  Location: [facility] ENDOSCOPY;  Service: Cardiovascular;  Laterality: N/A;  . TUBAL LIGATION       Family History  Problem Relation Age of Onset  . Diabetes Mother   . Hypertension Mother   . Stroke Mother   . Alcohol abuse Father   . Multiple sclerosis Sister   . Cancer Brother        lung  . Stroke Maternal Grandmother   . Heart attack Maternal Grandmother   . Hypertension Maternal Grandmother   . Colon cancer Neg Hx   . Colon polyps Neg Hx   . Esophageal cancer Neg Hx   . Stomach cancer Neg Hx   . Rectal  cancer Neg Hx      Social History   Substance and Sexual Activity  Drug Use No     Social History   Substance and Sexual Activity  Alcohol Use No     Social History   Tobacco Use  Smoking Status Current Every Day Smoker  . Packs/day: 0.50  . Years: 40.00  . Pack years: 20.00  . Types: Cigarettes  . Start date: 09/27/1976  Smokeless Tobacco Never Used  Tobacco Comment   Peak rate of 1ppd     Outpatient Encounter Medications as of 04/01/2017  Medication Sig Note  . albuterol (PROVENTIL HFA;VENTOLIN HFA) 108 (90 Base) MCG/ACT inhaler Inhale 2 puffs into the lungs every 4 (four) hours as needed for wheezing or shortness of breath.   Marland Kitchen amLODipine (NORVASC) 2.5 MG tablet Take 1 tablet (2.5 mg total) by mouth daily. Stop taking Amlodipine 10mg .   . atorvastatin (LIPITOR) 40 MG tablet Take 1 tablet (40 mg total) by mouth daily at 6 PM.   .  clopidogrel (PLAVIX) 75 MG tablet Take 1 tablet (75 mg total) by mouth daily.   . DULoxetine (CYMBALTA) 30 MG capsule 1 tablet daily for first week, then increase to two tabs once daily. (Patient taking differently: Take 30 mg 2 (two) times daily by mouth. ) 03/15/2017: Pt reports taking 1 tablet twice daily for past month  . losartan (COZAAR) 100 MG tablet TAKE ONE TABLET BY MOUTH ONCE DAILY   . Multiple Vitamin (MULTIVITAMIN WITH MINERALS) TABS tablet Take 1 tablet daily by mouth.   . Spacer/Aero-Holding Chambers (AEROCHAMBER MV) inhaler Use as instructed   . umeclidinium-vilanterol (ANORO ELLIPTA) 62.5-25 MCG/INH AEPB Inhale 1 puff daily into the lungs.   . verapamil (VERELAN PM) 180 MG 24 hr capsule TAKE ONE TABLET BY MOUTH ONCE DAILY   . [DISCONTINUED] ALPRAZolam (XANAX) 0.5 MG tablet Take 1 tablet (0.5 mg total) 2 (two) times daily as needed by mouth for anxiety. (Patient not taking: Reported on 03/15/2017) 03/15/2017: Prescribed for use prior to MRI  . [DISCONTINUED] traZODone (DESYREL) 50 MG tablet Take 1 tablet (50 mg total) by mouth at bedtime as needed for sleep.    No facility-administered encounter medications on file as of 04/01/2017.     Allergies: Codeine and Bupropion  Body mass index is 26.65 kg/m.  Blood pressure 122/73, pulse 70, height 5' 0.75" (1.543 m), weight 139 lb 14.4 oz (63.5 kg).   Review of Systems  Constitutional: Positive for fatigue. Negative for activity change, appetite change, chills, diaphoresis and fever.  HENT: Positive for voice change. Negative for congestion, postnasal drip and trouble swallowing.   Eyes: Negative for visual disturbance.  Respiratory: Positive for cough and shortness of breath. Negative for chest tightness, wheezing and stridor.   Cardiovascular: Negative for chest pain, palpitations and leg swelling.  Gastrointestinal: Negative for abdominal distention, abdominal pain, blood in stool, constipation, nausea and vomiting.   Endocrine: Negative for cold intolerance, heat intolerance, polydipsia, polyphagia and polyuria.  Genitourinary: Negative for flank pain and hematuria.  Musculoskeletal: Positive for arthralgias, back pain, gait problem, joint swelling, myalgias, neck pain and neck stiffness.  Skin: Negative for color change, pallor, rash and wound.  Neurological: Negative for dizziness, weakness and headaches.       Intermittent L hand/finger numbness/tingling since 03/05/17-none at present  Hematological: Bruises/bleeds easily.  Psychiatric/Behavioral: Positive for sleep disturbance.       4 pillow orthopnea       Objective:   Physical Exam  Constitutional: She is oriented to person, place, and time. She appears well-developed and well-nourished. No distress.  HENT:  Head: Normocephalic and atraumatic.  Right Ear: Tympanic membrane and ear canal normal. Decreased hearing is noted.  Left Ear: Tympanic membrane and ear canal normal. Decreased hearing is noted.  Mouth/Throat: Mucous membranes are normal. Abnormal dentition. Dental caries present.  Eyes: Conjunctivae are normal. Pupils are equal, round, and reactive to light.  Neck: Normal range of motion.  Cardiovascular: Normal rate, normal heart sounds and intact distal pulses.  No murmur heard. Pulmonary/Chest: Effort normal. No respiratory distress. She has decreased breath sounds in the right lower field and the left lower field. She has no wheezes. She has no rhonchi. She has no rales. She exhibits no tenderness. Right breast exhibits no inverted nipple, no mass, no nipple discharge and no skin change. Left breast exhibits no inverted nipple, no nipple discharge and no skin change.  Abdominal: Soft. Bowel sounds are normal. She exhibits no distension and no mass. There is no tenderness. There is no rebound and no guarding.  Musculoskeletal: She exhibits tenderness. She exhibits no deformity.       Right hip: She exhibits tenderness.       Left hip:  She exhibits tenderness.       Right knee: Tenderness found.       Left knee: Tenderness found.       Right ankle: Tenderness.       Left ankle: Tenderness.       Thoracic back: She exhibits tenderness.       Lumbar back: She exhibits tenderness.  L hand grip slightly weaker than R hand grip  Lymphadenopathy:    She has no cervical adenopathy.  Neurological: She is alert and oriented to person, place, and time. No cranial nerve deficit. Coordination normal.  Skin: Skin is warm and dry. No rash noted. She is not diaphoretic. No erythema. No pallor.  Psychiatric: She has a normal mood and affect. Her behavior is normal. Judgment and thought content normal.  Nursing note and vitals reviewed.         Assessment & Plan:   1. Essential hypertension   2. Other fatigue   3. Cerebrovascular accident (CVA) due to embolism of right middle cerebral artery ([place])   4. History of TIA (transient ischemic attack)   5. Health care maintenance   6. Insomnia, unspecified type   7. Tobacco use disorder     Health care maintenance Continue all medications as directed. CBC/BMP drawn today We will call when la results are available. Follow-up in 3 months, sooner if needed. NICE TO SEE YOU! GREAT JOB ON QUITTING SMOKING!!!!  History of TIA (transient ischemic attack) Neurology referral placed.   Tobacco use disorder Last use of tobacco was 03/15/17- GREAT!  Insomnia Use Trazodone 1/2- 1 tab nightly PRN insomnia [facility] if refill needed.    FOLLOW-UP:  Return in about 3 months (around 06/30/2017) for Regular Follow Up.

## 2017-04-01 NOTE — Assessment & Plan Note (Signed)
Last use of tobacco was 03/15/17- GREAT!

## 2017-04-01 NOTE — Assessment & Plan Note (Signed)
Neurology referral placed

## 2017-04-01 NOTE — Assessment & Plan Note (Signed)
Use Trazodone 1/2- 1 tab nightly PRN insomnia Practice [facility] if refill needed.

## 2017-04-01 NOTE — Patient Instructions (Addendum)
Heart-Healthy Eating Plan Many factors influence your heart health, including eating and exercise habits. Heart (coronary) risk increases with abnormal blood fat (lipid) levels. Heart-healthy meal planning includes limiting unhealthy fats, increasing healthy fats, and making other small dietary changes. This includes maintaining a healthy body weight to help keep lipid levels within a normal range. What is my plan? Your health care provider recommends that you:  Get no more than __25___% of the total calories in your daily diet from fat.  Limit your intake of saturated fat to less than __5___% of your total calories each day.  Limit the amount of cholesterol in your diet to less than _300___ mg per day.  What types of fat should I choose?  Choose healthy fats more often. Choose monounsaturated and polyunsaturated fats, such as olive oil and canola oil, flaxseeds, walnuts, almonds, and seeds.  Eat more omega-3 fats. Good choices include salmon, mackerel, sardines, tuna, flaxseed oil, and ground flaxseeds. Aim to eat fish at least two times each week.  Limit saturated fats. Saturated fats are primarily found in animal products, such as meats, butter, and cream. Plant sources of saturated fats include palm oil, palm kernel oil, and coconut oil.  Avoid foods with partially hydrogenated oils in them. These contain trans fats. Examples of foods that contain trans fats are stick margarine, some tub margarines, cookies, crackers, and other baked goods. What general guidelines do I need to follow?  Check food labels carefully to identify foods with trans fats or high amounts of saturated fat.  Fill one half of your plate with vegetables and green salads. Eat 4-5 servings of vegetables per day. A serving of vegetables equals 1 cup of raw leafy vegetables,  cup of raw or cooked cut-up vegetables, or  cup of vegetable juice.  Fill one fourth of your plate with whole grains. Look for the word "whole"  as the first word in the ingredient list.  Fill one fourth of your plate with lean protein foods.  Eat 4-5 servings of fruit per day. A serving of fruit equals one medium whole fruit,  cup of dried fruit,  cup of fresh, frozen, or canned fruit, or  cup of 100% fruit juice.  Eat more foods that contain soluble fiber. Examples of foods that contain this type of fiber are apples, broccoli, carrots, beans, peas, and barley. Aim to get 20-30 g of fiber per day.  Eat more home-cooked food and less restaurant, buffet, and fast food.  Limit or avoid alcohol.  Limit foods that are high in starch and sugar.  Avoid fried foods.  Cook foods by using methods other than frying. Baking, boiling, grilling, and broiling are all great options. Other fat-reducing suggestions include: ? Removing the skin from poultry. ? Removing all visible fats from meats. ? Skimming the fat off of stews, soups, and gravies before serving them. ? Steaming vegetables in water or broth.  Lose weight if you are overweight. Losing just 5-10% of your initial body weight can help your overall health and prevent diseases such as diabetes and heart disease.  Increase your consumption of nuts, legumes, and seeds to 4-5 servings per week. One serving of dried beans or legumes equals  cup after being cooked, one serving of nuts equals 1 ounces, and one serving of seeds equals  ounce or 1 tablespoon.  You may need to monitor your salt (sodium) intake, especially if you have high blood pressure. Talk with your health care provider or dietitian to get  more information about reducing sodium. What foods can I eat? Grains  Breads, including [place], white, pita, wheat, raisin, rye, oatmeal, and [place]. Tortillas that are neither fried nor made with lard or trans fat. Low-fat rolls, including hotdog and hamburger buns and English muffins. Biscuits. Muffins. Waffles. Pancakes. Light popcorn. Whole-grain cereals. Flatbread. Melba  toast. Pretzels. Breadsticks. Rusks. Low-fat snacks and crackers, including oyster, saltine, matzo, graham, animal, and rye. Rice and pasta, including brown rice and those that are made with whole wheat. Vegetables All vegetables. Fruits All fruits, but limit coconut. Meats and Other Protein Sources Lean, well-trimmed beef, veal, pork, and lamb. Chicken and [place] without skin. All fish and shellfish. Wild duck, rabbit, pheasant, and venison. Egg whites or low-cholesterol egg substitutes. Dried beans, peas, lentils, and tofu.Seeds and most nuts. Dairy Low-fat or nonfat cheeses, including ricotta, string, and mozzarella. Skim or 1% milk that is liquid, powdered, or evaporated. Buttermilk that is made with low-fat milk. Nonfat or low-fat yogurt. Beverages Mineral water. Diet carbonated beverages. Sweets and Desserts Sherbets and fruit ices. Honey, jam, marmalade, jelly, and syrups. Meringues and gelatins. Pure sugar candy, such as hard candy, jelly beans, gumdrops, mints, marshmallows, and small amounts of dark chocolate. [employer] Eat all sweets and desserts in moderation. Fats and Oils Nonhydrogenated (trans-free) margarines. Vegetable oils, including soybean, sesame, sunflower, olive, peanut, safflower, corn, canola, and cottonseed. Salad dressings or mayonnaise that are made with a vegetable oil. Limit added fats and oils that you use for cooking, baking, salads, and as spreads. Other Cocoa powder. Coffee and tea. All seasonings and condiments. The items listed above may not be a complete list of recommended foods or beverages. Contact your dietitian for more options. What foods are not recommended? Grains Breads that are made with saturated or trans fats, oils, or whole milk. Croissants. Butter rolls. Cheese breads. Sweet rolls. Donuts. Buttered popcorn. Chow mein noodles. High-fat crackers, such as cheese or butter crackers. Meats and Other Protein Sources Fatty meats, such as  hotdogs, short ribs, sausage, spareribs, bacon, ribeye roast or steak, and mutton. High-fat deli meats, such as salami and bologna. Caviar. Domestic duck and goose. Organ meats, such as kidney, liver, sweetbreads, brains, gizzard, chitterlings, and heart. Dairy Cream, sour cream, cream cheese, and creamed cottage cheese. Whole milk cheeses, including blue (bleu), Monterey Jack, [place], [place], American, [place], Swiss, [place], [place], and [place]. Whole or 2% milk that is liquid, evaporated, or condensed. Whole buttermilk. Cream sauce or high-fat cheese sauce. Yogurt that is made from whole milk. Beverages Regular sodas and drinks with added sugar. Sweets and Desserts Frosting. Pudding. Cookies. Cakes other than angel food cake. Candy that has milk chocolate or white chocolate, hydrogenated fat, butter, coconut, or unknown ingredients. Buttered syrups. Full-fat ice cream or ice cream drinks. Fats and Oils Gravy that has suet, meat fat, or shortening. Cocoa butter, hydrogenated oils, palm oil, coconut oil, palm kernel oil. These can often be found in baked products, candy, fried foods, nondairy creamers, and whipped toppings. Solid fats and shortenings, including bacon fat, salt pork, lard, and butter. Nondairy cream substitutes, such as coffee creamers and sour cream substitutes. Salad dressings that are made of unknown oils, cheese, or sour cream. The items listed above may not be a complete list of foods and beverages to avoid. Contact your dietitian for more information. This information is not intended to replace advice given to you by your health care provider. Make sure you discuss any questions you have with your health care  provider. Document Released: 01/22/2008 Document Revised: 11/02/2015 Document Reviewed: 10/06/2013 Elsevier Interactive Patient Education  2017 [place].  Insomnia Insomnia is a sleep disorder that makes it difficult to fall asleep or to stay asleep. Insomnia can  cause tiredness (fatigue), low energy, difficulty concentrating, mood swings, and poor performance at work or school. There are three different ways to classify insomnia:  Difficulty falling asleep.  Difficulty staying asleep.  Waking up too early in the morning.  Any type of insomnia can be long-term (chronic) or short-term (acute). Both are common. Short-term insomnia usually lasts for three months or less. Chronic insomnia occurs at least three times a week for longer than three months. What are the causes? Insomnia may be caused by another condition, situation, or substance, such as:  Anxiety.  Certain medicines.  Gastroesophageal reflux disease (GERD) or other gastrointestinal conditions.  Asthma or other breathing conditions.  Restless legs syndrome, sleep apnea, or other sleep disorders.  Chronic pain.  Menopause. This may include hot flashes.  Stroke.  Abuse of alcohol, tobacco, or illegal drugs.  Depression.  Caffeine.  Neurological disorders, such as Alzheimer disease.  An overactive thyroid (hyperthyroidism).  The cause of insomnia may not be known. What increases the risk? Risk factors for insomnia include:  Gender. Women are more commonly affected than men.  Age. Insomnia is more common as you get older.  Stress. This may involve your professional or personal life.  Income. Insomnia is more common in people with lower income.  Lack of exercise.  Irregular work schedule or night shifts.  Traveling between different time zones.  What are the signs or symptoms? If you have insomnia, trouble falling asleep or trouble staying asleep is the main symptom. This may lead to other symptoms, such as:  Feeling fatigued.  Feeling nervous about going to sleep.  Not feeling rested in the morning.  Having trouble concentrating.  Feeling irritable, anxious, or depressed.  How is this treated? Treatment for insomnia depends on the cause. If your  insomnia is caused by an underlying condition, treatment will focus on addressing the condition. Treatment may also include:  Medicines to help you sleep.  Counseling or therapy.  Lifestyle adjustments.  Follow these instructions at home:  Take medicines only as directed by your health care provider.  Keep regular sleeping and waking hours. Avoid naps.  Keep a sleep diary to help you and your health care provider figure out what could be causing your insomnia. Include: ? When you sleep. ? When you wake up during the night. ? How well you sleep. ? How rested you feel the next day. ? Any side effects of medicines you are taking. ? What you eat and drink.  Make your bedroom a comfortable place where it is easy to fall asleep: ? Put up shades or special blackout curtains to block light from outside. ? Use a white noise machine to block noise. ? Keep the temperature cool.  Exercise regularly as directed by your health care provider. Avoid exercising right before bedtime.  Use relaxation techniques to manage stress. Ask your health care provider to suggest some techniques that may work well for you. These may include: ? Breathing exercises. ? Routines to release muscle tension. ? Visualizing peaceful scenes.  Cut back on alcohol, caffeinated beverages, and cigarettes, especially close to bedtime. These can disrupt your sleep.  Do not overeat or eat spicy foods right before bedtime. This can lead to digestive discomfort that can make it hard for  you to sleep.  Limit screen use before bedtime. This includes: ? Watching TV. ? Using your smartphone, tablet, and computer.  Stick to a routine. This can help you fall asleep faster. Try to do a quiet activity, brush your teeth, and go to bed at the same time each night.  Get out of bed if you are still awake after 15 minutes of trying to sleep. Keep the lights down, but try reading or doing a quiet activity. When you feel sleepy, go  back to bed.  Make sure that you drive carefully. Avoid driving if you feel very sleepy.  Keep all follow-up appointments as directed by your health care provider. This is important. Contact a health care provider if:  You are tired throughout the day or have trouble in your daily routine due to sleepiness.  You continue to have sleep problems or your sleep problems get worse. Get help right away if:  You have serious thoughts about hurting yourself or someone else. This information is not intended to replace advice given to you by your health care provider. Make sure you discuss any questions you have with your health care provider. Document Released: 04/11/2000 Document Revised: 09/14/2015 Document Reviewed: 01/13/2014 Elsevier Interactive Patient Education  2018 [place] all medications as directed. We will call when la results are available. Neurology referral placed. Please take 1/2- 1 tablet of Trazodone at bedtime, as needed for insomnia. Follow-up in 3 months, sooner if needed. NICE TO SEE YOU! GREAT JOB ON QUITTING SMOKING!!!!

## 2017-04-01 NOTE — Assessment & Plan Note (Addendum)
Continue all medications as directed. CBC/BMP drawn today We will call when la results are available. Follow-up in 3 months, sooner if needed. NICE TO SEE YOU! GREAT JOB ON QUITTING SMOKING!!!!

## 2017-04-02 LAB — CBC
HEMATOCRIT: 40.1 % (ref 34.0–46.6)
HEMOGLOBIN: 13.3 g/dL (ref 11.1–15.9)
MCH: 30.9 pg (ref 26.6–33.0)
MCHC: 33.2 g/dL (ref 31.5–35.7)
MCV: 93 fL (ref 79–97)
Platelets: 250 10*3/uL (ref 150–379)
RBC: 4.31 x10E6/uL (ref 3.77–5.28)
RDW: 13.5 % (ref 12.3–15.4)
WBC: 8.9 10*3/uL (ref 3.4–10.8)

## 2017-04-02 LAB — BASIC METABOLIC PANEL
BUN/Creatinine Ratio: 20 (ref 12–28)
BUN: 11 mg/dL (ref 8–27)
CALCIUM: 9.2 mg/dL (ref 8.7–10.3)
CHLORIDE: 109 mmol/L — AB (ref 96–106)
CO2: 24 mmol/L (ref 20–29)
CREATININE: 0.54 mg/dL — AB (ref 0.57–1.00)
GFR calc Af Amer: 119 mL/min/{1.73_m2} (ref >59)
GFR calc non Af Amer: 103 mL/min/{1.73_m2} (ref >59)
GLUCOSE: 113 mg/dL — AB (ref 65–99)
Potassium: 3.4 mmol/L — ABNORMAL LOW (ref 3.5–5.2)
Sodium: 145 mmol/L — ABNORMAL HIGH (ref 134–144)

## 2017-04-06 ENCOUNTER — Other Ambulatory Visit: Payer: Self-pay | Admitting: Adult Health

## 2017-04-06 DIAGNOSIS — R7989 Other specified abnormal findings of blood chemistry: Secondary | ICD-10-CM

## 2017-04-16 ENCOUNTER — Ambulatory Visit (INDEPENDENT_AMBULATORY_CARE_PROVIDER_SITE_OTHER): Payer: BLUE CROSS/BLUE SHIELD | Admitting: *Deleted

## 2017-04-16 DIAGNOSIS — I639 Cerebral infarction, unspecified: Secondary | ICD-10-CM | POA: Diagnosis not present

## 2017-04-16 NOTE — Progress Notes (Signed)
Carelink Summary Report / Loop Recorder 

## 2017-04-23 ENCOUNTER — Telehealth: Payer: Self-pay

## 2017-04-23 NOTE — Telephone Encounter (Signed)
Received Epic notification that pt has not read MyChart message regarding results.    Katy asked that I inform you that your labs showed basic metabolic panel is stable, except your potassium which was only slightly low at 3.4 (normal is 3.5-5.2).  Please increase your intake of potassium rich foods.  Your sodium level was only slightly high at 145 (normal is 134-144)  Your creatinine showed a small decrease to 0.54. Please increase your water intake and we will re-check 3 months.   Your complete blood count was stable.     How are you feeling?   LVM for pt to call to discuss results.  Charyl Bigger, CMA

## 2017-04-24 ENCOUNTER — Other Ambulatory Visit: Payer: Self-pay | Admitting: Adult Health

## 2017-04-24 MED ORDER — TRAZODONE HCL 50 MG PO TABS: 50.0000 mg | ORAL_TABLET | Freq: Every evening | ORAL | 1 refills | Status: DC | PRN

## 2017-04-24 NOTE — Telephone Encounter (Signed)
clopidogrel (PLAVIX) 75 MG tablet [[id number]]  Order Details  Dose: 75 mg Route: Oral Frequency: Daily  Dispense Quantity: 30 tablet Refills: 0 Fills remaining: --        Sig: Take 1 tablet (75 mg total) by mouth daily.          & trazodone 50 MG ( looks like Valetta Fuller has never prescribed)--  Please send to [facility] on [address] for refills.  --glh

## 2017-04-24 NOTE — Telephone Encounter (Signed)
LVM informing pt of trazodone RX and to call neuro for refill on clopidogrel.  Charyl Bigger, CMA

## 2017-04-24 NOTE — Telephone Encounter (Signed)
Please refill trazadone, we have filled this previously for her. She should request Clopidogrel 75mg  once daily from her Neurologist. Thanks! Valetta Fuller

## 2017-04-24 NOTE — Telephone Encounter (Signed)
LVM for pt to call to discuss results.  T. Sahily Biddle, CMA  

## 2017-04-24 NOTE — Telephone Encounter (Signed)
Pt is also requesting RX for trazadone.  We have not prescribed these medications for the patient previously.  Please review and refill if appropriate.  Charyl Bigger, CMA

## 2017-04-24 NOTE — Telephone Encounter (Signed)
LVM for pt to call to discuss results.  T. Nelson, CMA  

## 2017-04-27 NOTE — Telephone Encounter (Signed)
Letter mailed to pt since unsuccessful attempts to reach pt by phone.  Charyl Bigger, CMA

## 2017-04-30 LAB — CUP PACEART REMOTE DEVICE CHECK
Implantable Pulse Generator Implant Date: 20181120
MDC IDC SESS DTM: 20181220154504

## 2017-05-18 ENCOUNTER — Ambulatory Visit (INDEPENDENT_AMBULATORY_CARE_PROVIDER_SITE_OTHER): Payer: BLUE CROSS/BLUE SHIELD | Admitting: *Deleted

## 2017-05-18 DIAGNOSIS — I639 Cerebral infarction, unspecified: Secondary | ICD-10-CM

## 2017-05-18 NOTE — Progress Notes (Signed)
Carelink Summary Report / Loop Recorder 

## 2017-05-25 LAB — CUP PACEART REMOTE DEVICE CHECK
Date Time Interrogation Session: 20190119164509
Implantable Pulse Generator Implant Date: 20181120

## 2017-06-02 ENCOUNTER — Other Ambulatory Visit: Payer: Self-pay | Admitting: Adult Health

## 2017-06-08 ENCOUNTER — Other Ambulatory Visit: Payer: Self-pay | Admitting: Adult Health

## 2017-06-08 MED ORDER — AMLODIPINE BESYLATE 2.5 MG PO TABS: 2.5000 mg | ORAL_TABLET | Freq: Every day | ORAL | 0 refills | Status: DC

## 2017-06-08 NOTE — Telephone Encounter (Signed)
Patient called requested Refill on: verapamil (VERELAN PM) 180 MG 24 hr capsule [[id number]]  Order Details  Dose, Route, Frequency: As Directed   Dispense Quantity: 90 capsule Refills: 0 Fills remaining: --        Sig: TAKE 1 CAPSULE BY MOUTH ONCE DAILY     &   amLODipine (NORVASC) 2.5 MG tablet [[id number]]  Order Details  Dose: 2.5 mg Route: Oral Frequency: Daily  Indications of Use: Hypertension  Dispense Quantity: 90 tablet Refills: 2 Fills remaining: --        Sig: Take 1 tablet (2.5 mg total) by mouth daily. Stop taking Amlodipine 10mg .     Patient uses:  [facility] [address] ([address] [place] [phone] (Phone) [phone] (Fax)   --glh

## 2017-06-08 NOTE — Telephone Encounter (Signed)
Verapamil was sent in last week. I did send in amlodipine. She needs a follow up appointment next month.

## 2017-06-16 ENCOUNTER — Ambulatory Visit (INDEPENDENT_AMBULATORY_CARE_PROVIDER_SITE_OTHER): Payer: BLUE CROSS/BLUE SHIELD | Admitting: *Deleted

## 2017-06-16 DIAGNOSIS — I639 Cerebral infarction, unspecified: Secondary | ICD-10-CM | POA: Diagnosis not present

## 2017-06-16 NOTE — Progress Notes (Signed)
Carelink Summary Report / Loop Recorder 

## 2017-06-30 ENCOUNTER — Ambulatory Visit (INDEPENDENT_AMBULATORY_CARE_PROVIDER_SITE_OTHER): Payer: Medicare Other | Admitting: Adult Health

## 2017-06-30 ENCOUNTER — Encounter: Payer: Self-pay | Admitting: Adult Health

## 2017-06-30 VITALS — BP 114/68 | HR 78 | Ht 60.75 in | Wt 147.5 lb

## 2017-06-30 DIAGNOSIS — Z0000 Encounter for general adult medical examination without abnormal findings: Secondary | ICD-10-CM

## 2017-06-30 DIAGNOSIS — I639 Cerebral infarction, unspecified: Secondary | ICD-10-CM

## 2017-06-30 DIAGNOSIS — M797 Fibromyalgia: Secondary | ICD-10-CM | POA: Diagnosis not present

## 2017-06-30 DIAGNOSIS — I10 Essential (primary) hypertension: Secondary | ICD-10-CM

## 2017-06-30 DIAGNOSIS — F17200 Nicotine dependence, unspecified, uncomplicated: Secondary | ICD-10-CM | POA: Diagnosis not present

## 2017-06-30 DIAGNOSIS — R7989 Other specified abnormal findings of blood chemistry: Secondary | ICD-10-CM | POA: Insufficient documentation

## 2017-06-30 DIAGNOSIS — H1032 Unspecified acute conjunctivitis, left eye: Secondary | ICD-10-CM | POA: Diagnosis not present

## 2017-06-30 MED ORDER — GABAPENTIN 100 MG PO CAPS: ORAL_CAPSULE | ORAL | 0 refills | Status: DC

## 2017-06-30 MED ORDER — GENTAMICIN SULFATE 0.3 % OP OINT: TOPICAL_OINTMENT | Freq: Three times a day (TID) | OPHTHALMIC | 0 refills | Status: DC

## 2017-06-30 NOTE — Assessment & Plan Note (Signed)
BP at goal 114/68,HR 78 Continue Verapamil 180mg  QD, losartan 100mg  QD, amlodipine 2.5mg  QD

## 2017-06-30 NOTE — Assessment & Plan Note (Signed)
Gent eye gtt's provided.

## 2017-06-30 NOTE — Assessment & Plan Note (Signed)
Start Gabapentin- 1 cap at bedtime for week one, 2 caps at bedtime for week two, 3 caps at bedtime for week three, continue at this dose. Increase water and move as much as tolerated. We will call you when lab results are available. Handicap parking application completed. Follow-up in 4 weeks, re: Gabapentin.

## 2017-06-30 NOTE — Patient Instructions (Signed)
Myofascial Pain Syndrome and Fibromyalgia Myofascial pain syndrome and fibromyalgia are both pain disorders. This pain may be felt mainly in your muscles.  Myofascial pain syndrome: ? Always has trigger points or tender points in the muscle that will cause pain when pressed. The pain may come and go. ? Usually affects your neck, upper back, and shoulder areas. The pain often radiates into your arms and hands.  Fibromyalgia: ? Has muscle pains and tenderness that come and go. ? Is often associated with fatigue and sleep disturbances. ? Has trigger points. ? Tends to be long-lasting (chronic), but is not life-threatening.  Fibromyalgia and myofascial pain are not the same. However, they often occur together. If you have both conditions, each can make the other worse. Both are common and can cause enough pain and fatigue to make day-to-day activities difficult. What are the causes? The exact causes of fibromyalgia and myofascial pain are not known. People with certain gene types may be more likely to develop fibromyalgia. Some factors can be triggers for both conditions, such as:  Spine disorders.  Arthritis.  Severe injury (trauma) and other physical stressors.  Being under a lot of stress.  A medical illness.  What are the signs or symptoms? Fibromyalgia The main symptom of fibromyalgia is widespread pain and tenderness in your muscles. This can vary over time. Pain is sometimes described as stabbing, shooting, or burning. You may have tingling or numbness, too. You may also have sleep problems and fatigue. You may wake up feeling tired and groggy (fibro fog). Other symptoms may include:  Bowel and bladder problems.  Headaches.  Visual problems.  Problems with odors and noises.  Depression or mood changes.  Painful menstrual periods (dysmenorrhea).  Dry skin or eyes.  Myofascial pain syndrome Symptoms of myofascial pain syndrome include:  Tight, ropy bands of  muscle.  Uncomfortable sensations in muscular areas, such as: ? Aching. ? Cramping. ? Burning. ? Numbness. ? Tingling. ? Muscle weakness.  Trouble moving certain muscles freely (range of motion).  How is this diagnosed? There are no specific tests to diagnose fibromyalgia or myofascial pain syndrome. Both can be hard to diagnose because their symptoms are common in many other conditions. Your health care provider may suspect one or both of these conditions based on your symptoms and medical history. Your health care provider will also do a physical exam. The key to diagnosing fibromyalgia is having pain, fatigue, and other symptoms for more than three months that cannot be explained by another condition. The key to diagnosing myofascial pain syndrome is finding trigger points in muscles that are tender and cause pain elsewhere in your body (referred pain). How is this treated? Treating fibromyalgia and myofascial pain often requires a team of health care providers. This usually starts with your primary provider and a physical therapist. You may also find it helpful to work with alternative health care providers, such as massage therapists or acupuncturists. Treatment for fibromyalgia may include medicines. This may include nonsteroidal anti-inflammatory drugs (NSAIDs), along with other medicines. Treatment for myofascial pain may also include:  NSAIDs.  Cooling and stretching of muscles.  Trigger point injections.  Sound wave (ultrasound) treatments to stimulate muscles.  Follow these instructions at home:  Take medicines only as directed by your health care provider.  Exercise as directed by your health care provider or physical therapist.  Try to avoid stressful situations.  Practice relaxation techniques to control your stress. You may want to try: ? Biofeedback. ? Visual   imagery. ? Hypnosis. ? Muscle relaxation. ? Yoga. ? Meditation.  Talk to your health care provider  about alternative treatments, such as acupuncture or massage treatment.  Maintain a healthy lifestyle. This includes eating a healthy diet and getting enough sleep.  Consider joining a support group.  Do not do activities that stress or strain your muscles. That includes repetitive motions and heavy lifting. Where to find more information:  National Fibromyalgia Association: www.fmaware.[place]: www.arthritis.org  American Chronic Pain Association: OEMDeals.dk Contact a health care provider if:  You have new symptoms.  Your symptoms get worse.  You have side effects from your medicines.  You have trouble sleeping.  Your condition is causing depression or anxiety. This information is not intended to replace advice given to you by your health care provider. Make sure you discuss any questions you have with your health care provider. Document Released: 04/14/2005 Document Revised: 09/20/2015 Document Reviewed: 01/18/2014 Elsevier Interactive Patient Education  2018 [place] all medications as directed, with new addition- Start Gabapentin- 1 cap at bedtime for week one, 2 caps at bedtime for week two, 3 caps at bedtime for week three, continue at this dose. Increase water and move as much as tolerated. We will call you when lab results are available. Handicap parking application completed. Follow-up in 4 weeks, re: Gabapentin. NICE TO SEE YOU!

## 2017-06-30 NOTE — Assessment & Plan Note (Addendum)
Continue all medications as directed, with new addition F/u 4 weeks, re: started on gabapentin for fibromyalgia and increased RLS CMP drawn today, re: low creat

## 2017-06-30 NOTE — Progress Notes (Signed)
Subjective:    Patient ID: Heidi Foster, female    DOB: 04/24/1956, 62 y.o.   MRN: [id number] 01/22/2017 OV Notes: HPI:  Heidi Foster is here for CPE: PMH: HTN, GERD, Dysphagia, HL, tobacco use disorder,and  Fatigue.  10/08/16:Transnasal Laryngoscopy, impression: Bilateral vocal cord polyps.    11/13/16:She has MICROLARYNGOSCOPY WITH LASER.  01/12/17: Pulmonology visit, records reviewed and sig points: Centrilobular emphysema, started on Trelegy in place of [place].  Smoking cessation discussed and she is self-reducing tobacco use. LD CHEST CT W/O 08/01/16 (per radiologist):  Lung-RADS Category 1, negative. Repeat CT in 1 year. Borderline ascending aortic aneurysm. Mild centrilobular emphysema.  Instructed to f/u in 3 months.  01/14/17: GYN visit: PAP completed.  Fibrocystic Breasts L>R, she has f/u L dx mammogram on 02/04/17 F/u with GYN 02/04/17  She is down to < half/pack cigarettes/day-GREAT. She continues to have dyspnea and reports that she can only ambulate 200 yards without needing to stop/rest. She also reports increase in lumbar back pain and bil lower extremity pain and tingling. She cannot exercise or work due to "my breathing and pain and I am waiting on my disability claim".   She denies anxiety/depression or thoughts of harming herself/others. She continues to stressed that she is completely financially dependent on her son and daughter-in-law.  Healthcare Maintenance: PAP-completed last week Mammogram- completed in Spring and f/u imaging scheduled 02/04/17 CT- completed 07/2016, repeat in 1 year Colonoscopy- has never had one and order has been placed.  03/09/2017 Notes: Heidi Foster is here for f/u: treatment of Fibromyalgia with Duloxetine 30mg  BID.  She reports reduction of overall pain from 10/10 to 7/10.  She continues to walk as often as possible and even stretches daily.  She drinks 3-4 16 oz bottles of water and is still trying to reduce tobacco use, smoking 1/2 pack /day.  Last  Thursday 03/05/17, she experienced bil tingling in hands/fingers, and then briefly had change in vision described as "small areas of black spots" and mild dizziness that all self resolved within an hr.  She continues to have intermittent L hand/fingers numbness/tingling-none at present. Yesterday morning, while making her bed she "felt woozie and all of a sudden very tired" that resolved by mid-morning.  She had TIA/MI in early 2000s and has never ben seen by Neurologist. BP remains stable and she does not have Hx of Diabetes. She denies any acute neurological sx's during OV.  04/01/17 OV: Heidi Foster is here for hosp f/u 03/15/17- 03/17/17:    MR Brain 03/15/17-  IMPRESSION: 1. Acute/subacute infarcts involving the high posterior right sylvian fissure, right frontal operculum, and right parietal white matter. This corresponds with the patient's left-sided hand and finger numbness. 2. Other periventricular and subcortical white matter changes bilaterally are moderately advanced for age and likely reflect the sequela of chronic microvascular ischemia.  CTA head 03/15/17: 1. Small M3 branch vessel thromboses of the right distal MCA superior division in the region of acute infarctions. 2. 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 Early branching right MCA. 3. 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 4. Otherwise no additional intracranial large vessel occlusion, aneurysm, or significant stenosis.  She had her Implanted Loop Recorder was interrogated yesterday and she denies Cp/dyspnea/HA/dizziness/parethesia/palpitations/changes in vision. She reports medication compliance and denies SE. She has not used tobacco since 03/15/17- WONDERFUL! She has been safely ambulating with walker and been slowly rebuilding her strength with home exercises. Only  acute complaint- insomnia, she denies using the Trazodone 50 mg that was provided at d/c. We will  obtain BMP, CBC today   06/30/17 OV: Heidi Foster presents for regular f/u: HTN, GERD, HL, tobacco use disorder, fibromyalgia and worsening restless leg syndrome. She reports sig difficulty walking >100 yards and has been using walker/cane more often. She remains off tobacco-GREAT! She requests handicap parking permit today. CMP re-checked today She reports OS excessive watering and crusted shut the last few days.   Patient Care Team    Relationship Specialty Notifications Start End  Esaw Grandchild, NP PCP - General Family Medicine  05/26/16     Patient Active Problem List   Diagnosis Date Noted  . Low serum creatine 06/30/2017  . Acute bacterial conjunctivitis of left eye 06/30/2017  . Insomnia 04/01/2017  . Cerebral aneurysm   . Stroke ([place]) 03/15/2017  . Paresthesia of both hands 03/09/2017  . Hx of transient ischemic attack (TIA) 03/09/2017  . Fibromyalgia 01/22/2017  . Breast pain, left 12/15/2016  . Generalized weakness 12/15/2016  . Leg pain, bilateral 12/15/2016  . 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 Centrilobular emphysema ([place]) 09/09/2016  . GERD (gastroesophageal reflux disease) 06/30/2016  . Dysphagia 06/30/2016  . Health care maintenance 06/25/2016  . Other fatigue 05/26/2016  . Hypertension 05/26/2016  . Other hyperlipidemia 05/26/2016  . Family history of diabetes mellitus in mother 05/26/2016  . Acute pain of right shoulder 05/26/2016  . Tobacco use disorder 05/26/2016  . Dyspnea on exertion 05/26/2016  . Chronic pain of right knee 05/26/2016  . History of MI (myocardial infarction) 05/26/2016  . History of TIA (transient ischemic attack) 05/26/2016  . Numbness and tingling of left lower extremity 05/26/2016  . Numbness and tingling in left hand 05/26/2016  . 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 Allergy   . Anginal pain ([place])   . Arthritis   . 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 Closed fracture of right patella   .  Emphysema of lung ([place])   . GERD (gastroesophageal reflux disease)   . Heart attack ([place]) 1999  . Hypertension   . Osteopenia 12/2016   T score -1.2 FRAX 7.4%/0.8%  . Seborrheic keratoses   . Stroke ([place])    TIA x 2, early 2000's   . Vocal cord polyp      Past Surgical History:  Procedure Laterality Date  . BREAST BIOPSY    . BREAST SURGERY     Left Breast nodule markers  . CARDIAC CATHETERIZATION     1999  . caridac cath    . LOOP RECORDER INSERTION N/A 03/17/2017   Procedure: LOOP RECORDER INSERTION;  Surgeon: Evans Lance, MD;  Location: [facility];  Service: Cardiovascular;  Laterality: N/A;  . lt. ovary removed    . MICROLARYNGOSCOPY WITH LASER Bilateral 11/13/2016   Procedure: MICROLARYNGOSCOPY;  Surgeon: Melissa Montane, MD;  Location: [facility] OR;  Service: ENT;  Laterality: Bilateral;  . polyp removal  10/2016   throat -   . SKIN BIOPSY     nose  . TEE WITHOUT CARDIOVERSION N/A 03/17/2017   Procedure: TRANSESOPHAGEAL ECHOCARDIOGRAM (TEE);  Surgeon: Dorothy Spark, MD;  Location: [facility] ENDOSCOPY;  Service: Cardiovascular;  Laterality: N/A;  . TUBAL LIGATION       Family History  Problem Relation Age of Onset  . Diabetes Mother   . Hypertension Mother   . Stroke  Mother   . Alcohol abuse Father   . Multiple sclerosis Sister   . Cancer Brother        lung  . Stroke Maternal Grandmother   . Heart attack Maternal Grandmother   . Hypertension Maternal Grandmother   . Colon cancer Neg Hx   . Colon polyps Neg Hx   . Esophageal cancer Neg Hx   . Stomach cancer Neg Hx   . Rectal cancer Neg Hx      Social History   Substance and Sexual Activity  Drug Use No     Social History   Substance and Sexual Activity  Alcohol Use No     Social History   Tobacco Use  Smoking Status Current Every Day Smoker  . Packs/day: 0.50  . Years: 40.00  . Pack years: 20.00  . Types: Cigarettes  . Start date: 09/27/1976  Smokeless Tobacco Never Used  Tobacco  Comment   Peak rate of 1ppd     Outpatient Encounter Medications as of 06/30/2017  Medication Sig Note  . albuterol (PROVENTIL HFA;VENTOLIN HFA) 108 (90 Base) MCG/ACT inhaler Inhale 2 puffs into the lungs every 4 (four) hours as needed for wheezing or shortness of breath.   Marland Kitchen amLODipine (NORVASC) 2.5 MG tablet Take 1 tablet (2.5 mg total) by mouth daily.   Marland Kitchen atorvastatin (LIPITOR) 40 MG tablet Take 1 tablet (40 mg total) by mouth daily at 6 PM.   . clopidogrel (PLAVIX) 75 MG tablet Take 1 tablet (75 mg total) by mouth daily.   . DULoxetine (CYMBALTA) 30 MG capsule 1 tablet daily for first week, then increase to two tabs once daily. (Patient taking differently: Take 30 mg 2 (two) times daily by mouth. ) 03/15/2017: Pt reports taking 1 tablet twice daily for past month  . losartan (COZAAR) 100 MG tablet TAKE 1 TABLET BY MOUTH ONCE DAILY   . Multiple Vitamin (MULTIVITAMIN WITH MINERALS) TABS tablet Take 1 tablet daily by mouth.   . Spacer/Aero-Holding Chambers (AEROCHAMBER MV) inhaler Use as instructed   . traZODone (DESYREL) 50 MG tablet Take 1 tablet (50 mg total) by mouth at bedtime as needed for sleep.   Marland Kitchen umeclidinium-vilanterol (ANORO ELLIPTA) 62.5-25 MCG/INH AEPB Inhale 1 puff daily into the lungs.   Marland Kitchen UNABLE TO FIND Med Name: Topricin Pain Relief Cream, Topricin Fibro Cream, Topricin Foot Therapy Cream   . verapamil (VERELAN PM) 180 MG 24 hr capsule TAKE 1 CAPSULE BY MOUTH ONCE DAILY   . gabapentin (NEURONTIN) 100 MG capsule 1 cap at bedtime for week one, 2 caps at bedtime for week two, 3 caps at bedtime for week three, continue at this dose.   Marland Kitchen gentamicin (GARAMYCIN) 0.3 % ophthalmic ointment Place into the left eye 3 (three) times daily.    No facility-administered encounter medications on file as of 06/30/2017.     Allergies: Codeine and Bupropion  Body mass index is 28.1 kg/m.  Blood pressure 114/68, pulse 78, height 5' 0.75" (1.543 m), weight 147 lb 8 oz (66.9 kg), SpO2 96  %.   Review of Systems  Constitutional: Positive for fatigue. Negative for activity change, appetite change, chills, diaphoresis and fever.  HENT: Positive for voice change. Negative for congestion, postnasal drip and trouble swallowing.   Eyes: Negative for visual disturbance.  Respiratory: Positive for cough and shortness of breath. Negative for chest tightness, wheezing and stridor.   Cardiovascular: Negative for chest pain, palpitations and leg swelling.  Gastrointestinal: Negative for abdominal distention, abdominal pain,  blood in stool, constipation, nausea and vomiting.  Endocrine: Negative for cold intolerance, heat intolerance, polydipsia, polyphagia and polyuria.  Genitourinary: Negative for flank pain and hematuria.  Musculoskeletal: Positive for arthralgias, back pain, gait problem, joint swelling, myalgias, neck pain and neck stiffness.  Skin: Negative for color change, pallor, rash and wound.  Neurological: Negative for dizziness, weakness and headaches.  Hematological: Bruises/bleeds easily.  Psychiatric/Behavioral: Positive for sleep disturbance.       4 pillow orthopnea       Objective:   Physical Exam  Constitutional: She is oriented to person, place, and time. She appears well-developed and well-nourished. No distress.  HENT:  Head: Normocephalic and atraumatic.  Right Ear: Tympanic membrane and ear canal normal. Decreased hearing is noted.  Left Ear: Tympanic membrane and ear canal normal. Decreased hearing is noted.  Mouth/Throat: Mucous membranes are normal. Abnormal dentition. Dental caries present.  Eyes: Conjunctivae are normal. Pupils are equal, round, and reactive to light.  OS- redness and profuse watering  Neck: Normal range of motion.  Cardiovascular: Normal rate, normal heart sounds and intact distal pulses.  No murmur heard. Pulmonary/Chest: Effort normal. No respiratory distress. She has decreased breath sounds in the right lower field and the left  lower field. She has no wheezes. She has no rhonchi. She has no rales. She exhibits no tenderness. Right breast exhibits no inverted nipple, no mass, no nipple discharge and no skin change. Left breast exhibits no inverted nipple, no nipple discharge and no skin change.  Abdominal: Soft. Bowel sounds are normal. She exhibits no distension and no mass. There is no tenderness. There is no rebound and no guarding.  Musculoskeletal: She exhibits tenderness. She exhibits no deformity.       Right hip: She exhibits tenderness.       Left hip: She exhibits tenderness.       Right knee: Tenderness found.       Left knee: Tenderness found.       Right ankle: Tenderness.       Left ankle: Tenderness.       Thoracic back: She exhibits tenderness.       Lumbar back: She exhibits tenderness.  Lymphadenopathy:    She has no cervical adenopathy.  Neurological: She is alert and oriented to person, place, and time. No cranial nerve deficit. Coordination normal.  Skin: Skin is warm and dry. No rash noted. She is not diaphoretic. No erythema. No pallor.  Psychiatric: She has a normal mood and affect. Her behavior is normal. Judgment and thought content normal.  Nursing note and vitals reviewed.         Assessment & Plan:   1. Essential hypertension   2. Low serum creatine   3. Fibromyalgia   4. Health care maintenance   5. Tobacco use disorder   6. Acute bacterial conjunctivitis of left eye     Fibromyalgia Start Gabapentin- 1 cap at bedtime for week one, 2 caps at bedtime for week two, 3 caps at bedtime for week three, continue at this dose. Increase water and move as much as tolerated. We will call you when lab results are available. Handicap parking application completed. Follow-up in 4 weeks, re: Gabapentin.  Health care maintenance Continue all medications as directed, with new addition F/u 4 weeks, re: started on gabapentin for fibromyalgia and increased RLS CMP drawn today, re: low  creat  Tobacco use disorder She remains off tobacco- GREAT!  Acute bacterial conjunctivitis of left eye Gent eye  gtt's provided.  Hypertension BP at goal 114/68,HR 78 Continue Verapamil 180mg  QD, losartan 100mg  QD, amlodipine 2.5mg  QD   Pt was in the office today for 25+ minutes, with over 50% time spent in face to face counseling of patient's various medical conditions and in coordination of care  FOLLOW-UP:  Return in about 4 weeks (around 07/28/2017) for Regular Follow Up, Evaluate Medication Effectiveness.

## 2017-06-30 NOTE — Assessment & Plan Note (Signed)
She remains off tobacco- GREAT!

## 2017-07-01 ENCOUNTER — Telehealth: Payer: Self-pay

## 2017-07-01 LAB — COMPREHENSIVE METABOLIC PANEL
ALT: 15 IU/L (ref 0–32)
AST: 17 IU/L (ref 0–40)
Albumin/Globulin Ratio: 1.6 (ref 1.2–2.2)
Albumin: 4.1 g/dL (ref 3.6–4.8)
Alkaline Phosphatase: 104 IU/L (ref 39–117)
BILIRUBIN TOTAL: 0.3 mg/dL (ref 0.0–1.2)
BUN/Creatinine Ratio: 21 (ref 12–28)
BUN: 17 mg/dL (ref 8–27)
CALCIUM: 10.1 mg/dL (ref 8.7–10.3)
CHLORIDE: 103 mmol/L (ref 96–106)
CO2: 24 mmol/L (ref 20–29)
Creatinine, Ser: 0.82 mg/dL (ref 0.57–1.00)
GFR, EST AFRICAN AMERICAN: 90 mL/min/{1.73_m2} (ref >59)
GFR, EST NON AFRICAN AMERICAN: 78 mL/min/{1.73_m2} (ref >59)
GLUCOSE: 117 mg/dL — AB (ref 65–99)
Globulin, Total: 2.6 g/dL (ref 1.5–4.5)
POTASSIUM: 3.8 mmol/L (ref 3.5–5.2)
Sodium: 142 mmol/L (ref 134–144)
TOTAL PROTEIN: 6.7 g/dL (ref 6.0–8.5)

## 2017-07-01 MED ORDER — CIPROFLOXACIN HCL 0.3 % OP SOLN: OPHTHALMIC | 0 refills | Status: DC

## 2017-07-01 NOTE — Telephone Encounter (Signed)
Please change to  Cipro HCL 0.3% ophthal solution 1-2 gtts in affected eye every 2 hrs while awake 2.53ml bottle No RF Thanks! Valetta Fuller

## 2017-07-01 NOTE — Telephone Encounter (Signed)
Received fax from pharmacy stating that [facility] ointment is on back order.  Pharmacy requests therapy change.  Charyl Bigger, CMA

## 2017-07-20 ENCOUNTER — Ambulatory Visit (INDEPENDENT_AMBULATORY_CARE_PROVIDER_SITE_OTHER): Payer: Medicare Other | Admitting: *Deleted

## 2017-07-20 ENCOUNTER — Ambulatory Visit: Admission: RE | Admit: 2017-07-20 | Payer: BLUE CROSS/BLUE SHIELD | Source: Ambulatory Visit

## 2017-07-20 DIAGNOSIS — I639 Cerebral infarction, unspecified: Secondary | ICD-10-CM

## 2017-07-20 LAB — CUP PACEART REMOTE DEVICE CHECK
Implantable Pulse Generator Implant Date: 20181120
MDC IDC SESS DTM: 20190219150121

## 2017-07-20 NOTE — Progress Notes (Signed)
Carelink Summary Report / Loop Recorder 

## 2017-07-21 NOTE — Progress Notes (Signed)
Subjective:    Patient ID: Heidi Foster, female    DOB: 07/04/1955, 62 y.o.   MRN: [id number]  HPI:  Heidi Foster presents today for f/u for Gabapentin titration to treat fibromyalgia and RLS She is currently on QHS 300mg  and reports sig reduction in generalized pain and RLS, she states "I am sleeping much better and feel like I have more energy". She has slowly increasing daily walking and building her strength. She continues to abstain from tobacco use.  Patient Care Team    Relationship Specialty Notifications Start End  Esaw Grandchild, NP PCP - General Family Medicine  05/26/16     Patient Active Problem List   Diagnosis Date Noted  . Restless leg syndrome 07/28/2017  . Low serum creatine 06/30/2017  . Acute bacterial conjunctivitis of left eye 06/30/2017  . Insomnia 04/01/2017  . Cerebral aneurysm   . Stroke ([place]) 03/15/2017  . Paresthesia of both hands 03/09/2017  . Hx of transient ischemic attack (TIA) 03/09/2017  . Fibromyalgia 01/22/2017  . Breast pain, left 12/15/2016  . Generalized weakness 12/15/2016  . Leg pain, bilateral 12/15/2016  . 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 Centrilobular emphysema ([place]) 09/09/2016  . GERD (gastroesophageal reflux disease) 06/30/2016  . Dysphagia 06/30/2016  . Health care maintenance 06/25/2016  . Other fatigue 05/26/2016  . Hypertension 05/26/2016  . Other hyperlipidemia 05/26/2016  . Family history of diabetes mellitus in mother 05/26/2016  . Acute pain of right shoulder 05/26/2016  . Tobacco use disorder 05/26/2016  . Dyspnea on exertion 05/26/2016  . Chronic pain of right knee 05/26/2016  . History of MI (myocardial infarction) 05/26/2016  . History of TIA (transient ischemic attack) 05/26/2016  . Numbness and tingling of left lower extremity 05/26/2016  . Numbness and tingling in left hand 05/26/2016  . 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 Allergy   . Anginal pain ([place])   . Arthritis   .  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 Closed fracture of right patella   . Emphysema of lung ([place])   . GERD (gastroesophageal reflux disease)   . Heart attack ([place]) 1999  . Hypertension   . Osteopenia 12/2016   T score -1.2 FRAX 7.4%/0.8%  . Seborrheic keratoses   . Stroke ([place])    TIA x 2, early 2000's   . Vocal cord polyp      Past Surgical History:  Procedure Laterality Date  . BREAST BIOPSY    . BREAST SURGERY     Left Breast nodule markers  . CARDIAC CATHETERIZATION     1999  . caridac cath    . LOOP RECORDER INSERTION N/A 03/17/2017   Procedure: LOOP RECORDER INSERTION;  Surgeon: Evans Lance, MD;  Location: [place];  Service: Cardiovascular;  Laterality: N/A;  . lt. ovary removed    . MICROLARYNGOSCOPY WITH LASER Bilateral 11/13/2016   Procedure: MICROLARYNGOSCOPY;  Surgeon: Melissa Montane, MD;  Location: [facility] OR;  Service: ENT;  Laterality: Bilateral;  . polyp removal  10/2016   throat -   . SKIN BIOPSY     nose  . TEE WITHOUT CARDIOVERSION N/A 03/17/2017   Procedure: TRANSESOPHAGEAL ECHOCARDIOGRAM (TEE);  Surgeon: Dorothy Spark, MD;  Location: [facility] ENDOSCOPY;  Service: Cardiovascular;  Laterality: N/A;  . TUBAL LIGATION       Family History  Problem Relation Age of Onset  . Diabetes Mother   .  Hypertension Mother   . Stroke Mother   . Alcohol abuse Father   . Multiple sclerosis Sister   . Cancer Brother        lung  . Stroke Maternal Grandmother   . Heart attack Maternal Grandmother   . Hypertension Maternal Grandmother   . Colon cancer Neg Hx   . Colon polyps Neg Hx   . Esophageal cancer Neg Hx   . Stomach cancer Neg Hx   . Rectal cancer Neg Hx      Social History   Substance and Sexual Activity  Drug Use No     Social History   Substance and Sexual Activity  Alcohol Use No     Social History   Tobacco Use  Smoking Status Current Every Day Smoker  . Packs/day: 0.50  . Years: 40.00  . Pack years: 20.00  . Types: Cigarettes   . Start date: 09/27/1976  Smokeless Tobacco Never Used  Tobacco Comment   Peak rate of 1ppd     Outpatient Encounter Medications as of 07/28/2017  Medication Sig Note  . albuterol (PROVENTIL HFA;VENTOLIN HFA) 108 (90 Base) MCG/ACT inhaler Inhale 2 puffs into the lungs every 4 (four) hours as needed for wheezing or shortness of breath.   Marland Kitchen amLODipine (NORVASC) 2.5 MG tablet Take 1 tablet (2.5 mg total) by mouth daily.   Marland Kitchen atorvastatin (LIPITOR) 40 MG tablet Take 1 tablet (40 mg total) by mouth daily at 6 PM.   . ciprofloxacin (CILOXAN) 0.3 % ophthalmic solution Administer 1-2 drops  to affected eye every 2 hours while awake   . clopidogrel (PLAVIX) 75 MG tablet Take 1 tablet (75 mg total) by mouth daily.   . DULoxetine (CYMBALTA) 30 MG capsule 1 tablet daily for first week, then increase to two tabs once daily. (Patient taking differently: Take 30 mg 2 (two) times daily by mouth. ) 03/15/2017: Pt reports taking 1 tablet twice daily for past month  . gabapentin (NEURONTIN) 300 MG capsule 1 cap at bedtime   . gentamicin (GARAMYCIN) 0.3 % ophthalmic ointment Place into the left eye 3 (three) times daily.   Marland Kitchen losartan (COZAAR) 100 MG tablet TAKE 1 TABLET BY MOUTH ONCE DAILY   . Multiple Vitamin (MULTIVITAMIN WITH MINERALS) TABS tablet Take 1 tablet daily by mouth.   . Spacer/Aero-Holding Chambers (AEROCHAMBER MV) inhaler Use as instructed   . traZODone (DESYREL) 50 MG tablet Take 1 tablet (50 mg total) by mouth at bedtime as needed for sleep.   Marland Kitchen umeclidinium-vilanterol (ANORO ELLIPTA) 62.5-25 MCG/INH AEPB Inhale 1 puff daily into the lungs.   Marland Kitchen UNABLE TO FIND Med Name: Topricin Pain Relief Cream, Topricin Fibro Cream, Topricin Foot Therapy Cream   . verapamil (VERELAN PM) 180 MG 24 hr capsule TAKE 1 CAPSULE BY MOUTH ONCE DAILY   . [DISCONTINUED] gabapentin (NEURONTIN) 100 MG capsule 1 cap at bedtime for week one, 2 caps at bedtime for week two, 3 caps at bedtime for week three, continue at this  dose.    No facility-administered encounter medications on file as of 07/28/2017.     Allergies: Codeine and Bupropion  Body mass index is 28.29 kg/m.  Blood pressure 126/71, pulse 64, height 5' 0.75" (1.543 m), weight 148 lb 8 oz (67.4 kg), SpO2 95 %.  Review of Systems  Constitutional: Negative for activity change, appetite change, chills, diaphoresis, fatigue, fever and unexpected weight change.  Eyes: Negative for visual disturbance.  Respiratory: Negative for cough, chest tightness, shortness of breath,  wheezing and stridor.   Cardiovascular: Negative for chest pain, palpitations and leg swelling.  Neurological: Negative for dizziness and headaches.  Hematological: Does not bruise/bleed easily.  Psychiatric/Behavioral: Negative for decreased concentration, dysphoric mood, hallucinations, self-injury, sleep disturbance and suicidal ideas. The patient is not nervous/anxious and is not hyperactive.        Objective:   Physical Exam  Constitutional: She is oriented to person, place, and time. She appears well-developed and well-nourished. No distress.  HENT:  Head: Normocephalic and atraumatic.  Right Ear: External ear normal.  Left Ear: External ear normal.  Eyes: Pupils are equal, round, and reactive to light. Conjunctivae are normal.  Cardiovascular: Normal rate, regular rhythm, normal heart sounds and intact distal pulses.  No murmur heard. Pulmonary/Chest: Effort normal and breath sounds normal. No respiratory distress. She has no wheezes. She has no rales. She exhibits no tenderness.  Neurological: She is alert and oriented to person, place, and time.  Skin: Skin is warm and dry. No rash noted. She is not diaphoretic. No erythema. No pallor.  Psychiatric: She has a normal mood and affect. Her behavior is normal. Judgment and thought content normal.  Nursing note and vitals reviewed.         Assessment & Plan:   1. Fibromyalgia   2. Restless leg syndrome      Fibromyalgia Titrated up to Gabapentin 300mg  QHS Refill sent in She denies sedation or other SE  Restless leg syndrome Titrated up to Gabapentin 300mg  QHS Refill sent in She denies sedation or other SE    FOLLOW-UP:  Return in about 6 months (around 01/27/2018) for Regular Follow Up.

## 2017-07-28 ENCOUNTER — Encounter: Payer: Self-pay | Admitting: Adult Health

## 2017-07-28 ENCOUNTER — Ambulatory Visit (INDEPENDENT_AMBULATORY_CARE_PROVIDER_SITE_OTHER): Payer: Medicare Other | Admitting: Adult Health

## 2017-07-28 VITALS — BP 126/71 | HR 64 | Ht 60.75 in | Wt 148.5 lb

## 2017-07-28 DIAGNOSIS — I639 Cerebral infarction, unspecified: Secondary | ICD-10-CM

## 2017-07-28 DIAGNOSIS — M797 Fibromyalgia: Secondary | ICD-10-CM

## 2017-07-28 DIAGNOSIS — G2581 Restless legs syndrome: Secondary | ICD-10-CM | POA: Diagnosis not present

## 2017-07-28 MED ORDER — GABAPENTIN 300 MG PO CAPS: ORAL_CAPSULE | ORAL | 3 refills | Status: DC

## 2017-07-28 NOTE — Assessment & Plan Note (Signed)
Titrated up to Gabapentin 300mg  QHS Refill sent in She denies sedation or other SE

## 2017-07-28 NOTE — Patient Instructions (Signed)
Restless Legs Syndrome Restless legs syndrome is a condition that causes uncomfortable feelings or sensations in the legs, especially while sitting or lying down. The sensations usually cause an overwhelming urge to move the legs. The arms can also sometimes be affected. The condition can range from mild to severe. The symptoms often interfere with a person's ability to sleep. What are the causes? The cause of this condition is not known. What increases the risk? This condition is more likely to develop in:  People who are older than age 25.  Pregnant women. In general, restless legs syndrome is more common in women than in men.  People who have a family history of the condition.  People who have certain medical conditions, such as iron deficiency, kidney disease, Parkinson disease, or nerve damage.  People who take certain medicines, such as medicines for high blood pressure, nausea, colds, allergies, depression, and some heart conditions.  What are the signs or symptoms? The main symptom of this condition is uncomfortable sensations in the legs. These sensations may be:  Described as pulling, tingling, prickling, throbbing, crawling, or burning.  Worse while you are sitting or lying down.  Worse during periods of rest or inactivity.  Worse at night, often interfering with your sleep.  Accompanied by a very strong urge to move your legs.  Temporarily relieved by movement of your legs.  The sensations usually affect both sides of the body. The arms can also be affected, but this is rare. People who have this condition often have tiredness during the day because of their lack of sleep at night. How is this diagnosed? This condition may be diagnosed based on your description of the symptoms. You may also have tests, including blood tests, to check for other conditions that may lead to your symptoms. In some cases, you may be asked to spend some time in a sleep lab so your sleeping  can be monitored. How is this treated? Treatment for this condition is focused on managing the symptoms. Treatment may include:  Self-help and lifestyle changes.  Medicines.  Follow these instructions at home:  Take medicines only as directed by your health care provider.  Try these methods to get temporary relief from the uncomfortable sensations: ? Massage your legs. ? Walk or stretch. ? Take a cold or hot bath.  Practice good sleep habits. For example, go to bed and get up at the same time every day.  Exercise regularly.  Practice ways of relaxing, such as yoga or meditation.  Avoid caffeine and alcohol.  Do not use any tobacco products, including cigarettes, chewing tobacco, or electronic cigarettes. If you need help quitting, ask your health care provider.  Keep all follow-up visits as directed by your health care provider. This is important. Contact a health care provider if: Your symptoms do not improve with treatment, or they get worse. This information is not intended to replace advice given to you by your health care provider. Make sure you discuss any questions you have with your health care provider. Document Released: 04/04/2002 Document Revised: 09/20/2015 Document Reviewed: 04/10/2014 Elsevier Interactive Patient Education  2018 [place]. Myofascial Pain Syndrome and Fibromyalgia Myofascial pain syndrome and fibromyalgia are both pain disorders. This pain may be felt mainly in your muscles.  Myofascial pain syndrome: ? Always has trigger points or tender points in the muscle that will cause pain when pressed. The pain may come and go. ? Usually affects your neck, upper back, and shoulder areas. The pain  often radiates into your arms and hands.  Fibromyalgia: ? Has muscle pains and tenderness that come and go. ? Is often associated with fatigue and sleep disturbances. ? Has trigger points. ? Tends to be long-lasting (chronic), but is not  life-threatening.  Fibromyalgia and myofascial pain are not the same. However, they often occur together. If you have both conditions, each can make the other worse. Both are common and can cause enough pain and fatigue to make day-to-day activities difficult. What are the causes? The exact causes of fibromyalgia and myofascial pain are not known. People with certain gene types may be more likely to develop fibromyalgia. Some factors can be triggers for both conditions, such as:  Spine disorders.  Arthritis.  Severe injury (trauma) and other physical stressors.  Being under a lot of stress.  A medical illness.  What are the signs or symptoms? Fibromyalgia The main symptom of fibromyalgia is widespread pain and tenderness in your muscles. This can vary over time. Pain is sometimes described as stabbing, shooting, or burning. You may have tingling or numbness, too. You may also have sleep problems and fatigue. You may wake up feeling tired and groggy (fibro fog). Other symptoms may include:  Bowel and bladder problems.  Headaches.  Visual problems.  Problems with odors and noises.  Depression or mood changes.  Painful menstrual periods (dysmenorrhea).  Dry skin or eyes.  Myofascial pain syndrome Symptoms of myofascial pain syndrome include:  Tight, ropy bands of muscle.  Uncomfortable sensations in muscular areas, such as: ? Aching. ? Cramping. ? Burning. ? Numbness. ? Tingling. ? Muscle weakness.  Trouble moving certain muscles freely (range of motion).  How is this diagnosed? There are no specific tests to diagnose fibromyalgia or myofascial pain syndrome. Both can be hard to diagnose because their symptoms are common in many other conditions. Your health care provider may suspect one or both of these conditions based on your symptoms and medical history. Your health care provider will also do a physical exam. The key to diagnosing fibromyalgia is having pain,  fatigue, and other symptoms for more than three months that cannot be explained by another condition. The key to diagnosing myofascial pain syndrome is finding trigger points in muscles that are tender and cause pain elsewhere in your body (referred pain). How is this treated? Treating fibromyalgia and myofascial pain often requires a team of health care providers. This usually starts with your primary provider and a physical therapist. You may also find it helpful to work with alternative health care providers, such as massage therapists or acupuncturists. Treatment for fibromyalgia may include medicines. This may include nonsteroidal anti-inflammatory drugs (NSAIDs), along with other medicines. Treatment for myofascial pain may also include:  NSAIDs.  Cooling and stretching of muscles.  Trigger point injections.  Sound wave (ultrasound) treatments to stimulate muscles.  Follow these instructions at home:  Take medicines only as directed by your health care provider.  Exercise as directed by your health care provider or physical therapist.  Try to avoid stressful situations.  Practice relaxation techniques to control your stress. You may want to try: ? Biofeedback. ? Visual imagery. ? Hypnosis. ? Muscle relaxation. ? Yoga. ? Meditation.  Talk to your health care provider about alternative treatments, such as acupuncture or massage treatment.  Maintain a healthy lifestyle. This includes eating a healthy diet and getting enough sleep.  Consider joining a support group.  Do not do activities that stress or strain your muscles. That includes repetitive  motions and heavy lifting. Where to find more information:  National Fibromyalgia Association: www.fmaware.[place]: www.arthritis.org  American Chronic Pain Association: [employer]EMDe[employer].dk Contact a health care provider if:  You have new symptoms.  Your symptoms get worse.  You  have side effects from your medicines.  You have trouble sleeping.  Your condition is causing depression or anxiety. This information is not intended to replace advice given to you by your health care provider. Make sure you discuss any questions you have with your health care provider. Document Released: 04/14/2005 Document Revised: 09/20/2015 Document Reviewed: 01/18/2014 Elsevier Interactive Patient Education  2018 [place].  Continue Gabapentin 300mg  nightly- updated rx in system. Continue all other medications as directed. GREAT JOB on avoiding tobacco use! Follow-up 6 months for chronic medical conditions, NICE TO SEE YOU!

## 2017-07-29 ENCOUNTER — Other Ambulatory Visit (INDEPENDENT_AMBULATORY_CARE_PROVIDER_SITE_OTHER): Payer: Medicare Other

## 2017-07-29 ENCOUNTER — Telehealth: Payer: Self-pay | Admitting: Adult Health

## 2017-07-29 DIAGNOSIS — Z1211 Encounter for screening for malignant neoplasm of colon: Secondary | ICD-10-CM

## 2017-07-29 LAB — POC HEMOCCULT BLD/STL (HOME/3-CARD/SCREEN)
Card #2 Fecal Occult Blod, POC: NEGATIVE
Card #3 Fecal Occult Blood, POC: POSITIVE
FECAL OCCULT BLD: NEGATIVE

## 2017-07-29 MED ORDER — DULOXETINE HCL 30 MG PO CPEP: 30.0000 mg | ORAL_CAPSULE | Freq: Two times a day (BID) | ORAL | 0 refills | Status: DC

## 2017-07-29 NOTE — Telephone Encounter (Signed)
Advised pt that amlodipine, losartan and verapamil were just refilled in February, 2019 and that insurance would not pay to have RXs refilled so soon.  RX for duloxetine was sent to pharmacy.  Charyl Bigger, CMA

## 2017-07-29 NOTE — Telephone Encounter (Signed)
Pt called states Ins Coverage changing from [place] to Medicare and she needs these (4 ) Rx refills before changes takes place: 1)- amLODipine (NORVASC) 2.5 MG tablet [[id number]]   Order Details  Dose: 2.5 mg Route: Oral Frequency: Daily  Indications of Use: Hypertension  Dispense Quantity: 90 tablet Refills: 0 Fills remaining: --        Sig: Take 1 tablet (2.5 mg total) by mouth daily     2)- DULoxetine (CYMBALTA) 30 MG capsule [[id number]]   Order Details  Dose, Route, Frequency: As Directed   Indications of Use: Fibromyalgia Syndrome, Musculoskeletal Pain  Dispense Quantity: 180 capsule Refills: 2 Fills remaining: --        Sig: 1 tablet daily for first week, then increase to two tabs once daily.  Patient taking differently: Take 30 mg 2 (two) times daily by mouth.      3)- losartan (COZAAR) 100 MG tablet [[id number]]   Order Details  Dose, Route, Frequency: As Directed   Dispense Quantity: 90 tablet Refills: 0 Fills remaining: --        Sig: TAKE 1 TABLET BY MOUTH ONCE DAILY     4)- verapamil (VERELAN PM) 180 MG 24 hr capsule [[id number]]   Order Details  Dose, Route, Frequency: As Directed   Dispense Quantity: 90 capsule Refills: 0 Fills remaining: --        Sig: TAKE 1 CAPSULE BY MOUTH ONCE DAILY     ----Patient begs that these be call in TODAY if at all possible!!!  Pt uses: [facility] [address] ([address] [place] [phone] (Phone) [phone] (Fa     ----Thanks Baker Janus

## 2017-07-29 NOTE — Progress Notes (Signed)
Patient brought in IFOB today. MPulliam, CMA/RT(R)

## 2017-07-31 ENCOUNTER — Telehealth: Payer: Self-pay

## 2017-07-31 ENCOUNTER — Encounter: Payer: Self-pay | Admitting: Gastroenterology

## 2017-07-31 DIAGNOSIS — R195 Other fecal abnormalities: Secondary | ICD-10-CM

## 2017-07-31 NOTE — Telephone Encounter (Signed)
Spoke with patient regarding positive IFOB.  Pt states that she does not have an established gastroenterologist.  Referral placed for GI.  Charyl Bigger, CMA

## 2017-08-18 ENCOUNTER — Telehealth: Payer: Self-pay | Admitting: Adult Health

## 2017-08-18 NOTE — Telephone Encounter (Signed)
Please advise if this authorized and if she may have any refills.  Charyl Bigger, CMA

## 2017-08-18 NOTE — Telephone Encounter (Signed)
Patient called and sates that she is losing her [employer] coverage at the end of the month when she switches to Medicare and the cost of her meds are going to increase. She is hoping for a large refill request to be sent to [facility] [address] and the refills she is looking for is her amlodipine, losartan, and verapamil. Please Arnoldo Hooker

## 2017-08-18 NOTE — Telephone Encounter (Signed)
Afternoon Tonya, Okay to send in 6 month supply with one refill. She is very compliant and follows-up on regular schedule. Thanks! Valetta Fuller

## 2017-08-19 MED ORDER — AMLODIPINE BESYLATE 2.5 MG PO TABS: 2.5000 mg | ORAL_TABLET | Freq: Every day | ORAL | 0 refills | Status: DC

## 2017-08-19 MED ORDER — LOSARTAN POTASSIUM 100 MG PO TABS: 100.0000 mg | ORAL_TABLET | Freq: Every day | ORAL | 0 refills | Status: DC

## 2017-08-19 MED ORDER — VERAPAMIL HCL ER 180 MG PO CP24: 180.0000 mg | ORAL_CAPSULE | Freq: Every day | ORAL | 0 refills | Status: DC

## 2017-08-19 NOTE — Telephone Encounter (Signed)
Pt informed that RXs were sent to pharmacy.  Charyl Bigger, CMA

## 2017-08-19 NOTE — Addendum Note (Signed)
Addended by: Fonnie Mu on: 08/19/2017 10:15 AM   Modules accepted: Orders

## 2017-08-21 ENCOUNTER — Ambulatory Visit (INDEPENDENT_AMBULATORY_CARE_PROVIDER_SITE_OTHER): Payer: Medicare Other | Admitting: *Deleted

## 2017-08-21 DIAGNOSIS — I639 Cerebral infarction, unspecified: Secondary | ICD-10-CM

## 2017-08-21 NOTE — Progress Notes (Signed)
Carelink Summary Report / Loop Recorder 

## 2017-08-26 LAB — CUP PACEART REMOTE DEVICE CHECK
Implantable Pulse Generator Implant Date: 20181120
MDC IDC SESS DTM: 20190324144004

## 2017-09-15 ENCOUNTER — Ambulatory Visit: Payer: Medicare Other | Admitting: Gastroenterology

## 2017-09-15 LAB — CUP PACEART REMOTE DEVICE CHECK
Implantable Pulse Generator Implant Date: 20181120
MDC IDC SESS DTM: 20190426153814

## 2017-09-23 ENCOUNTER — Ambulatory Visit (INDEPENDENT_AMBULATORY_CARE_PROVIDER_SITE_OTHER): Payer: Medicare Other | Admitting: *Deleted

## 2017-09-23 DIAGNOSIS — I639 Cerebral infarction, unspecified: Secondary | ICD-10-CM | POA: Diagnosis not present

## 2017-09-25 NOTE — Progress Notes (Signed)
Carelink Summary Report / Loop Recorder 

## 2017-10-19 LAB — CUP PACEART REMOTE DEVICE CHECK
Date Time Interrogation Session: 20190529160655
MDC IDC PG IMPLANT DT: 20181120

## 2017-10-26 ENCOUNTER — Encounter: Payer: Self-pay | Admitting: Family Medicine

## 2017-10-26 ENCOUNTER — Ambulatory Visit (INDEPENDENT_AMBULATORY_CARE_PROVIDER_SITE_OTHER): Payer: Medicare Other | Admitting: Family Medicine

## 2017-10-26 ENCOUNTER — Ambulatory Visit (INDEPENDENT_AMBULATORY_CARE_PROVIDER_SITE_OTHER): Payer: Medicare Other | Admitting: *Deleted

## 2017-10-26 VITALS — BP 131/74 | HR 75 | Ht 60.75 in | Wt 154.3 lb

## 2017-10-26 DIAGNOSIS — M25561 Pain in right knee: Secondary | ICD-10-CM

## 2017-10-26 DIAGNOSIS — M25469 Effusion, unspecified knee: Secondary | ICD-10-CM | POA: Diagnosis not present

## 2017-10-26 DIAGNOSIS — I639 Cerebral infarction, unspecified: Secondary | ICD-10-CM

## 2017-10-26 NOTE — Patient Instructions (Signed)
Patellofemoral Pain Syndrome Patellofemoral pain syndrome is a condition that involves a softening or breakdown of the tissue (cartilage) on the underside of your kneecap (patella). This causes pain in the front of the knee. The condition is also called runner's knee or chondromalacia patella. Patellofemoral pain syndrome is most common in young adults who are active in sports. Your knee is the largest joint in your body. The patella covers the front of your knee and is attached to muscles above and below your knee. The underside of the patella is covered with a smooth type of cartilage (synovium). The smooth surface helps the patella glide easily when you move your knee. Patellofemoral pain syndrome causes swelling in the joint linings and bone surfaces in your knee. What are the causes? Patellofemoral pain syndrome can be caused by:  Overuse.  Poor alignment of your knee joints.  Weak leg muscles.  A direct blow to your kneecap.  What increases the risk? You may be at risk for patellofemoral pain syndrome if you:  Do a lot of activities that can wear down your kneecap. These include: ? Running. ? Squatting. ? Climbing stairs.  Start a new physical activity or exercise program.  Wear shoes that do not fit well.  Do not have good leg strength.  Are overweight.  What are the signs or symptoms? Knee pain is the most common symptom of patellofemoral pain syndrome. This may feel like a dull, aching pain underneath your patella, in the front of your knee. There may be a popping or cracking sound when you move your knee. Pain may get worse with:  Exercise.  Climbing stairs.  Running.  Jumping.  Squatting.  Kneeling.  Sitting for a long time.  Moving or pushing on your patella.  How is this diagnosed? Your health care provider may be able to diagnose patellofemoral pain syndrome from your symptoms and medical history. You may be asked about your recent physical activities  and which ones cause knee pain. Your health care provider may do a physical exam with certain tests to confirm the diagnosis. These may include:  Moving your patella back and forth.  Checking your range of knee motion.  Having you squat or jump to see if you have pain.  Checking the strength of your leg muscles.  An MRI of the knee may also be done. How is this treated? Patellofemoral pain syndrome can usually be treated at home with rest, ice, compression, and elevation (RICE). Other treatments may include:  Nonsteroidal anti-inflammatory drugs (NSAIDs).  Physical therapy to stretch and strengthen your leg muscles.  Shoe inserts (orthotics) to take stress off your knee.  A knee brace or knee support.  Surgery to remove damaged cartilage or move the patella to a better position. The need for surgery is rare.  Follow these instructions at home:  Take medicines only as directed by your health care provider.  Rest your knee. ? When resting, keep your knee raised above the level of your heart. ? Avoid activities that cause knee pain.  Apply ice to the injured area: ? Put ice in a plastic bag. ? Place a towel between your skin and the bag. ? Leave the ice on for 20 minutes, 2-3 times a day.  Use splints, braces, knee supports, or walking aids as directed by your health care provider.  Perform stretching and strengthening exercises as directed by your health care provider or physical therapist.  Keep all follow-up visits as directed by your health care   provider. This is important. Contact a health care provider if:  Your symptoms get worse.  You are not improving with home care. This information is not intended to replace advice given to you by your health care provider. Make sure you discuss any questions you have with your health care provider. Document Released: 04/02/2009 Document Revised: 09/20/2015 Document Reviewed: 07/04/2013 Elsevier Interactive Patient Education   2018 Elsevier Inc.  

## 2017-10-26 NOTE — Progress Notes (Signed)
Impression and Recommendations:    1. Acute pain of right knee   2. Knee swelling    1. -Patient declined PT referral.  Discussed the risks benefits of this with patient and importance of strengthening muscles around the knee area regardless of pathology. 2. -NSAIDs, ice, rest, avoid aggravating activities. 3. -Per patient if becomes unstable or giving out on her, she may need additional evaluation by [place] and/or a specialist. 4. -She will let us know if she changes her mind on the physical therapy 5.   Education and routine counseling performed. Handouts provided.  The patient was counselled, risk factors were discussed, anticipatory guidance given.   Return if symptoms worsen or fail to improve in 3-4 weeks, for Follow-up with PCP-[facility]- in the near future.  Please see AVS handed out to patient at the end of our visit for further patient instructions/ counseling done pertaining to today's office visit.  Note: This document was prepared using Dragon voice recognition software and may include unintentional dictation errors.    Subjective:  Heidi Foster is a 62 y.o. female who sustained a no knee injury but has pain for approximately 1 week now. Mechanism of injury: none-no increased activity or new finding stairs etc.  Patient has no idea how she could have hurt it.  However she does have fibromyalgia and often has aches and pains in her joints and muscles.    Symptoms: Of having pain at night, difficult to bend, and swelling, no redness but does have increased warmth.    Symptoms have been intermittent since that time. Prior history of related problems: no prior problems with this area in the past.   Patient Care Team    Relationship Specialty Notifications Start End  Esaw Grandchild, NP PCP - General Family Medicine  05/26/16      Past Medical History:  Diagnosis Date  . Allergy   . Anginal pain ([place])   . Arthritis   . Cancer ([place])    skin on nose  . Closed fracture  of right patella   . Emphysema of lung ([place])   . GERD (gastroesophageal reflux disease)   . Heart attack ([place]) 1999  . Hypertension   . Osteopenia 12/2016   T score -1.2 FRAX 7.4%/0.8%  . Seborrheic keratoses   . Stroke ([place])    TIA x 2, early 2000's   . Vocal cord polyp     Past Surgical History:  Procedure Laterality Date  . BREAST BIOPSY    . BREAST SURGERY     Left Breast nodule markers  . CARDIAC CATHETERIZATION     1999  . caridac cath    . LOOP RECORDER INSERTION N/A 03/17/2017   Procedure: LOOP RECORDER INSERTION;  Surgeon: Evans Lance, MD;  Location: [facility];  Service: Cardiovascular;  Laterality: N/A;  . lt. ovary removed    . MICROLARYNGOSCOPY WITH LASER Bilateral 11/13/2016   Procedure: MICROLARYNGOSCOPY;  Surgeon: Melissa Montane, MD;  Location: [facility] OR;  Service: ENT;  Laterality: Bilateral;  . polyp removal  10/2016   throat -   . SKIN BIOPSY     nose  . TEE WITHOUT CARDIOVERSION N/A 03/17/2017   Procedure: TRANSESOPHAGEAL ECHOCARDIOGRAM (TEE);  Surgeon: Dorothy Spark, MD;  Location[facility];  Service: Cardiovascular;  Laterality: N/A;  . TUBAL LIGATION      @HXFAM @  Social History   Substance and Sexual Activity  Drug Use No  ,  Social History   Substance and Sexual Activity  Alcohol Use No  ,  Social History   Tobacco Use  Smoking Status Current Every Day Smoker  . Packs/day: 0.50  . Years: 40.00  . Pack years: 20.00  . Types: Cigarettes  . Start date: 09/27/1976  Smokeless Tobacco Never Used  Tobacco Comment   Peak rate of 1ppd  ,  Social History   Substance and Sexual Activity  Sexual Activity Never    Patient's Medications  New Prescriptions   No medications on file  Previous Medications   ALBUTEROL (PROVENTIL HFA;VENTOLIN HFA) 108 (90 BASE) MCG/ACT INHALER    Inhale 2 puffs into the lungs every 4 (four) hours as needed for wheezing or shortness of breath.   AMLODIPINE (NORVASC) 2.5 MG TABLET    Take 1 tablet  (2.5 mg total) by mouth daily.   ATORVASTATIN (LIPITOR) 40 MG TABLET    Take 1 tablet (40 mg total) by mouth daily at 6 PM.   CLOPIDOGREL (PLAVIX) 75 MG TABLET    Take 1 tablet (75 mg total) by mouth daily.   DULOXETINE (CYMBALTA) 30 MG CAPSULE    Take 1 capsule (30 mg total) by mouth 2 (two) times daily.   GABAPENTIN (NEURONTIN) 300 MG CAPSULE    1 cap at bedtime   LOSARTAN (COZAAR) 100 MG TABLET    Take 1 tablet (100 mg total) by mouth daily.   MULTIPLE VITAMIN (MULTIVITAMIN WITH MINERALS) TABS TABLET    Take 1 tablet daily by mouth.   SPACER/AERO-HOLDING CHAMBERS (AEROCHAMBER MV) INHALER    Use as instructed   TRAZODONE (DESYREL) 50 MG TABLET    Take 1 tablet (50 mg total) by mouth at bedtime as needed for sleep.   UMECLIDINIUM-VILANTEROL (ANORO ELLIPTA) 62.5-25 MCG/INH AEPB    Inhale 1 puff daily into the lungs.   UNABLE TO FIND    Med Name: Topricin Pain Relief Cream, Topricin Fibro Cream, Topricin Foot Therapy Cream   VERAPAMIL (VERELAN PM) 180 MG 24 HR CAPSULE    Take 1 capsule (180 mg total) by mouth daily.  Modified Medications   No medications on file  Discontinued Medications   CIPROFLOXACIN (CILOXAN) 0.3 % OPHTHALMIC SOLUTION    Administer 1-2 drops  to affected eye every 2 hours while awake   GENTAMICIN (GARAMYCIN) 0.3 % OPHTHALMIC OINTMENT    Place into the left eye 3 (three) times daily.    Codeine and Bupropion  Scheduled Meds: Continuous Infusions: PRN Meds:.  Review of Systems: General:   Denies fever, chills, unexplained weight loss.  Optho/Auditory:   Denies visual changes, blurred vision/LOV Respiratory:   Denies SOB, DOE more than baseline levels.  Cardiovascular:   Denies chest pain, palpitations, new onset peripheral edema  Gastrointestinal:   Denies nausea, vomiting, diarrhea.  Genitourinary: Denies dysuria, freq/ urgency, flank pain or discharge from genitals.  Endocrine:     Denies hot or cold intolerance, polyuria, polydipsia. Musculoskeletal:   See above  in HPI  Skin:  Denies rash, suspicious lesions Neurological:     Denies dizziness, unexplained weakness, numbness  Psychiatric/Behavioral:   Denies mood changes, suicidal or homicidal ideations, hallucinations   Objective:   Blood pressure 131/74, pulse 75, height 5' 0.75" (1.543 m), weight 154 lb 4.8 oz (70 kg), SpO2 97 %. Body mass index is 29.4 kg/m. General: Well Developed, well nourished, and in no acute distress.  Neuro: Alert and oriented x3, extra-ocular muscles intact, sensation grossly intact.  HEENT: Normocephalic, atraumatic, pupils  equal round reactive to light, neck supple Skin: no gross suspicious lesions or rashes  Cardiac: Regular rate and rhythm, no murmurs rubs or gallops.  Respiratory: Essentially clear to auscultation bilaterally. Not using accessory muscles, speaking in full sentences.  Abdominal: Soft, not grossly distended Musculoskeletal: Ambulates w/o diff, FROM * 4 ext.  R Knee:  Scant amount peripatellar swelling -worse in the upper lateral aspect, some tenderness along lateral aspect of joint line; no gross instability to posterior or anterior drawer test; equivocal varus stress test;  No discomfort on valgus stress test.   FROM without crepitus, mild soft tissue swelling posterior knee, no ecchymosis or erythema, normal contralateral knee exam. Neurovascularly intact distally. No pain upon quick palpation of the ipsilateral hip and ankle joints. Vasc: less 2 sec cap RF, warm and pink  Psych: No HI/SI, judgement and insight good, Euthymic mood. Full Affect.

## 2017-10-27 NOTE — Progress Notes (Signed)
Carelink Summary Report / Loop Recorder 

## 2017-11-09 ENCOUNTER — Encounter: Payer: Self-pay | Admitting: Adult Health

## 2017-11-09 ENCOUNTER — Ambulatory Visit (INDEPENDENT_AMBULATORY_CARE_PROVIDER_SITE_OTHER): Payer: Medicare Other | Admitting: Adult Health

## 2017-11-09 ENCOUNTER — Ambulatory Visit: Payer: Medicare Other

## 2017-11-09 VITALS — BP 120/70 | HR 78 | Ht 60.75 in | Wt 157.9 lb

## 2017-11-09 DIAGNOSIS — M25561 Pain in right knee: Secondary | ICD-10-CM | POA: Diagnosis not present

## 2017-11-09 DIAGNOSIS — I639 Cerebral infarction, unspecified: Secondary | ICD-10-CM

## 2017-11-09 DIAGNOSIS — M25469 Effusion, unspecified knee: Secondary | ICD-10-CM | POA: Diagnosis not present

## 2017-11-09 NOTE — Patient Instructions (Addendum)
Knee Pain, Adult Knee pain in adults is common. It can be caused by many things, including:  Arthritis.  A fluid-filled sac (cyst) or growth in your knee.  An infection in your knee.  An injury that will not heal.  Damage, swelling, or irritation of the tissues that support your knee.  Knee pain is usually not a sign of a serious problem. The pain may go away on its own with time and rest. If it does not, a health care provider may order tests to find the cause of the pain. These may include:  Imaging tests, such as an X-ray, MRI, or ultrasound.  Joint aspiration. In this test, fluid is removed from the knee.  Arthroscopy. In this test, a lighted tube is inserted into knee and an image is projected onto a TV screen.  A biopsy. In this test, a sample of tissue is removed from the body and studied under a microscope.  Follow these instructions at home: Pay attention to any changes in your symptoms. Take these actions to relieve your pain. Activity  Rest your knee.  Do not do things that cause pain or make pain worse.  Avoid high-impact activities or exercises, such as running, jumping rope, or doing jumping jacks. General instructions  Take over-the-counter and prescription medicines only as told by your health care provider.  Raise (elevate) your knee above the level of your heart when you are sitting or lying down.  Sleep with a pillow under your knee.  If directed, apply ice to the knee: ? Put ice in a plastic bag. ? Place a towel between your skin and the bag. ? Leave the ice on for 20 minutes, 2-3 times a day.  Ask your health care provider if you should wear an elastic knee support.  Lose weight if you are overweight. Extra weight can put pressure on your knee.  Do not use any products that contain nicotine or tobacco, such as cigarettes and e-cigarettes. Smoking may slow the healing of any bone and joint problems that you may have. If you need help quitting, ask  your health care provider. Contact a health care provider if:  Your knee pain continues, changes, or gets worse.  You have a fever along with knee pain.  Your knee buckles or locks up.  Your knee swells, and the swelling becomes worse. Get help right away if:  Your knee feels warm to the touch.  You cannot move your knee.  You have severe pain in your knee.  You have chest pain.  You have trouble breathing. Summary  Knee pain in adults is common. It can be caused by many things, including, arthritis, infection, cysts, or injury.  Knee pain is usually not a sign of a serious problem, but if it does not go away, a health care provider may perform tests to know the cause of the pain.  Pay attention to any changes in your symptoms. Relieve your pain with rest, medicines, light activity, and use of ice.  Get help if your pain continues or becomes very severe, or if your knee buckles or locks up, or if you have chest pain or trouble breathing. This information is not intended to replace advice given to you by your health care provider. Make sure you discuss any questions you have with your health care provider. Document Released: 02/09/2007 Document Revised: 04/04/2016 Document Reviewed: 04/04/2016 Elsevier Interactive Patient Education  2018 [employer].  Continue to rest, elevate, and ice right knee.  Continue use Copper sleeve and cane as needed. Allergy to codeine and you are on clopidogrel (Plavix)- OTC Acetaminophen per manufacturer's instructions is the safest pain control option for you. Xray completed today, referral to Orthopedics placed. Please call clinic with any questions/concerns. FEEL BETTER!

## 2017-11-09 NOTE — Assessment & Plan Note (Addendum)
Xray-IMPRESSION: No acute osseous abnormalities. Continue to rest, elevate, and ice right knee. Continue use Copper sleeve and cane as needed. Allergy to codeine and you are on clopidogrel (Plavix)- OTC Acetaminophen per manufacturer's instructions is the safest pain control option for you. Xray completed today, referral to Orthopedics placed. Please call clinic with any questions/concerns.

## 2017-11-09 NOTE — Progress Notes (Signed)
Subjective:    Patient ID: Heidi Foster, female    DOB: March 14, 1956, 62 y.o.   MRN: [id number]  HPI:  Heidi Foster presents with continued R knee pain-started 3 weeks ago. She denies acute injury/trauma prior to onset of sx's.   She denies acute increase in activity prior to onset of sx's. She reports pain will worsen when standing/walking/bending, and especially at night when she is trying to sleep. She denies redness, but reports her R knee "feels warmer than my L knee" She was seen by Dr. Raliegh Scarlet 10/26/17, she declined PT referral. She has been using rest, ice, elevation and Acetaminophen- with only minimal sx relief. She feels that her fibromyalgia pain has reduced since starting gabapentin 300mg  [facility]. Of Note- 2011-  She slipped on chicken noodle soup in nursing home and suffered  R patella fx, that was treated with immobilizer .  Patient Care Team    Relationship Specialty Notifications Start End  Esaw Grandchild, NP PCP - General Family Medicine  05/26/16     Patient Active Problem List   Diagnosis Date Noted  . Acute pain of right knee 11/09/2017  . Knee swelling 11/09/2017  . Restless leg syndrome 07/28/2017  . Low serum creatine 06/30/2017  . Acute bacterial conjunctivitis of left eye 06/30/2017  . Insomnia 04/01/2017  . Cerebral aneurysm   . Stroke ([place]) 03/15/2017  . Paresthesia of both hands 03/09/2017  . Hx of transient ischemic attack (TIA) 03/09/2017  . Fibromyalgia 01/22/2017  . Breast pain, left 12/15/2016  . Generalized weakness 12/15/2016  . Leg pain, bilateral 12/15/2016  . 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 Hoarseness 10/08/2016  . Laryngopharyngeal reflux (LPR) 10/08/2016  . Centrilobular emphysema ([place]) 09/09/2016  . GERD (gastroesophageal reflux disease) 06/30/2016  . Dysphagia 06/30/2016  . Health care maintenance 06/25/2016  . Other fatigue 05/26/2016  . Hypertension 05/26/2016  . Other hyperlipidemia 05/26/2016  . Family history of diabetes mellitus in  mother 05/26/2016  . Acute pain of right shoulder 05/26/2016  . Tobacco use disorder 05/26/2016  . Dyspnea on exertion 05/26/2016  . Chronic pain of right knee 05/26/2016  . History of MI (myocardial infarction) 05/26/2016  . History of TIA (transient ischemic attack) 05/26/2016  . Numbness and tingling of left lower extremity 05/26/2016  . Numbness and tingling in left hand 05/26/2016  . 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 Allergy   . Anginal pain ([place])   . Arthritis   . 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 Closed fracture of right patella   . Emphysema of lung ([place])   . GERD (gastroesophageal reflux disease)   . Heart attack ([place]) 1999  . Hypertension   . Osteopenia 12/2016   T score -1.2 FRAX 7.4%/0.8%  . Seborrheic keratoses   . Stroke ([place])    TIA x 2, early 2000's   . Vocal cord polyp      Past Surgical History:  Procedure Laterality Date  . BREAST BIOPSY    . BREAST SURGERY     Left Breast nodule markers  . CARDIAC CATHETERIZATION     1999  . caridac cath    . LOOP RECORDER INSERTION N/A 03/17/2017   Procedure: LOOP RECORDER INSERTION;  Surgeon: Evans Lance, MD;  Location: [place] [facility];  Service: Cardiovascular;  Laterality: N/A;  . lt. ovary removed    . MICROLARYNGOSCOPY WITH LASER Bilateral 11/13/2016   Procedure:  MICROLARYNGOSCOPY;  Surgeon: Melissa Montane, MD;  Location: [facility] OR;  Service: ENT;  Laterality: Bilateral;  . polyp removal  10/2016   throat -   . SKIN BIOPSY     nose  . TEE WITHOUT CARDIOVERSION N/A 03/17/2017   Procedure: TRANSESOPHAGEAL ECHOCARDIOGRAM (TEE);  Surgeon: Dorothy Spark, MD;  [facility] ENDOSCOPY;  Service: Cardiovascular;  Laterality: N/A;  . TUBAL LIGATION       Family History  Problem Relation Age of Onset  . Diabetes Mother   . Hypertension Mother   . Stroke Mother   . Alcohol abuse Father   . Multiple sclerosis Sister   . Cancer Brother        lung  . Stroke Maternal  Grandmother   . Heart attack Maternal Grandmother   . Hypertension Maternal Grandmother   . Colon cancer Neg Hx   . Colon polyps Neg Hx   . Esophageal cancer Neg Hx   . Stomach cancer Neg Hx   . Rectal cancer Neg Hx      Social History   Substance and Sexual Activity  Drug Use No     Social History   Substance and Sexual Activity  Alcohol Use No     Social History   Tobacco Use  Smoking Status Current Every Day Smoker  . Packs/day: 0.50  . Years: 40.00  . Pack years: 20.00  . Types: Cigarettes  . Start date: 09/27/1976  Smokeless Tobacco Never Used  Tobacco Comment   Peak rate of 1ppd     Outpatient Encounter Medications as of 11/09/2017  Medication Sig  . albuterol (PROVENTIL HFA;VENTOLIN HFA) 108 (90 Base) MCG/ACT inhaler Inhale 2 puffs into the lungs every 4 (four) hours as needed for wheezing or shortness of breath.  Marland Kitchen amLODipine (NORVASC) 2.5 MG tablet Take 1 tablet (2.5 mg total) by mouth daily.  Marland Kitchen atorvastatin (LIPITOR) 40 MG tablet Take 1 tablet (40 mg total) by mouth daily at 6 PM.  . clopidogrel (PLAVIX) 75 MG tablet Take 1 tablet (75 mg total) by mouth daily.  . DULoxetine (CYMBALTA) 30 MG capsule Take 1 capsule (30 mg total) by mouth 2 (two) times daily.  Marland Kitchen gabapentin (NEURONTIN) 300 MG capsule 1 cap at bedtime  . losartan (COZAAR) 100 MG tablet Take 1 tablet (100 mg total) by mouth daily.  . Multiple Vitamin (MULTIVITAMIN WITH MINERALS) TABS tablet Take 1 tablet daily by mouth.  . Spacer/Aero-Holding Chambers (AEROCHAMBER MV) inhaler Use as instructed  . traZODone (DESYREL) 50 MG tablet Take 1 tablet (50 mg total) by mouth at bedtime as needed for sleep.  Marland Kitchen umeclidinium-vilanterol (ANORO ELLIPTA) 62.5-25 MCG/INH AEPB Inhale 1 puff daily into the lungs.  Marland Kitchen UNABLE TO FIND Med Name: Topricin Pain Relief Cream, Topricin Fibro Cream, Topricin Foot Therapy Cream  . verapamil (VERELAN PM) 180 MG 24 hr capsule Take 1 capsule (180 mg total) by mouth daily.    No facility-administered encounter medications on file as of 11/09/2017.     Allergies: Codeine and Bupropion  Body mass index is 30.08 kg/m.  Blood pressure 120/70, pulse 78, height 5' 0.75" (1.543 m), weight 157 lb 14.4 oz (71.6 kg), SpO2 97 %.  Review of Systems  Constitutional: Positive for activity change and fatigue. Negative for appetite change, chills, diaphoresis, fever and unexpected weight change.  Respiratory: Negative for cough, chest tightness, shortness of breath, wheezing and stridor.   Cardiovascular: Negative for chest pain, palpitations and leg swelling.  Musculoskeletal: Positive for arthralgias, back  pain, gait problem, joint swelling, myalgias, neck pain and neck stiffness.  Neurological: Negative for dizziness and headaches.  Hematological: Does not bruise/bleed easily.  Psychiatric/Behavioral: Positive for sleep disturbance. Negative for dysphoric mood, hallucinations, self-injury and suicidal ideas. The patient is not nervous/anxious and is not hyperactive.        Objective:   Physical Exam  Constitutional: She appears well-developed and well-nourished. No distress.  Musculoskeletal: She exhibits edema and tenderness.       Right hip: Normal.       Right knee: She exhibits decreased range of motion and swelling. She exhibits normal alignment, no LCL laxity and normal patellar mobility. Tenderness found. Lateral joint line and patellar tendon tenderness noted.       Left knee: Normal.       Right ankle: Normal.      Assessment & Plan:   1. Acute pain of right knee   2. Knee swelling     Acute pain of right knee Xray- Continue to rest, elevate, and ice right knee. Continue use Copper sleeve and cane as needed. Allergy to codeine and you are on clopidogrel (Plavix)- OTC Acetaminophen per manufacturer's instructions is the safest pain control option for you. Xray completed today, referral to Orthopedics placed. Please call clinic with any  questions/concerns.  FOLLOW-UP:  Return if symptoms worsen or fail to improve.

## 2017-11-13 ENCOUNTER — Ambulatory Visit (INDEPENDENT_AMBULATORY_CARE_PROVIDER_SITE_OTHER): Payer: Medicare Other | Admitting: Orthopaedic Surgery

## 2017-11-13 ENCOUNTER — Encounter (INDEPENDENT_AMBULATORY_CARE_PROVIDER_SITE_OTHER): Payer: Self-pay | Admitting: Orthopaedic Surgery

## 2017-11-13 DIAGNOSIS — M25561 Pain in right knee: Secondary | ICD-10-CM

## 2017-11-13 MED ORDER — METHYLPREDNISOLONE ACETATE 40 MG/ML IJ SUSP
40.0000 mg | INTRAMUSCULAR | Status: AC | PRN
  Administered 2017-11-13: 40 mg via INTRA_ARTICULAR

## 2017-11-13 MED ORDER — LIDOCAINE HCL 1 % IJ SOLN
2.0000 mL | INTRAMUSCULAR | Status: AC | PRN
  Administered 2017-11-13: 2 mL

## 2017-11-13 MED ORDER — BUPIVACAINE HCL 0.25 % IJ SOLN
2.0000 mL | INTRAMUSCULAR | Status: AC | PRN
  Administered 2017-11-13: 2 mL via INTRA_ARTICULAR

## 2017-11-13 NOTE — Progress Notes (Signed)
Office Visit Note   Patient: Heidi Foster           Date of Birth: November 25, 1955           MRN: [id number] Visit Date: 11/13/2017              Requested by: Esaw Grandchild, NP [place], [place] [address] PCP: Esaw Grandchild, NP   Assessment & Plan: Visit Diagnoses:  1. Acute pain of right knee     Plan: Impression is right knee degenerative lateral meniscus tear.  Today, we injected the right knee joint with cortisone.  She will take it easy over the weekend.  She will ice and elevate as needed.  If she is not any better in the next several weeks she will call and let us know and we will order an MRI to further assess her right knee joint.  Call with concerns or questions in the meantime.  Follow-Up Instructions: Return if symptoms worsen or fail to improve.   Orders:  Orders Placed This Encounter  Procedures  . Large Joint Inj: R knee   No orders of the defined types were placed in this encounter.     Procedures: Large Joint Inj: R knee on 11/13/2017 10:18 AM Indications: pain Details: 22 G needle, anterolateral approach Medications: 2 mL lidocaine 1 %; 2 mL bupivacaine 0.25 %; 40 mg methylPREDNISolone acetate 40 MG/ML      Clinical Data: No additional findings.   Subjective: Chief Complaint  Patient presents with  . Right Knee - Pain    HPI patient is a pleasant 62 year old female who presents to our clinic today with right knee pain.  This began approximately 3 to 4 weeks ago without any known injury or change in activity.  The pain she has is posterior lateral.  She does note moderate stiffness.  Pain is worse with extension of the knee as well as with  going up and down stairs.  She has tried Tylenol with mild relief of symptoms.  Of note, she has previously had a stroke affecting the left side.  She has been favoring the right side since.  She was seen by her primary care provider approximately 3 days ago where x-rays were obtained.  These showed mild  periarticular spurring.  Review of Systems as detailed in HPI.  All others reviewed and are negative.   Objective: Vital Signs: There were no vitals taken for this visit.  Physical Exam well-developed well-nourished female no acute distress.  Alert and oriented x3.  Ortho Exam examination of the right knee shows a trace effusion.  Range of motion 0 to 95 degrees.  Mild patellofemoral crepitus.  Marked tenderness lateral joint line.  Calf is soft and nontender.  She is neurovascularly intact distally.  Specialty Comments:  No specialty comments available.  Imaging: No new imaging   PMFS History: Patient Active Problem List   Diagnosis Date Noted  . Acute pain of right knee 11/09/2017  . Knee swelling 11/09/2017  . Restless leg syndrome 07/28/2017  . Low serum creatine 06/30/2017  . Acute bacterial conjunctivitis of left eye 06/30/2017  . Insomnia 04/01/2017  . Cerebral aneurysm   . Stroke ([place]) 03/15/2017  . Paresthesia of both hands 03/09/2017  . Hx of transient ischemic attack (TIA) 03/09/2017  . Fibromyalgia 01/22/2017  . Breast pain, left 12/15/2016  . Generalized weakness 12/15/2016  . Leg pain, bilateral 12/15/2016  . 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 Hoarseness 10/08/2016  .  Laryngopharyngeal reflux (LPR) 10/08/2016  . Centrilobular emphysema ([place]) 09/09/2016  . GERD (gastroesophageal reflux disease) 06/30/2016  . Dysphagia 06/30/2016  . Health care maintenance 06/25/2016  . Other fatigue 05/26/2016  . Hypertension 05/26/2016  . Other hyperlipidemia 05/26/2016  . Family history of diabetes mellitus in mother 05/26/2016  . Acute pain of right shoulder 05/26/2016  . Tobacco use disorder 05/26/2016  . Dyspnea on exertion 05/26/2016  . Chronic pain of right knee 05/26/2016  . History of MI (myocardial infarction) 05/26/2016  . History of TIA (transient ischemic attack) 05/26/2016  . Numbness and tingling of left lower extremity 05/26/2016  . Numbness and tingling  in left hand 05/26/2016  . 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 Allergy   . Anginal pain ([place])   . Arthritis   . 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 Closed fracture of right patella   . Emphysema of lung ([place])   . GERD (gastroesophageal reflux disease)   . Heart attack ([place]) 1999  . Hypertension   . Osteopenia 12/2016   T score -1.2 FRAX 7.4%/0.8%  . Seborrheic keratoses   . Stroke ([place])    TIA x 2, early 2000's   . Vocal cord polyp     Family History  Problem Relation Age of Onset  . Diabetes Mother   . Hypertension Mother   . Stroke Mother   . Alcohol abuse Father   . Multiple sclerosis Sister   . Cancer Brother        lung  . Stroke Maternal Grandmother   . Heart attack Maternal Grandmother   . Hypertension Maternal Grandmother   . Colon cancer Neg Hx   . Colon polyps Neg Hx   . Esophageal cancer Neg Hx   . Stomach cancer Neg Hx   . Rectal cancer Neg Hx     Past Surgical History:  Procedure Laterality Date  . BREAST BIOPSY    . BREAST SURGERY     Left Breast nodule markers  . CARDIAC CATHETERIZATION     1999  . caridac cath    . LOOP RECORDER INSERTION N/A 03/17/2017   Procedure: LOOP RECORDER INSERTION;  Surgeon: Evans Lance, MD;  Location: [place] [facility] LAB;  Service: Cardiovascular;  Laterality: N/A;  . lt. ovary removed    . MICROLARYNGOSCOPY WITH LASER Bilateral 11/13/2016   Procedure: MICROLARYNGOSCOPY;  Surgeon: Melissa Montane, MD;  Location[facility] OR;  Service: ENT;  Laterality: Bilateral;  . polyp removal  10/2016   throat -   . SKIN BIOPSY     nose  . TEE WITHOUT CARDIOVERSION N/A 03/17/2017   Procedure: TRANSESOPHAGEAL ECHOCARDIOGRAM (TEE);  Surgeon: Dorothy Spark, MD;  Location: [facility] ENDOSCOPY;  Service: Cardiovascular;  Laterality: N/A;  . TUBAL LIGATION     Social History   Occupational History  . Not on file  Tobacco Use  . Smoking status: Current Every Day Smoker    Packs/day: 0.50    Years: 40.00     Pack years: 20.00    Types: Cigarettes    Start date: 09/27/1976  . Smokeless tobacco: Never Used  . Tobacco comment: Peak rate of 1ppd  Substance and Sexual Activity  . Alcohol use: No  . Drug use: No  . Sexual activity: Never

## 2017-11-24 LAB — CUP PACEART REMOTE DEVICE CHECK
Date Time Interrogation Session: 20190701160542
MDC IDC PG IMPLANT DT: 20181120

## 2017-11-30 ENCOUNTER — Ambulatory Visit (INDEPENDENT_AMBULATORY_CARE_PROVIDER_SITE_OTHER): Payer: Medicare Other | Admitting: *Deleted

## 2017-11-30 DIAGNOSIS — I639 Cerebral infarction, unspecified: Secondary | ICD-10-CM | POA: Diagnosis not present

## 2017-12-01 NOTE — Progress Notes (Signed)
Carelink Summary Report / Loop Recorder 

## 2017-12-24 ENCOUNTER — Telehealth: Payer: Self-pay | Admitting: Adult Health

## 2017-12-24 DIAGNOSIS — M25469 Effusion, unspecified knee: Secondary | ICD-10-CM

## 2017-12-24 DIAGNOSIS — G8929 Other chronic pain: Secondary | ICD-10-CM

## 2017-12-24 DIAGNOSIS — M25561 Pain in right knee: Principal | ICD-10-CM

## 2017-12-24 NOTE — Telephone Encounter (Signed)
Patient called states has bad experience w/ [facility] and wishes new referral to [facility].  ---Forwarding request to medical assistant.  --[name]

## 2017-12-24 NOTE — Telephone Encounter (Signed)
Referral placed.  Pt informed.  T. Tiffony Kite, CMA 

## 2017-12-29 DIAGNOSIS — M25561 Pain in right knee: Secondary | ICD-10-CM | POA: Diagnosis not present

## 2017-12-31 ENCOUNTER — Telehealth: Payer: Self-pay | Admitting: Adult Health

## 2017-12-31 ENCOUNTER — Ambulatory Visit (INDEPENDENT_AMBULATORY_CARE_PROVIDER_SITE_OTHER): Payer: Medicare Other | Admitting: *Deleted

## 2017-12-31 DIAGNOSIS — I639 Cerebral infarction, unspecified: Secondary | ICD-10-CM | POA: Diagnosis not present

## 2017-12-31 DIAGNOSIS — I10 Essential (primary) hypertension: Secondary | ICD-10-CM

## 2017-12-31 MED ORDER — ATORVASTATIN CALCIUM 40 MG PO TABS: 40.0000 mg | ORAL_TABLET | Freq: Every day | ORAL | 0 refills | Status: DC

## 2017-12-31 MED ORDER — LOSARTAN POTASSIUM 100 MG PO TABS: 100.0000 mg | ORAL_TABLET | Freq: Every day | ORAL | 0 refills | Status: DC

## 2017-12-31 MED ORDER — AMLODIPINE BESYLATE 2.5 MG PO TABS: 2.5000 mg | ORAL_TABLET | Freq: Every day | ORAL | 0 refills | Status: DC

## 2017-12-31 MED ORDER — DULOXETINE HCL 30 MG PO CPEP: 30.0000 mg | ORAL_CAPSULE | Freq: Two times a day (BID) | ORAL | 0 refills | Status: DC

## 2017-12-31 NOTE — Telephone Encounter (Signed)
Patient is requestign a refill of her amlodipine, gabapentin, atorvastatin, duloxetine, and losartan. If approved please send to [facility] on [address]. Patient would like a phone to confirm when this is done please

## 2017-12-31 NOTE — Progress Notes (Signed)
Carelink Summary Report / Loop Recorder 

## 2017-12-31 NOTE — Addendum Note (Signed)
Addended by: Fonnie Mu on: 12/31/2017 03:54 PM   Modules accepted: Orders

## 2018-01-01 ENCOUNTER — Telehealth: Payer: Self-pay

## 2018-01-01 DIAGNOSIS — Z789 Other specified health status: Secondary | ICD-10-CM

## 2018-01-01 DIAGNOSIS — Z8673 Personal history of transient ischemic attack (TIA), and cerebral infarction without residual deficits: Secondary | ICD-10-CM
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
## 2018-01-04 ENCOUNTER — Other Ambulatory Visit: Payer: Self-pay

## 2018-01-04 ENCOUNTER — Telehealth: Payer: Self-pay | Admitting: Adult Health

## 2018-01-04 MED ORDER — VERAPAMIL HCL ER 180 MG PO CP24: 180.0000 mg | ORAL_CAPSULE | Freq: Every day | ORAL | 0 refills | Status: DC

## 2018-01-04 NOTE — Telephone Encounter (Signed)
Advised pt that she should check with neurologist, Dr. Doyle Askew, to see if she needs to continue this medication.   Charyl Bigger, CMA

## 2018-01-04 NOTE — Telephone Encounter (Signed)
Patient called states daughter picked up Rx for: clopidogrel (PLAVIX) 75 MG tablet [[id number]]   Order Details  Dose: 75 mg Route: Oral Frequency: Daily  Dispense Quantity: 30 tablet Refills: 0 Fills remaining: --        Sig: Take 1 tablet (75 mg total) by mouth daily.           &  Patient doesn't know if she should still be taking (it was prescribed to her by provider when she had the stroke back in Nov 2018--advised would send inquiry to medical provider & assistant for their review (see hospital doctor Original prescriber)  --forwarding message to medical assistant for review & patient call back w/instruction.  --glh

## 2018-01-05 ENCOUNTER — Telehealth: Payer: Self-pay | Admitting: Adult Health

## 2018-01-05 MED ORDER — VERAPAMIL HCL ER 180 MG PO TBCR: 180.0000 mg | EXTENDED_RELEASE_TABLET | Freq: Every day | ORAL | 1 refills | Status: DC

## 2018-01-05 NOTE — Telephone Encounter (Signed)
Is it okay to send in RX for tablets instead of capsules?  Charyl Bigger, CMA

## 2018-01-05 NOTE — Telephone Encounter (Signed)
Patient called states can't afford Rx (verapamil (VERELAN PM) 180 MG 24 hr capsule [[id number]]   Order Details  Dose: 180 mg Route: Oral Frequency: Daily  Dispense Quantity: 90 capsule Refills: 0 Fills remaining: --        Sig: Take 1 capsule (180 mg total) by mouth daily.          ----  It's too expensive in capsule form (Pt did not get yesterday 01/04/18)-- [facility] pharmacist states cheaper to prescribe in Tablet form.  --Forwarding request to switch Rx to tablets instead of capsules at pharmacy: [facility] [address] ([address] [place] [phone] (Phone) [phone] (Fax)   --glh

## 2018-01-05 NOTE — Telephone Encounter (Signed)
Pt informed RX for tablets sent to pharmacy.  Charyl Bigger, CMA

## 2018-01-05 NOTE — Telephone Encounter (Signed)
Yes ma'am. Thanks! Heidi Foster

## 2018-01-11 LAB — CUP PACEART REMOTE DEVICE CHECK
MDC IDC PG IMPLANT DT: 20181120
MDC IDC SESS DTM: 20190803164116

## 2018-01-22 LAB — CUP PACEART REMOTE DEVICE CHECK
Date Time Interrogation Session: 20190905163640
MDC IDC PG IMPLANT DT: 20181120

## 2018-01-25 ENCOUNTER — Telehealth: Payer: Self-pay

## 2018-01-25 NOTE — Telephone Encounter (Signed)
Spoke with Delfino Lovett of [employer] [place] regarding pt's currently living, transportation, and financial situations.  The following was provided to the pt in writing:  [place]:  [place] (see attached applications and information):  transportation within city limits Cost $1.50 one way, $3 round trip Armed forces technical officer:  Free transportation to medical office visits, one trip weekly, Financial planner for application.  They will not take you to the pharmacy, but will sometimes swing by a pharmacy for prescription pickup after a doctor's visit  Food Stamps, Application for Medicaid:   Contact your DHHS case worker for help filling out these forms and to possibly increase the amount of the food stamps that you receive monthly.  If you qualify for Medicaid (which you should since your income is less than $1000 per month), then this insurance will aid in the cost of physician, hospital and prescriptions cost.  Also, if you qualify, all premiums for Medicare that is withheld from your disability check will no longer be withheld.  Housing:  Unfortunately, there is a wait list for senior housing.  However, you should go ahead and fill out an application and get put on the waiting list.  Senior Resources representative suggested [facility] [phone]) due to its convenience of grocery stores, pharmacies, etc.  You may also qualify for a patient assistance program for your prescriptions.  I would suggest that you contact Senior Resources and schedule an appointment for one of their social workers to come to your home and assist you with all of the applications and services that you may qualify for.  They will be a great source of assistance in helping you remedy your current financial and housing needs.  Please let me know if there is anything else that you need from our office to assist you in obtaining all services that you need!  Will provide all  information in writing to pt at her OV on 01/27/18.  Charyl Bigger, CMA

## 2018-01-27 ENCOUNTER — Ambulatory Visit (INDEPENDENT_AMBULATORY_CARE_PROVIDER_SITE_OTHER): Payer: Medicare Other | Admitting: Adult Health

## 2018-01-27 ENCOUNTER — Encounter: Payer: Self-pay | Admitting: Adult Health

## 2018-01-27 DIAGNOSIS — I10 Essential (primary) hypertension: Secondary | ICD-10-CM | POA: Diagnosis not present

## 2018-01-27 DIAGNOSIS — G2581 Restless legs syndrome: Secondary | ICD-10-CM

## 2018-01-27 DIAGNOSIS — I639 Cerebral infarction, unspecified: Secondary | ICD-10-CM | POA: Diagnosis not present

## 2018-01-27 DIAGNOSIS — Z0000 Encounter for general adult medical examination without abnormal findings: Secondary | ICD-10-CM

## 2018-01-27 DIAGNOSIS — Z8673 Personal history of transient ischemic attack (TIA), and cerebral infarction without residual deficits: Secondary | ICD-10-CM | POA: Diagnosis not present

## 2018-01-27 DIAGNOSIS — R490 Dysphonia: Secondary | ICD-10-CM

## 2018-01-27 DIAGNOSIS — M797 Fibromyalgia: Secondary | ICD-10-CM

## 2018-01-27 MED ORDER — CLOPIDOGREL BISULFATE 75 MG PO TABS: 75.0000 mg | ORAL_TABLET | Freq: Every day | ORAL | 3 refills | Status: DC

## 2018-01-27 NOTE — Patient Instructions (Addendum)
Mediterranean Diet A Mediterranean diet refers to food and lifestyle choices that are based on the traditions of countries located on the [place]. This way of eating has been shown to help prevent certain conditions and improve outcomes for people who have chronic diseases, like kidney disease and heart disease. What are tips for following this plan? Lifestyle  Cook and eat meals together with your family, when possible.  Drink enough fluid to keep your urine clear or pale yellow.  Be physically active every day. This includes: ? Aerobic exercise like running or swimming. ? Leisure activities like gardening, walking, or housework.  Get 7-8 hours of sleep each night.  If recommended by your health care provider, drink red wine in moderation. This means 1 glass a day for nonpregnant women and 2 glasses a day for men. A glass of wine equals 5 oz (150 mL). Reading food labels  Check the serving size of packaged foods. For foods such as rice and pasta, the serving size refers to the amount of cooked product, not dry.  Check the total fat in packaged foods. Avoid foods that have saturated fat or trans fats.  Check the ingredients list for added sugars, such as corn syrup. Shopping  At the grocery store, buy most of your food from the areas near the walls of the store. This includes: ? Fresh fruits and vegetables (produce). ? Grains, beans, nuts, and seeds. Some of these may be available in unpackaged forms or large amounts (in bulk). ? Fresh seafood. ? Poultry and eggs. ? Low-fat dairy products.  Buy whole ingredients instead of prepackaged foods.  Buy fresh fruits and vegetables in-season from local farmers markets.  Buy frozen fruits and vegetables in resealable bags.  If you do not have access to quality fresh seafood, buy precooked frozen shrimp or canned fish, such as tuna, salmon, or sardines.  Buy small amounts of raw or cooked vegetables, salads, or olives from the  deli or salad bar at your store.  Stock your pantry so you always have certain foods on hand, such as olive oil, canned tuna, canned tomatoes, rice, pasta, and beans. Cooking  Cook foods with extra-virgin olive oil instead of using butter or other vegetable oils.  Have meat as a side dish, and have vegetables or grains as your main dish. This means having meat in small portions or adding small amounts of meat to foods like pasta or stew.  Use beans or vegetables instead of meat in common dishes like chili or lasagna.  Experiment with different cooking methods. Try roasting or broiling vegetables instead of steaming or sauteing them.  Add frozen vegetables to soups, stews, pasta, or rice.  Add nuts or seeds for added healthy fat at each meal. You can add these to yogurt, salads, or vegetable dishes.  Marinate fish or vegetables using olive oil, lemon juice, garlic, and fresh herbs. Meal planning  Plan to eat 1 vegetarian meal one day each week. Try to work up to 2 vegetarian meals, if possible.  Eat seafood 2 or more times a week.  Have healthy snacks readily available, such as: ? Vegetable sticks with hummus. ? Greek yogurt. ? Fruit and nut trail mix.  Eat balanced meals throughout the week. This includes: ? Fruit: 2-3 servings a day ? Vegetables: 4-5 servings a day ? Low-fat dairy: 2 servings a day ? Fish, poultry, or lean meat: 1 serving a day ? Beans and legumes: 2 or more servings a week ? Nuts   and seeds: 1-2 servings a day ? Whole grains: 6-8 servings a day ? Extra-virgin olive oil: 3-4 servings a day  Limit red meat and sweets to only a few servings a month What are my food choices?  Mediterranean diet ? Recommended ? Grains: Whole-grain pasta. Brown rice. Bulgar wheat. Polenta. Couscous. Whole-wheat bread. [place]. ? Vegetables: Artichokes. Beets. Broccoli. Cabbage. Carrots. Eggplant. Green beans. Chard. Kale. Spinach. Onions. Leeks. Peas. Squash.  Tomatoes. Peppers. Radishes. ? Fruits: Apples. Apricots. Avocado. Berries. Bananas. Cherries. Dates. Figs. Grapes. Lemons. Melon. Oranges. Peaches. Plums. Pomegranate. ? Meats and other protein foods: Beans. Almonds. Sunflower seeds. Pine nuts. Peanuts. [place]. Salmon. Scallops. Shrimp. [place]. Tilapia. Clams. Oysters. Eggs. ? Dairy: Low-fat milk. Cheese. Greek yogurt. ? Beverages: Water. Red wine. Herbal tea. ? Fats and oils: Extra virgin olive oil. Avocado oil. Grape seed oil. ? Sweets and desserts: [place] yogurt with honey. Baked apples. Poached pears. Trail mix. ? Seasoning and other foods: Basil. Cilantro. Coriander. Cumin. Mint. Parsley. Sage. Rosemary. Tarragon. Garlic. Oregano. Thyme. Pepper. Balsalmic vinegar. Tahini. Hummus. Tomato sauce. Olives. Mushrooms. ? Limit these ? Grains: Prepackaged pasta or rice dishes. Prepackaged cereal with added sugar. ? Vegetables: Deep fried potatoes (french fries). ? Fruits: Fruit canned in syrup. ? Meats and other protein foods: Beef. Pork. Lamb. Poultry with skin. Hot dogs. [place] Salines. ? Dairy: Ice cream. Sour cream. Whole milk. ? Beverages: Juice. Sugar-sweetened soft drinks. Beer. Liquor and spirits. ? Fats and oils: Butter. Canola oil. Vegetable oil. Beef fat (tallow). Lard. ? Sweets and desserts: Cookies. Cakes. Pies. Candy. ? Seasoning and other foods: Mayonnaise. Premade sauces and marinades. ? The items listed may not be a complete list. Talk with your dietitian about what dietary choices are right for you. Summary  The Mediterranean diet includes both food and lifestyle choices.  Eat a variety of fresh fruits and vegetables, beans, nuts, seeds, and whole grains.  Limit the amount of red meat and sweets that you eat.  Talk with your health care provider about whether it is safe for you to drink red wine in moderation. This means 1 glass a day for nonpregnant women and 2 glasses a day for men. A glass of wine equals 5 oz (150 mL). This information  is not intended to replace advice given to you by your health care provider. Make sure you discuss any questions you have with your health care provider. Document Released: 12/06/2015 Document Revised: 01/08/2016 Document Reviewed: 12/06/2015 Elsevier Interactive Patient Education  2018 [place].   Myofascial Pain Syndrome and Fibromyalgia Myofascial pain syndrome and fibromyalgia are both pain disorders. This pain may be felt mainly in your muscles.  Myofascial pain syndrome: ? Always has trigger points or tender points in the muscle that will cause pain when pressed. The pain may come and go. ? Usually affects your neck, upper back, and shoulder areas. The pain often radiates into your arms and hands.  Fibromyalgia: ? Has muscle pains and tenderness that come and go. ? Is often associated with fatigue and sleep disturbances. ? Has trigger points. ? Tends to be long-lasting (chronic), but is not life-threatening.  Fibromyalgia and myofascial pain are not the same. However, they often occur together. If you have both conditions, each can make the other worse. Both are common and can cause enough pain and fatigue to make day-to-day activities difficult. What are the causes? The exact causes of fibromyalgia and myofascial pain are not known. People with certain gene types may be more likely to  develop fibromyalgia. Some factors can be triggers for both conditions, such as:  Spine disorders.  Arthritis.  Severe injury (trauma) and other physical stressors.  Being under a lot of stress.  A medical illness.  What are the signs or symptoms? Fibromyalgia The main symptom of fibromyalgia is widespread pain and tenderness in your muscles. This can vary over time. Pain is sometimes described as stabbing, shooting, or burning. You may have tingling or numbness, too. You may also have sleep problems and fatigue. You may wake up feeling tired and groggy (fibro fog). Other symptoms may  include:  Bowel and bladder problems.  Headaches.  Visual problems.  Problems with odors and noises.  Depression or mood changes.  Painful menstrual periods (dysmenorrhea).  Dry skin or eyes.  Myofascial pain syndrome Symptoms of myofascial pain syndrome include:  Tight, ropy bands of muscle.  Uncomfortable sensations in muscular areas, such as: ? Aching. ? Cramping. ? Burning. ? Numbness. ? Tingling. ? Muscle weakness.  Trouble moving certain muscles freely (range of motion).  How is this diagnosed? There are no specific tests to diagnose fibromyalgia or myofascial pain syndrome. Both can be hard to diagnose because their symptoms are common in many other conditions. Your health care provider may suspect one or both of these conditions based on your symptoms and medical history. Your health care provider will also do a physical exam. The key to diagnosing fibromyalgia is having pain, fatigue, and other symptoms for more than three months that cannot be explained by another condition. The key to diagnosing myofascial pain syndrome is finding trigger points in muscles that are tender and cause pain elsewhere in your body (referred pain). How is this treated? Treating fibromyalgia and myofascial pain often requires a team of health care providers. This usually starts with your primary provider and a physical therapist. You may also find it helpful to work with alternative health care providers, such as massage therapists or acupuncturists. Treatment for fibromyalgia may include medicines. This may include nonsteroidal anti-inflammatory drugs (NSAIDs), along with other medicines. Treatment for myofascial pain may also include:  NSAIDs.  Cooling and stretching of muscles.  Trigger point injections.  Sound wave (ultrasound) treatments to stimulate muscles.  Follow these instructions at home:  Take medicines only as directed by your health care provider.  Exercise as  directed by your health care provider or physical therapist.  Try to avoid stressful situations.  Practice relaxation techniques to control your stress. You may want to try: ? Biofeedback. ? Visual imagery. ? Hypnosis. ? Muscle relaxation. ? Yoga. ? Meditation.  Talk to your health care provider about alternative treatments, such as acupuncture or massage treatment.  Maintain a healthy lifestyle. This includes eating a healthy diet and getting enough sleep.  Consider joining a support group.  Do not do activities that stress or strain your muscles. That includes repetitive motions and heavy lifting. Where to find more information:  National Fibromyalgia Association: www.fmaware.[place]: www.arthritis.org  American Chronic Pain Association: OEMDeals.dk Contact a health care provider if:  You have new symptoms.  Your symptoms get worse.  You have side effects from your medicines.  You have trouble sleeping.  Your condition is causing depression or anxiety. This information is not intended to replace advice given to you by your health care provider. Make sure you discuss any questions you have with your health care provider. Document Released: 04/14/2005 Document Revised: 09/20/2015 Document Reviewed: 01/18/2014 Elsevier Interactive Patient Education  [employer].  Continue all medications as directed. Restart Clopidogrel (Plvix) 75mg  once daily- rx sent in and you have bottle at home. Please be seen by your cardiology practice at least once yearly. Continue to drink plenty of water and follow Mediterranean diet. Continue daily walking- great job on the hour a day! If nasal drainage and cough continue into next week, please call clinic and we will send in antibiotic. If you need any assistance with living forms, please call clinic. Please schedule complete physical with fasting labs in 6 months. NICE TO SEE YOU!

## 2018-01-27 NOTE — Assessment & Plan Note (Signed)
Continue all medications as directed. Continue to drink plenty of water and follow Mediterranean diet. Continue daily walking- great job on the hour a day! If nasal drainage and cough continue into next week, please call clinic and we will send in antibiotic. If you need any assistance with living forms, please call clinic. Please schedule complete physical with fasting labs in 6 months.

## 2018-01-27 NOTE — Progress Notes (Signed)
Subjective:    Patient ID: Heidi Foster, female    DOB: 1955/08/07, 62 y.o.   MRN: [id number]  HPI:  Heidi Foster is here for 6 month f/u for:HTN, HLD, RLS, Fibromyalgia She reports medication compliance, denies SE She reports improved sleep with addition of Gabapentin. She denies CP/dyspnea/dizziness/HA/palpitations. CVA Nov 2018 She was started on Plavix 75mg  QD Nov 2018 Loop Recorder monitoring has been ongoing since then She is followed by Dr. Melynda Ripple annually, she needs to continue this Referral to Neurology placed Dec 2018 She reports that she stopped her Plavix  Recently and states "I didn't think I needed to be on it". She never followed up with Neurology due to change in insurance, we discussed the importance of her following up with this speciality  She continues to abstain from tobacco/ETOH use. She estimates to walk an hr/day aided by her walker She reports feeling "pretty good overall but not happy about gaining a few pounds recently". She reports continued strife within home with her daughter-in-law over finances and home chores. Mr. Dedic was provided list of local social services today to aid her in securing dwelling of her own and funding to pay for food, medications, etc She reports feeling safe at home, just wants to "be away from the drama and all of the arguing".   Patient Care Team    Relationship Specialty Notifications Start End  Esaw Grandchild, NP PCP - General Family Medicine  05/26/16     Patient Active Problem List   Diagnosis Date Noted  . Acute pain of right knee 11/09/2017  . Knee swelling 11/09/2017  . Restless leg syndrome 07/28/2017  . Low serum creatine 06/30/2017  . Acute bacterial conjunctivitis of left eye 06/30/2017  . Insomnia 04/01/2017  . Cerebral aneurysm   . Stroke ([place]) 03/15/2017  . Paresthesia of both hands 03/09/2017  . Hx of transient ischemic attack (TIA) 03/09/2017  . Fibromyalgia 01/22/2017  . Breast pain, left 12/15/2016  .  Generalized weakness 12/15/2016  . Leg pain, bilateral 12/15/2016  . 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 Hoarseness 10/08/2016  . Laryngopharyngeal reflux (LPR) 10/08/2016  . Centrilobular emphysema ([place]) 09/09/2016  . GERD (gastroesophageal reflux disease) 06/30/2016  . Dysphagia 06/30/2016  . Health care maintenance 06/25/2016  . Other fatigue 05/26/2016  . Hypertension 05/26/2016  . Other hyperlipidemia 05/26/2016  . Family history of diabetes mellitus in mother 05/26/2016  . Acute pain of right shoulder 05/26/2016  . Tobacco use disorder 05/26/2016  . Dyspnea on exertion 05/26/2016  . Chronic pain of right knee 05/26/2016  . History of MI (myocardial infarction) 05/26/2016  . History of TIA (transient ischemic attack) 05/26/2016  . Numbness and tingling of left lower extremity 05/26/2016  . Numbness and tingling in left hand 05/26/2016  . 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 Allergy   . Anginal pain ([place])   . Arthritis   . 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 Closed fracture of right patella   . Emphysema of lung ([place])   . GERD (gastroesophageal reflux disease)   . Heart attack ([place]) 1999  . Hypertension   . Osteopenia 12/2016   T score -1.2 FRAX 7.4%/0.8%  . Seborrheic keratoses   . Stroke ([place])    TIA x 2, early 2000's   . Vocal cord polyp      Past Surgical History:  Procedure Laterality Date  . BREAST BIOPSY    .  BREAST SURGERY     Left Breast nodule markers  . CARDIAC CATHETERIZATION     1999  . caridac cath    . LOOP RECORDER INSERTION N/A 03/17/2017   Procedure: LOOP RECORDER INSERTION;  Surgeon: Evans Lance, MD;  Location: [facility];  Service: Cardiovascular;  Laterality: N/A;  . lt. ovary removed    . MICROLARYNGOSCOPY WITH LASER Bilateral 11/13/2016   Procedure: MICROLARYNGOSCOPY;  Surgeon: Melissa Montane, MD;  Location: [facility] OR;  Service: ENT;  Laterality: Bilateral;  . polyp removal  10/2016   throat -   .  SKIN BIOPSY     nose  . TEE WITHOUT CARDIOVERSION N/A 03/17/2017   Procedure: TRANSESOPHAGEAL ECHOCARDIOGRAM (TEE);  Surgeon: Dorothy Spark, MD;  Location: [facility];  Service: Cardiovascular;  Laterality: N/A;  . TUBAL LIGATION       Family History  Problem Relation Age of Onset  . Diabetes Mother   . Hypertension Mother   . Stroke Mother   . Alcohol abuse Father   . Multiple sclerosis Sister   . Cancer Brother        lung  . Stroke Maternal Grandmother   . Heart attack Maternal Grandmother   . Hypertension Maternal Grandmother   . Colon cancer Neg Hx   . Colon polyps Neg Hx   . Esophageal cancer Neg Hx   . Stomach cancer Neg Hx   . Rectal cancer Neg Hx      Social History   Substance and Sexual Activity  Drug Use No     Social History   Substance and Sexual Activity  Alcohol Use No     Social History   Tobacco Use  Smoking Status Current Every Day Smoker  . Packs/day: 0.50  . Years: 40.00  . Pack years: 20.00  . Types: Cigarettes  . Start date: 09/27/1976  Smokeless Tobacco Never Used  Tobacco Comment   Peak rate of 1ppd     Outpatient Encounter Medications as of 01/27/2018  Medication Sig  . albuterol (PROVENTIL HFA;VENTOLIN HFA) 108 (90 Base) MCG/ACT inhaler Inhale 2 puffs into the lungs every 4 (four) hours as needed for wheezing or shortness of breath.  Marland Kitchen amLODipine (NORVASC) 2.5 MG tablet Take 1 tablet (2.5 mg total) by mouth daily.  Marland Kitchen atorvastatin (LIPITOR) 40 MG tablet Take 1 tablet (40 mg total) by mouth daily at 6 PM.  . DULoxetine (CYMBALTA) 30 MG capsule Take 1 capsule (30 mg total) by mouth 2 (two) times daily.  Marland Kitchen gabapentin (NEURONTIN) 300 MG capsule 1 cap at bedtime  . losartan (COZAAR) 100 MG tablet Take 1 tablet (100 mg total) by mouth daily.  . Multiple Vitamin (MULTIVITAMIN WITH MINERALS) TABS tablet Take 1 tablet daily by mouth.  . Spacer/Aero-Holding Chambers (AEROCHAMBER MV) inhaler Use as instructed  .  umeclidinium-vilanterol (ANORO ELLIPTA) 62.5-25 MCG/INH AEPB Inhale 1 puff daily into the lungs.  Marland Kitchen UNABLE TO FIND Med Name: Topricin Pain Relief Cream, Topricin Fibro Cream, Topricin Foot Therapy Cream  . verapamil (CALAN-SR) 180 MG CR tablet Take 1 tablet (180 mg total) by mouth daily.  . clopidogrel (PLAVIX) 75 MG tablet Take 1 tablet (75 mg total) by mouth daily.  . [DISCONTINUED] clopidogrel (PLAVIX) 75 MG tablet Take 1 tablet (75 mg total) by mouth daily.  . [DISCONTINUED] traZODone (DESYREL) 50 MG tablet Take 1 tablet (50 mg total) by mouth at bedtime as needed for sleep.   No facility-administered encounter medications on file as of 01/27/2018.  Allergies: Codeine and Bupropion  Body mass index is 30.6 kg/m.  Blood pressure 114/73, pulse 78, height 5' 0.75" (1.543 m), weight 160 lb 9.6 oz (72.8 kg), SpO2 95 %.     Review of Systems  Constitutional: Positive for fatigue. Negative for activity change, appetite change, chills, diaphoresis, fever and unexpected weight change.  HENT: Positive for congestion, postnasal drip, sinus pressure, sneezing and voice change. Negative for sore throat.   Eyes: Negative for visual disturbance.  Respiratory: Positive for cough. Negative for chest tightness, shortness of breath, wheezing and stridor.   Cardiovascular: Negative for chest pain, palpitations and leg swelling.  Musculoskeletal: Positive for arthralgias, back pain, gait problem, joint swelling, myalgias, neck pain and neck stiffness.  Neurological: Negative for dizziness and headaches.  Hematological: Does not bruise/bleed easily.  Psychiatric/Behavioral: Negative for decreased concentration, dysphoric mood, hallucinations, self-injury, sleep disturbance and suicidal ideas. The patient is nervous/anxious. The patient is not hyperactive.        Objective:   Physical Exam  Constitutional: She is oriented to person, place, and time. She appears well-developed and well-nourished.  No distress.  HENT:  Head: Normocephalic and atraumatic.  Right Ear: External ear normal. Tympanic membrane is bulging. Tympanic membrane is not perforated. No decreased hearing is noted.  Left Ear: External ear normal. Tympanic membrane is bulging. Tympanic membrane is not perforated. No decreased hearing is noted.  Nose: Mucosal edema and rhinorrhea present. Right sinus exhibits no maxillary sinus tenderness and no frontal sinus tenderness. Left sinus exhibits no maxillary sinus tenderness and no frontal sinus tenderness.  Mouth/Throat: Uvula is midline and mucous membranes are normal. Abnormal dentition. Posterior oropharyngeal edema and posterior oropharyngeal erythema present. No oropharyngeal exudate or tonsillar abscesses. Tonsils are 0 on the right. Tonsils are 0 on the left.  Eyes: Pupils are equal, round, and reactive to light. Conjunctivae and EOM are normal.  Cardiovascular: Normal rate, regular rhythm, normal heart sounds and intact distal pulses.  No murmur heard. Pulmonary/Chest: Effort normal and breath sounds normal. No respiratory distress.  Neurological: She is alert and oriented to person, place, and time.  Skin: Skin is warm and dry. Capillary refill takes less than 2 seconds. No rash noted. She is not diaphoretic. No erythema. No pallor.  Psychiatric: She has a normal mood and affect. Her behavior is normal. Judgment and thought content normal.  Nursing note and vitals reviewed.     Assessment & Plan:   1. Health care maintenance   2. Fibromyalgia   3. Hoarseness   4. Hx of transient ischemic attack (TIA)   5. Restless leg syndrome   6. Essential hypertension     Health care maintenance Continue all medications as directed. Continue to drink plenty of water and follow Mediterranean diet. Continue daily walking- great job on the hour a day! If nasal drainage and cough continue into next week, please call clinic and we will send in antibiotic. If you need any  assistance with living forms, please call clinic. Please schedule complete physical with fasting labs in 6 months.  Hypertension BP at goal 114/73, HR 78 Continue almodipine 2.5mg  QD, Losartan 100mg  QD Followed by Dr. Gwenlyn Found annually   Fibromyalgia Stable Currently on Duloxetine 30mg  BID  Hoarseness Chronic  Hx of transient ischemic attack (TIA) Re-started on plavix 75mg  Stressed the importance on remaining if this rx and seeing a Neurologist   Restless leg syndrome Continue Gabapentin 300mg  QHS  Pt was in the office today for 45+ minutes, I spent >75%  Of time in face to face counseling of various medical concerns and in coordination of care  FOLLOW-UP:  Return in about 6 months (around 07/29/2018) for CPE.

## 2018-01-27 NOTE — Assessment & Plan Note (Addendum)
BP at goal 114/73, HR 78 Continue almodipine 2.5mg  QD, Losartan 100mg  QD Followed by Dr. Gwenlyn Found annually

## 2018-01-28 ENCOUNTER — Telehealth: Payer: Self-pay | Admitting: Adult Health

## 2018-01-28 ENCOUNTER — Other Ambulatory Visit: Payer: Self-pay

## 2018-01-28 DIAGNOSIS — Z8673 Personal history of transient ischemic attack (TIA), and cerebral infarction without residual deficits: Secondary | ICD-10-CM

## 2018-01-28 DIAGNOSIS — I671 Cerebral aneurysm, nonruptured: Secondary | ICD-10-CM

## 2018-01-28 NOTE — Telephone Encounter (Signed)
Patient states message left from medical assistant to call office-- Forwarding message to medical asst.  --glh

## 2018-01-28 NOTE — Assessment & Plan Note (Signed)
Continue Gabapentin 300mg  QHS

## 2018-01-28 NOTE — Telephone Encounter (Signed)
See phone note from 01/27/18.  Charyl Bigger, CMA

## 2018-01-28 NOTE — Assessment & Plan Note (Signed)
Stable Currently on Duloxetine 30mg  BID

## 2018-01-28 NOTE — Telephone Encounter (Signed)
Patient states Heidi Foster told her to call back after lunch. --forwarding message to medical asst.  -glh

## 2018-01-28 NOTE — Assessment & Plan Note (Signed)
Chronic. 

## 2018-01-28 NOTE — Telephone Encounter (Signed)
Heidi Foster, Can you please all [name]- She needs to f/u with Neurology and also ask if she needs any help with the assistance forms you provided her yesterday Thanks! Valetta Fuller

## 2018-01-28 NOTE — Telephone Encounter (Signed)
Pt informed.  Pt is expressed understanding and is agreeable.  New referral to neuro placed.  Pt stated that she has already contacted [employer] for assistance with completing forms and determining what services she may be eligible for.  Charyl Bigger, CMA

## 2018-01-28 NOTE — Assessment & Plan Note (Signed)
Re-started on plavix 75mg  Stressed the importance on remaining if this rx and seeing a Garment/textile technologist

## 2018-02-02 ENCOUNTER — Ambulatory Visit (INDEPENDENT_AMBULATORY_CARE_PROVIDER_SITE_OTHER): Payer: Medicare Other | Admitting: *Deleted

## 2018-02-02 DIAGNOSIS — I639 Cerebral infarction, unspecified: Secondary | ICD-10-CM | POA: Diagnosis not present

## 2018-02-03 NOTE — Progress Notes (Signed)
Carelink Summary Report / Loop Recorder 

## 2018-02-15 LAB — CUP PACEART REMOTE DEVICE CHECK
Date Time Interrogation Session: 20191008164020
MDC IDC PG IMPLANT DT: 20181120

## 2018-03-08 ENCOUNTER — Ambulatory Visit (INDEPENDENT_AMBULATORY_CARE_PROVIDER_SITE_OTHER): Payer: Medicare Other | Admitting: *Deleted

## 2018-03-08 DIAGNOSIS — I639 Cerebral infarction, unspecified: Secondary | ICD-10-CM

## 2018-03-08 DIAGNOSIS — I10 Essential (primary) hypertension: Secondary | ICD-10-CM

## 2018-03-08 NOTE — Progress Notes (Signed)
Carelink Summary Report / Loop Recorder 

## 2018-03-16 DIAGNOSIS — Z23 Encounter for immunization: Secondary | ICD-10-CM | POA: Diagnosis not present

## 2018-04-09 ENCOUNTER — Ambulatory Visit (INDEPENDENT_AMBULATORY_CARE_PROVIDER_SITE_OTHER): Payer: Medicare Other

## 2018-04-09 DIAGNOSIS — I639 Cerebral infarction, unspecified: Secondary | ICD-10-CM | POA: Diagnosis not present

## 2018-04-12 ENCOUNTER — Telehealth: Payer: Self-pay | Admitting: Adult Health

## 2018-04-12 MED ORDER — DULOXETINE HCL 30 MG PO CPEP: 30.0000 mg | ORAL_CAPSULE | Freq: Two times a day (BID) | ORAL | 0 refills | Status: DC

## 2018-04-12 MED ORDER — LOSARTAN POTASSIUM 100 MG PO TABS: 100.0000 mg | ORAL_TABLET | Freq: Every day | ORAL | 0 refills | Status: DC

## 2018-04-12 MED ORDER — AMLODIPINE BESYLATE 2.5 MG PO TABS: 2.5000 mg | ORAL_TABLET | Freq: Every day | ORAL | 0 refills | Status: DC

## 2018-04-12 NOTE — Telephone Encounter (Signed)
Pt called states she is soon to be switched to medicare replacement plan ( per pt doesn't hv $$ to come in for required OV until Jan, after switch) ---Pt needs just enough Rxs to her appt on Jan 9th 2020 with provider.  Refill requested for these Rxs :   1)--amLODipine (NORVASC) 2.5 MG tablet [[id number]]   Order Details  Dose: 2.5 mg Route: Oral Frequency: Daily  Indications of Use: Hypertension  Dispense Quantity: 90 tablet Refills: 0 Fills remaining: --        Sig: Take 1 tablet (2.5 mg total) by mouth daily.          2)--gabapentin (NEURONTIN) 300 MG capsule [[id number]]   Order Details  Dose, Route, Frequency: As Directed   Indications of Use: Fibromyalgia Syndrome, Restless Leg Syndrome  Dispense Quantity: 90 capsule Refills: 3 Fills remaining: --        Sig: 1 cap at bedtime              3)--- DULoxetine (CYMBALTA) 30 MG capsule [[id number]]   Order Details  Dose: 30 mg Route: Oral Frequency: 2 times daily  Indications of Use: Fibromyalgia Syndrome, Musculoskeletal Pain  Dispense Quantity: 180 capsule Refills: 0 Fills remaining: --        Sig: Take 1 capsule (30 mg total) by mouth 2 (two) times daily.     4)---losartan (COZAAR) 100 MG tablet [[id number]]   Order Details  Dose: 100 mg Route: Oral Frequency: Daily  Dispense Quantity: 90 tablet Refills: 0 Fills remaining: --      5)----verapamil (CALAN-SR) 180 MG CR tablet [[id number]]   Order Details  Dose: 180 mg Route: Oral Frequency: Daily  Dispense Quantity: 90 tablet Refills: 1 Fills remaining: --        Sig: Take 1 tablet (180 mg total) by mouth daily.     ----Forwarding request to medical assistant that if approved please send to :   [facility] [address] ([address]), [place] [phone] (Phone) [phone] (Fax)   --glh

## 2018-04-12 NOTE — Progress Notes (Signed)
Carelink Summary Report / Loop Recorder 

## 2018-04-12 NOTE — Telephone Encounter (Signed)
Pt informed of refills.  Charyl Bigger, CMA

## 2018-04-24 LAB — CUP PACEART REMOTE DEVICE CHECK
Date Time Interrogation Session: 20191110170607
MDC IDC PG IMPLANT DT: 20181120

## 2018-05-04 NOTE — Progress Notes (Signed)
Subjective:    Patient ID: Heidi Foster, female    DOB: 1955/10/08, 63 y.o.   MRN: [id number]  HPI:  Heidi Foster presents with increase acute anxiety and insomnia anxiety r/t tensions with daughter-in-law. She denies thoughts of harming herself/others She reports that her grandson has also been acting out and continues to defecate in his clothes on a daily basis She continues to abstain from tobacco use- instead she has been reading and knitting to keep her hands busy. She remains active with house/yard work, continues to drink >60 oz water/day She reports fibromyalgia pain is stable with duloxetine 30mg  BID She has been slowly saving money in hopes of moving out soon.  Patient Care Team    Relationship Specialty Notifications Start End  Esaw Grandchild, NP PCP - General Family Medicine  05/26/16     Patient Active Problem List   Diagnosis Date Noted  . Acute pain of right knee 11/09/2017  . Knee swelling 11/09/2017  . Restless leg syndrome 07/28/2017  . Low serum creatine 06/30/2017  . Acute bacterial conjunctivitis of left eye 06/30/2017  . Insomnia 04/01/2017  . Cerebral aneurysm   . Stroke ([place]) 03/15/2017  . Paresthesia of both hands 03/09/2017  . Hx of transient ischemic attack (TIA) 03/09/2017  . Fibromyalgia 01/22/2017  . Breast pain, left 12/15/2016  . Generalized weakness 12/15/2016  . Leg pain, bilateral 12/15/2016  . 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 Hoarseness 10/08/2016  . Laryngopharyngeal reflux (LPR) 10/08/2016  . Centrilobular emphysema ([place]) 09/09/2016  . GERD (gastroesophageal reflux disease) 06/30/2016  . Dysphagia 06/30/2016  . Health care maintenance 06/25/2016  . Other fatigue 05/26/2016  . Hypertension 05/26/2016  . Other hyperlipidemia 05/26/2016  . Family history of diabetes mellitus in mother 05/26/2016  . Acute pain of right shoulder 05/26/2016  . Tobacco use disorder 05/26/2016  . Dyspnea on exertion 05/26/2016  . Chronic pain of right knee  05/26/2016  . History of MI (myocardial infarction) 05/26/2016  . History of TIA (transient ischemic attack) 05/26/2016  . Numbness and tingling of left lower extremity 05/26/2016  . Numbness and tingling in left hand 05/26/2016  . 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 Allergy   . Anginal pain ([place])   . Arthritis   . 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 Closed fracture of right patella   . Emphysema of lung ([place])   . GERD (gastroesophageal reflux disease)   . Heart attack ([place]) 1999  . Hypertension   . Osteopenia 12/2016   T score -1.2 FRAX 7.4%/0.8%  . Seborrheic keratoses   . Stroke ([place])    TIA x 2, early 2000's   . Vocal cord polyp      Past Surgical History:  Procedure Laterality Date  . BREAST BIOPSY    . BREAST SURGERY     Left Breast nodule markers  . CARDIAC CATHETERIZATION     1999  . caridac cath    . LOOP RECORDER INSERTION N/A 03/17/2017   Procedure: LOOP RECORDER INSERTION;  Surgeon: Evans Lance, MD;  Location: [place] [facility];  Service: Cardiovascular;  Laterality: N/A;  . lt. ovary removed    . MICROLARYNGOSCOPY WITH LASER Bilateral 11/13/2016   Procedure: MICROLARYNGOSCOPY;  Surgeon: Melissa Montane, MD;  Location[facility];  Service: ENT;  Laterality: Bilateral;  . polyp removal  10/2016   throat -   . SKIN BIOPSY  nose  . TEE WITHOUT CARDIOVERSION N/A 03/17/2017   Procedure: TRANSESOPHAGEAL ECHOCARDIOGRAM (TEE);  Surgeon: Dorothy Spark, MD;  Location: [facility];  Service: Cardiovascular;  Laterality: N/A;  . TUBAL LIGATION       Family History  Problem Relation Age of Onset  . Diabetes Mother   . Hypertension Mother   . Stroke Mother   . Alcohol abuse Father   . Multiple sclerosis Sister   . Cancer Brother        lung  . Stroke Maternal Grandmother   . Heart attack Maternal Grandmother   . Hypertension Maternal Grandmother   . Colon cancer Neg Hx   . Colon polyps Neg Hx   . Esophageal cancer  Neg Hx   . Stomach cancer Neg Hx   . Rectal cancer Neg Hx      Social History   Substance and Sexual Activity  Drug Use No     Social History   Substance and Sexual Activity  Alcohol Use No     Social History   Tobacco Use  Smoking Status Current Every Day Smoker  . Packs/day: 0.50  . Years: 40.00  . Pack years: 20.00  . Types: Cigarettes  . Start date: 09/27/1976  Smokeless Tobacco Never Used  Tobacco Comment   Peak rate of 1ppd     Outpatient Encounter Medications as of 05/06/2018  Medication Sig  . albuterol (PROVENTIL HFA;VENTOLIN HFA) 108 (90 Base) MCG/ACT inhaler Inhale 2 puffs into the lungs every 4 (four) hours as needed for wheezing or shortness of breath.  Marland Kitchen amLODipine (NORVASC) 2.5 MG tablet Take 1 tablet (2.5 mg total) by mouth daily.  Marland Kitchen atorvastatin (LIPITOR) 40 MG tablet Take 1 tablet (40 mg total) by mouth daily at 6 PM.  . clopidogrel (PLAVIX) 75 MG tablet Take 1 tablet (75 mg total) by mouth daily.  . DULoxetine (CYMBALTA) 30 MG capsule Take 1 capsule (30 mg total) by mouth 2 (two) times daily.  Marland Kitchen gabapentin (NEURONTIN) 300 MG capsule 1 cap at bedtime  . losartan (COZAAR) 100 MG tablet Take 1 tablet (100 mg total) by mouth daily.  . Multiple Vitamin (MULTIVITAMIN WITH MINERALS) TABS tablet Take 1 tablet daily by mouth.  . Spacer/Aero-Holding Chambers (AEROCHAMBER MV) inhaler Use as instructed  . umeclidinium-vilanterol (ANORO ELLIPTA) 62.5-25 MCG/INH AEPB Inhale 1 puff daily into the lungs.  Marland Kitchen UNABLE TO FIND Med Name: Topricin Pain Relief Cream, Topricin Fibro Cream, Topricin Foot Therapy Cream  . verapamil (CALAN-SR) 180 MG CR tablet Take 1 tablet (180 mg total) by mouth daily.  . [DISCONTINUED] amLODipine (NORVASC) 2.5 MG tablet Take 1 tablet (2.5 mg total) by mouth daily.  . [DISCONTINUED] atorvastatin (LIPITOR) 40 MG tablet Take 1 tablet (40 mg total) by mouth daily at 6 PM.  . [DISCONTINUED] clopidogrel (PLAVIX) 75 MG tablet Take 1 tablet (75 mg  total) by mouth daily.  . [DISCONTINUED] DULoxetine (CYMBALTA) 30 MG capsule Take 1 capsule (30 mg total) by mouth 2 (two) times daily.  . [DISCONTINUED] gabapentin (NEURONTIN) 300 MG capsule 1 cap at bedtime  . [DISCONTINUED] losartan (COZAAR) 100 MG tablet Take 1 tablet (100 mg total) by mouth daily.  . [DISCONTINUED] verapamil (CALAN-SR) 180 MG CR tablet Take 1 tablet (180 mg total) by mouth daily.  Marland Kitchen LORazepam (ATIVAN) 0.5 MG tablet Take 1 tablet (0.5 mg total) by mouth at bedtime.   No facility-administered encounter medications on file as of 05/06/2018.     Allergies: Codeine and Bupropion  Body mass index is 30.98 kg/m.  Blood pressure 130/72, pulse 74, temperature 98.1 F (36.7 C), temperature source Oral, height 5' 0.75" (1.543 m), weight 162 lb 9.6 oz (73.8 kg), SpO2 96 %.   Review of Systems  Constitutional: Positive for fatigue. Negative for activity change, appetite change, chills, diaphoresis, fever and unexpected weight change.  Respiratory: Positive for cough and shortness of breath. Negative for chest tightness, wheezing and stridor.        Cough/SOB is her baseline  Cardiovascular: Negative for chest pain, palpitations and leg swelling.  Gastrointestinal: Negative for abdominal distention, abdominal pain, blood in stool, constipation, diarrhea, nausea and vomiting.  Endocrine: Negative for cold intolerance, heat intolerance, polydipsia, polyphagia and polyuria.  Genitourinary: Negative for difficulty urinating and flank pain.  Musculoskeletal: Positive for arthralgias, back pain, gait problem, joint swelling, myalgias, neck pain and neck stiffness.  Neurological: Positive for headaches. Negative for dizziness.       Tension HA  Hematological: Does not bruise/bleed easily.  Psychiatric/Behavioral: Positive for sleep disturbance. Negative for agitation, behavioral problems, confusion, decreased concentration, dysphoric mood, hallucinations, self-injury and suicidal  ideas. The patient is nervous/anxious. The patient is not hyperactive.        Objective:   Physical Exam Vitals signs and nursing note reviewed.  Constitutional:      General: She is not in acute distress.    Appearance: She is normal weight. She is not ill-appearing, toxic-appearing or diaphoretic.  HENT:     Head: Normocephalic and atraumatic.     Nose: Nose normal.  Eyes:     Extraocular Movements: Extraocular movements intact.     Conjunctiva/sclera: Conjunctivae normal.     Pupils: Pupils are equal, round, and reactive to light.  Cardiovascular:     Rate and Rhythm: Normal rate.     Pulses: Normal pulses.     Heart sounds: Normal heart sounds. No murmur. No friction rub. No gallop.   Skin:    General: Skin is warm.     Capillary Refill: Capillary refill takes less than 2 seconds.  Neurological:     Mental Status: She is alert and oriented to person, place, and time.  Psychiatric:        Attention and Perception: Attention and perception normal.        Mood and Affect: Mood is anxious.        Speech: Speech normal.        Behavior: Behavior normal.        Thought Content: Thought content normal.        Cognition and Memory: Cognition and memory normal.        Judgment: Judgment normal.       Assessment & Plan:   1. Insomnia, unspecified type   2. Fibromyalgia   3. Essential hypertension     Insomnia [place] Controlled Substance Database reviewed- no aberrancies noted Insomnia r/t acute stress in home, re: tension with daughter in law Lorazepam 0.5mg  QHS PRN  Fibromyalgia Managed on Duloxetine on 30mg  BID   Hypertension BP at goal 130/72, HR 74 Continue amlodipine 2.5mg  QD, Losartan 100mg  QD    FOLLOW-UP:  Return in about 3 months (around 08/05/2018) for Fasting Labs, CPE.

## 2018-05-06 ENCOUNTER — Encounter: Payer: Self-pay | Admitting: Adult Health

## 2018-05-06 ENCOUNTER — Ambulatory Visit (INDEPENDENT_AMBULATORY_CARE_PROVIDER_SITE_OTHER): Payer: Medicare Other | Admitting: Adult Health

## 2018-05-06 DIAGNOSIS — M797 Fibromyalgia: Secondary | ICD-10-CM | POA: Diagnosis not present

## 2018-05-06 DIAGNOSIS — I10 Essential (primary) hypertension: Secondary | ICD-10-CM | POA: Diagnosis not present

## 2018-05-06 DIAGNOSIS — G4700 Insomnia, unspecified: Secondary | ICD-10-CM | POA: Diagnosis not present

## 2018-05-06 MED ORDER — LOSARTAN POTASSIUM 100 MG PO TABS: 100.0000 mg | ORAL_TABLET | Freq: Every day | ORAL | 0 refills | Status: DC

## 2018-05-06 MED ORDER — LORAZEPAM 0.5 MG PO TABS: 0.5000 mg | ORAL_TABLET | Freq: Every day | ORAL | 0 refills | Status: DC

## 2018-05-06 MED ORDER — GABAPENTIN 300 MG PO CAPS: ORAL_CAPSULE | ORAL | 3 refills | Status: DC

## 2018-05-06 MED ORDER — ATORVASTATIN CALCIUM 40 MG PO TABS: 40.0000 mg | ORAL_TABLET | Freq: Every day | ORAL | 0 refills | Status: DC

## 2018-05-06 MED ORDER — AMLODIPINE BESYLATE 2.5 MG PO TABS: 2.5000 mg | ORAL_TABLET | Freq: Every day | ORAL | 0 refills | Status: DC

## 2018-05-06 MED ORDER — DULOXETINE HCL 30 MG PO CPEP: 30.0000 mg | ORAL_CAPSULE | Freq: Two times a day (BID) | ORAL | 0 refills | Status: DC

## 2018-05-06 MED ORDER — VERAPAMIL HCL ER 180 MG PO TBCR: 180.0000 mg | EXTENDED_RELEASE_TABLET | Freq: Every day | ORAL | 1 refills | Status: DC

## 2018-05-06 MED ORDER — CLOPIDOGREL BISULFATE 75 MG PO TABS: 75.0000 mg | ORAL_TABLET | Freq: Every day | ORAL | 3 refills | Status: DC

## 2018-05-06 NOTE — Assessment & Plan Note (Signed)
[place] Controlled Substance Database reviewed- no aberrancies noted Insomnia r/t acute stress in home, re: tension with daughter in law Lorazepam 0.5mg  QHS PRN

## 2018-05-06 NOTE — Assessment & Plan Note (Signed)
BP at goal 130/72, HR 74 Continue amlodipine 2.5mg  QD, Losartan 100mg  QD

## 2018-05-06 NOTE — Patient Instructions (Addendum)
Mediterranean Diet A Mediterranean diet refers to food and lifestyle choices that are based on the traditions of countries located on the [employer]. This way of eating has been shown to help prevent certain conditions and improve outcomes for people who have chronic diseases, like kidney disease and heart disease. What are tips for following this plan? Lifestyle  Cook and eat meals together with your family, when possible.  Drink enough fluid to keep your urine clear or pale yellow.  Be physically active every day. This includes: ? Aerobic exercise like running or swimming. ? Leisure activities like gardening, walking, or housework.  Get 7-8 hours of sleep each night. Reading food labels   Check the serving size of packaged foods. For foods such as rice and pasta, the serving size refers to the amount of cooked product, not dry.  Check the total fat in packaged foods. Avoid foods that have saturated fat or trans fats.  Check the ingredients list for added sugars, such as corn syrup. Shopping  At the grocery store, buy most of your food from the areas near the walls of the store. This includes: ? Fresh fruits and vegetables (produce). ? Grains, beans, nuts, and seeds. Some of these may be available in unpackaged forms or large amounts (in bulk). ? Fresh seafood. ? Poultry and eggs. ? Low-fat dairy products.  Buy whole ingredients instead of prepackaged foods.  Buy fresh fruits and vegetables in-season from local farmers markets.  Buy frozen fruits and vegetables in resealable bags.  If you do not have access to quality fresh seafood, buy precooked frozen shrimp or canned fish, such as tuna, salmon, or sardines.  Buy small amounts of raw or cooked vegetables, salads, or olives from the deli or salad bar at your store.  Stock your pantry so you always have certain foods on hand, such as olive oil, canned tuna, canned tomatoes, rice, pasta, and beans. Cooking  Cook  foods with extra-virgin olive oil instead of using butter or other vegetable oils.  Have meat as a side dish, and have vegetables or grains as your main dish. This means having meat in small portions or adding small amounts of meat to foods like pasta or stew.  Use beans or vegetables instead of meat in common dishes like chili or lasagna.  Experiment with different cooking methods. Try roasting or broiling vegetables instead of steaming or sauteing them.  Add frozen vegetables to soups, stews, pasta, or rice.  Add nuts or seeds for added healthy fat at each meal. You can add these to yogurt, salads, or vegetable dishes.  Marinate fish or vegetables using olive oil, lemon juice, garlic, and fresh herbs. Meal planning   Plan to eat 1 vegetarian meal one day each week. Try to work up to 2 vegetarian meals, if possible.  Eat seafood 2 or more times a week.  Have healthy snacks readily available, such as: ? Vegetable sticks with hummus. ? [place] yogurt. ? Fruit and nut trail mix.  Eat balanced meals throughout the week. This includes: ? Fruit: 2-3 servings a day ? Vegetables: 4-5 servings a day ? Low-fat dairy: 2 servings a day ? Fish, poultry, or lean meat: 1 serving a day ? Beans and legumes: 2 or more servings a week ? Nuts and seeds: 1-2 servings a day ? Whole grains: 6-8 servings a day ? Extra-virgin olive oil: 3-4 servings a day  Limit red meat and sweets to only a few servings a month What are  my food choices?  Mediterranean diet ? Recommended ? Grains: Whole-grain pasta. Brown rice. Bulgar wheat. Polenta. Couscous. Whole-wheat bread. [place]. ? Vegetables: Artichokes. Beets. Broccoli. Cabbage. Carrots. Eggplant. Green beans. Chard. Kale. Spinach. Onions. Leeks. Peas. Squash. Tomatoes. Peppers. Radishes. ? Fruits: Apples. Apricots. Avocado. Berries. Bananas. Cherries. Dates. Figs. Grapes. Lemons. Melon. Oranges. Peaches. Plums. Pomegranate. ? Meats and other  protein foods: Beans. Almonds. Sunflower seeds. Pine nuts. Peanuts. [place]. Salmon. Scallops. Shrimp. [place]. Tilapia. Clams. Oysters. Eggs. ? Dairy: Low-fat milk. Cheese. Greek yogurt. ? Beverages: Water. Red wine. Herbal tea. ? Fats and oils: Extra virgin olive oil. Avocado oil. Grape seed oil. ? Sweets and desserts: [place] yogurt with honey. Baked apples. Poached pears. Trail mix. ? Seasoning and other foods: Basil. Cilantro. Coriander. Cumin. Mint. Parsley. Sage. Rosemary. Tarragon. Garlic. Oregano. Thyme. Pepper. Balsalmic vinegar. Tahini. Hummus. Tomato sauce. Olives. Mushrooms. ? Limit these ? Grains: Prepackaged pasta or rice dishes. Prepackaged cereal with added sugar. ? Vegetables: Deep fried potatoes (french fries). ? Fruits: Fruit canned in syrup. ? Meats and other protein foods: Beef. Pork. Lamb. Poultry with skin. Hot dogs. [place] Salines. ? Dairy: Ice cream. Sour cream. Whole milk. ? Beverages: Juice. Sugar-sweetened soft drinks. Beer. Liquor and spirits. ? Fats and oils: Butter. Canola oil. Vegetable oil. Beef fat (tallow). Lard. ? Sweets and desserts: Cookies. Cakes. Pies. Candy. ? Seasoning and other foods: Mayonnaise. Premade sauces and marinades. ? The items listed may not be a complete list. Talk with your dietitian about what dietary choices are right for you. Summary  The Mediterranean diet includes both food and lifestyle choices.  Eat a variety of fresh fruits and vegetables, beans, nuts, seeds, and whole grains.  Limit the amount of red meat and sweets that you eat. This information is not intended to replace advice given to you by your health care provider. Make sure you discuss any questions you have with your health care provider. Document Released: 12/06/2015 Document Revised: 01/08/2016 Document Reviewed: 12/06/2015 Elsevier Interactive Patient Education  [employer].  Please continue all medications as directed. Continue to drink plenty of water and follow  Mediterranean diet. Continue to abstain from tobacco. Follow-up in 3 months- complete physical with fasting labs. NICE TO SEE YOU!

## 2018-05-06 NOTE — Assessment & Plan Note (Signed)
Managed on Duloxetine on 30mg  BID

## 2018-05-07 LAB — COMPREHENSIVE METABOLIC PANEL
ALT: 13 IU/L (ref 0–32)
AST: 14 IU/L (ref 0–40)
Albumin/Globulin Ratio: 2.3 — ABNORMAL HIGH (ref 1.2–2.2)
Albumin: 4.3 g/dL (ref 3.6–4.8)
Alkaline Phosphatase: 113 IU/L (ref 39–117)
BUN/Creatinine Ratio: 24 (ref 12–28)
BUN: 12 mg/dL (ref 8–27)
Bilirubin Total: <0.2 mg/dL (ref 0.0–1.2)
CALCIUM: 9.5 mg/dL (ref 8.7–10.3)
CO2: 23 mmol/L (ref 20–29)
Chloride: 106 mmol/L (ref 96–106)
Creatinine, Ser: 0.51 mg/dL — ABNORMAL LOW (ref 0.57–1.00)
GFR calc non Af Amer: 104 mL/min/{1.73_m2} (ref >59)
GFR, EST AFRICAN AMERICAN: 120 mL/min/{1.73_m2} (ref >59)
Globulin, Total: 1.9 g/dL (ref 1.5–4.5)
Glucose: 79 mg/dL (ref 65–99)
Potassium: 4.1 mmol/L (ref 3.5–5.2)
Sodium: 141 mmol/L (ref 134–144)
TOTAL PROTEIN: 6.2 g/dL (ref 6.0–8.5)

## 2018-05-09 ENCOUNTER — Other Ambulatory Visit: Payer: Self-pay | Admitting: Adult Health

## 2018-05-12 ENCOUNTER — Ambulatory Visit (INDEPENDENT_AMBULATORY_CARE_PROVIDER_SITE_OTHER): Payer: Medicare Other

## 2018-05-12 DIAGNOSIS — I639 Cerebral infarction, unspecified: Secondary | ICD-10-CM | POA: Diagnosis not present

## 2018-05-13 ENCOUNTER — Other Ambulatory Visit: Payer: Self-pay | Admitting: Adult Health

## 2018-05-13 NOTE — Progress Notes (Signed)
Carelink Summary Report / Loop Recorder 

## 2018-05-14 LAB — CUP PACEART REMOTE DEVICE CHECK
Date Time Interrogation Session: 20200115170739
Implantable Pulse Generator Implant Date: 20181120

## 2018-05-16 LAB — CUP PACEART REMOTE DEVICE CHECK
Date Time Interrogation Session: 20191213170928
MDC IDC PG IMPLANT DT: 20181120

## 2018-05-31 NOTE — Progress Notes (Signed)
Received Epic notification that pt has not read MyChart message regarding results.     This message is to inform you that the patient has not yet read the following message. (Notification date: May 24, 2018)  Recent lab resultsq   From Fonnie Mu, CMA To Renda Pohlman Sent 05/10/2018 10:34 AM  [name] asked that I inform you that your recent labs were normal.   Wishing you well,  Kenney Houseman, CMA for  Mina Marble, NP   [address]   MyChart User Last Read On  Heidi Foster Not Read     Letter mailed to pt.   Charyl Bigger, CMA

## 2018-06-07 ENCOUNTER — Telehealth: Payer: Self-pay | Admitting: Adult Health

## 2018-06-07 MED ORDER — LOSARTAN POTASSIUM 100 MG PO TABS: 100.0000 mg | ORAL_TABLET | Freq: Every day | ORAL | 0 refills | Status: DC

## 2018-06-07 MED ORDER — AMLODIPINE BESYLATE 2.5 MG PO TABS: 2.5000 mg | ORAL_TABLET | Freq: Every day | ORAL | 0 refills | Status: DC

## 2018-06-07 NOTE — Addendum Note (Signed)
Addended by: Fonnie Mu on: 06/07/2018 01:55 PM   Modules accepted: Orders

## 2018-06-07 NOTE — Telephone Encounter (Signed)
Patient is requesting a refill of her losartan and amlodipine. She is requesting a 90 day supply per her insurance guidelines and if this could be sent to [facility].

## 2018-06-09 ENCOUNTER — Other Ambulatory Visit: Payer: Self-pay | Admitting: Adult Health

## 2018-06-14 ENCOUNTER — Ambulatory Visit (INDEPENDENT_AMBULATORY_CARE_PROVIDER_SITE_OTHER): Payer: Medicare Other

## 2018-06-14 DIAGNOSIS — I639 Cerebral infarction, unspecified: Secondary | ICD-10-CM

## 2018-06-14 LAB — CUP PACEART REMOTE DEVICE CHECK
Implantable Pulse Generator Implant Date: 20181120
MDC IDC SESS DTM: 20200217145414

## 2018-06-23 NOTE — Progress Notes (Signed)
Carelink Summary Report / Loop Recorder 

## 2018-07-05 ENCOUNTER — Telehealth: Payer: Self-pay | Admitting: Adult Health

## 2018-07-05 NOTE — Telephone Encounter (Signed)
Patient called state she just found out through pharmacy that ER tablet of current prescription.  (cost for 90days supply is dramatically lower than the one she is taking below & pt request New Rx be sent to [facility] for : DULoxetine (CYMBALTA) 30 MG capsule [[id number]]   Order Details  Dose: 30 mg Route: Oral Frequency: 2 times daily  Indications of Use: Fibromyalgia Syndrome, Musculoskeletal Pain  Dispense Quantity: 60 capsule Refills: 0 Fills remaining: --        Sig: Take 1 capsule (30 mg total) by mouth 2 (two) times daily.           --Forwarding  request to medical assistant for New 90day Rx to :   [place], [place] [phone] (Phone) [phone] (Fax)   --glh

## 2018-07-05 NOTE — Telephone Encounter (Signed)
Please review pt request for change in medication.  Charyl Bigger, CMA

## 2018-07-07 ENCOUNTER — Telehealth: Payer: Self-pay

## 2018-07-07 ENCOUNTER — Other Ambulatory Visit: Payer: Self-pay | Admitting: Adult Health

## 2018-07-07 MED ORDER — DULOXETINE HCL 30 MG PO CPEP: ORAL_CAPSULE | ORAL | 2 refills | Status: DC

## 2018-07-07 NOTE — Telephone Encounter (Signed)
Me       07/05/18 2:18 PM  Note    Please review pt request for change in medication.  Charyl Bigger, CMA          07/05/18 10:41 AM  Elberta Spaniel routed this conversation to Me  Elberta Spaniel       07/05/18 10:41 AM  Note    Patient called state she just found out through pharmacy that ER tablet of current prescription.  (cost for 90days supply is dramatically lower than the one she is taking below & pt request New Rx be sent to [facility] for : DULoxetine (CYMBALTA) 30 MG capsule [[id number]]   Order Details       Dose: 30 mg Route: Oral Frequency: 2 times daily  Indications of Use: Fibromyalgia Syndrome, Musculoskeletal Pain  Dispense Quantity: 60 capsule Refills: 0 Fills remaining: --        Sig: Take 1 capsule (30 mg total) by mouth 2 (two) times daily.           --Forwarding  request to medical assistant for New 90day Rx to :   [place], [place] [phone] (Phone) [phone] (Fax)   --glh

## 2018-07-07 NOTE — Telephone Encounter (Signed)
Pt informed.  No RX was sent to [facility] recently.  Charyl Bigger, CMA

## 2018-07-07 NOTE — Telephone Encounter (Signed)
Heidi Foster, I sent in new rx to [facility] I sent in 180 count- take two capsules daily Please call her Heidi Foster and ensure that it did not go there the other day, if so-please cancel Thanks! Valetta Fuller

## 2018-07-19 ENCOUNTER — Ambulatory Visit (INDEPENDENT_AMBULATORY_CARE_PROVIDER_SITE_OTHER): Payer: Medicare Other | Admitting: *Deleted

## 2018-07-19 ENCOUNTER — Other Ambulatory Visit: Payer: Self-pay

## 2018-07-19 DIAGNOSIS — I639 Cerebral infarction, unspecified: Secondary | ICD-10-CM | POA: Diagnosis not present

## 2018-07-20 LAB — CUP PACEART REMOTE DEVICE CHECK
Date Time Interrogation Session: 20200321181016
Implantable Pulse Generator Implant Date: 20181120

## 2018-07-28 NOTE — Progress Notes (Signed)
Carelink Summary Report / Loop Recorder 

## 2018-08-19 ENCOUNTER — Other Ambulatory Visit: Payer: Self-pay

## 2018-08-19 ENCOUNTER — Ambulatory Visit (INDEPENDENT_AMBULATORY_CARE_PROVIDER_SITE_OTHER): Payer: Medicare Other | Admitting: *Deleted

## 2018-08-19 DIAGNOSIS — I639 Cerebral infarction, unspecified: Secondary | ICD-10-CM

## 2018-08-19 LAB — CUP PACEART REMOTE DEVICE CHECK
Date Time Interrogation Session: 20200423164311
Implantable Pulse Generator Implant Date: 20181120

## 2018-08-27 NOTE — Progress Notes (Signed)
Carelink Summary Report / Loop Recorder 

## 2018-09-01 ENCOUNTER — Telehealth: Payer: Self-pay
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
## 2018-09-19 IMAGING — DX DG CHEST 2V
2 series · 2 of 2 positions shown · non-contrast
Comparison: Low-dose screening chest CT dated 08/01/2016. Chest
radiographs dated 06/30/2016.

CLINICAL DATA: Chest pain since yesterday. The pain radiates into
the left shoulder, neck and upper back. Fatigue and shortness of
breath. Smoker.

EXAM:
CHEST  2 VIEW

[chest pa]
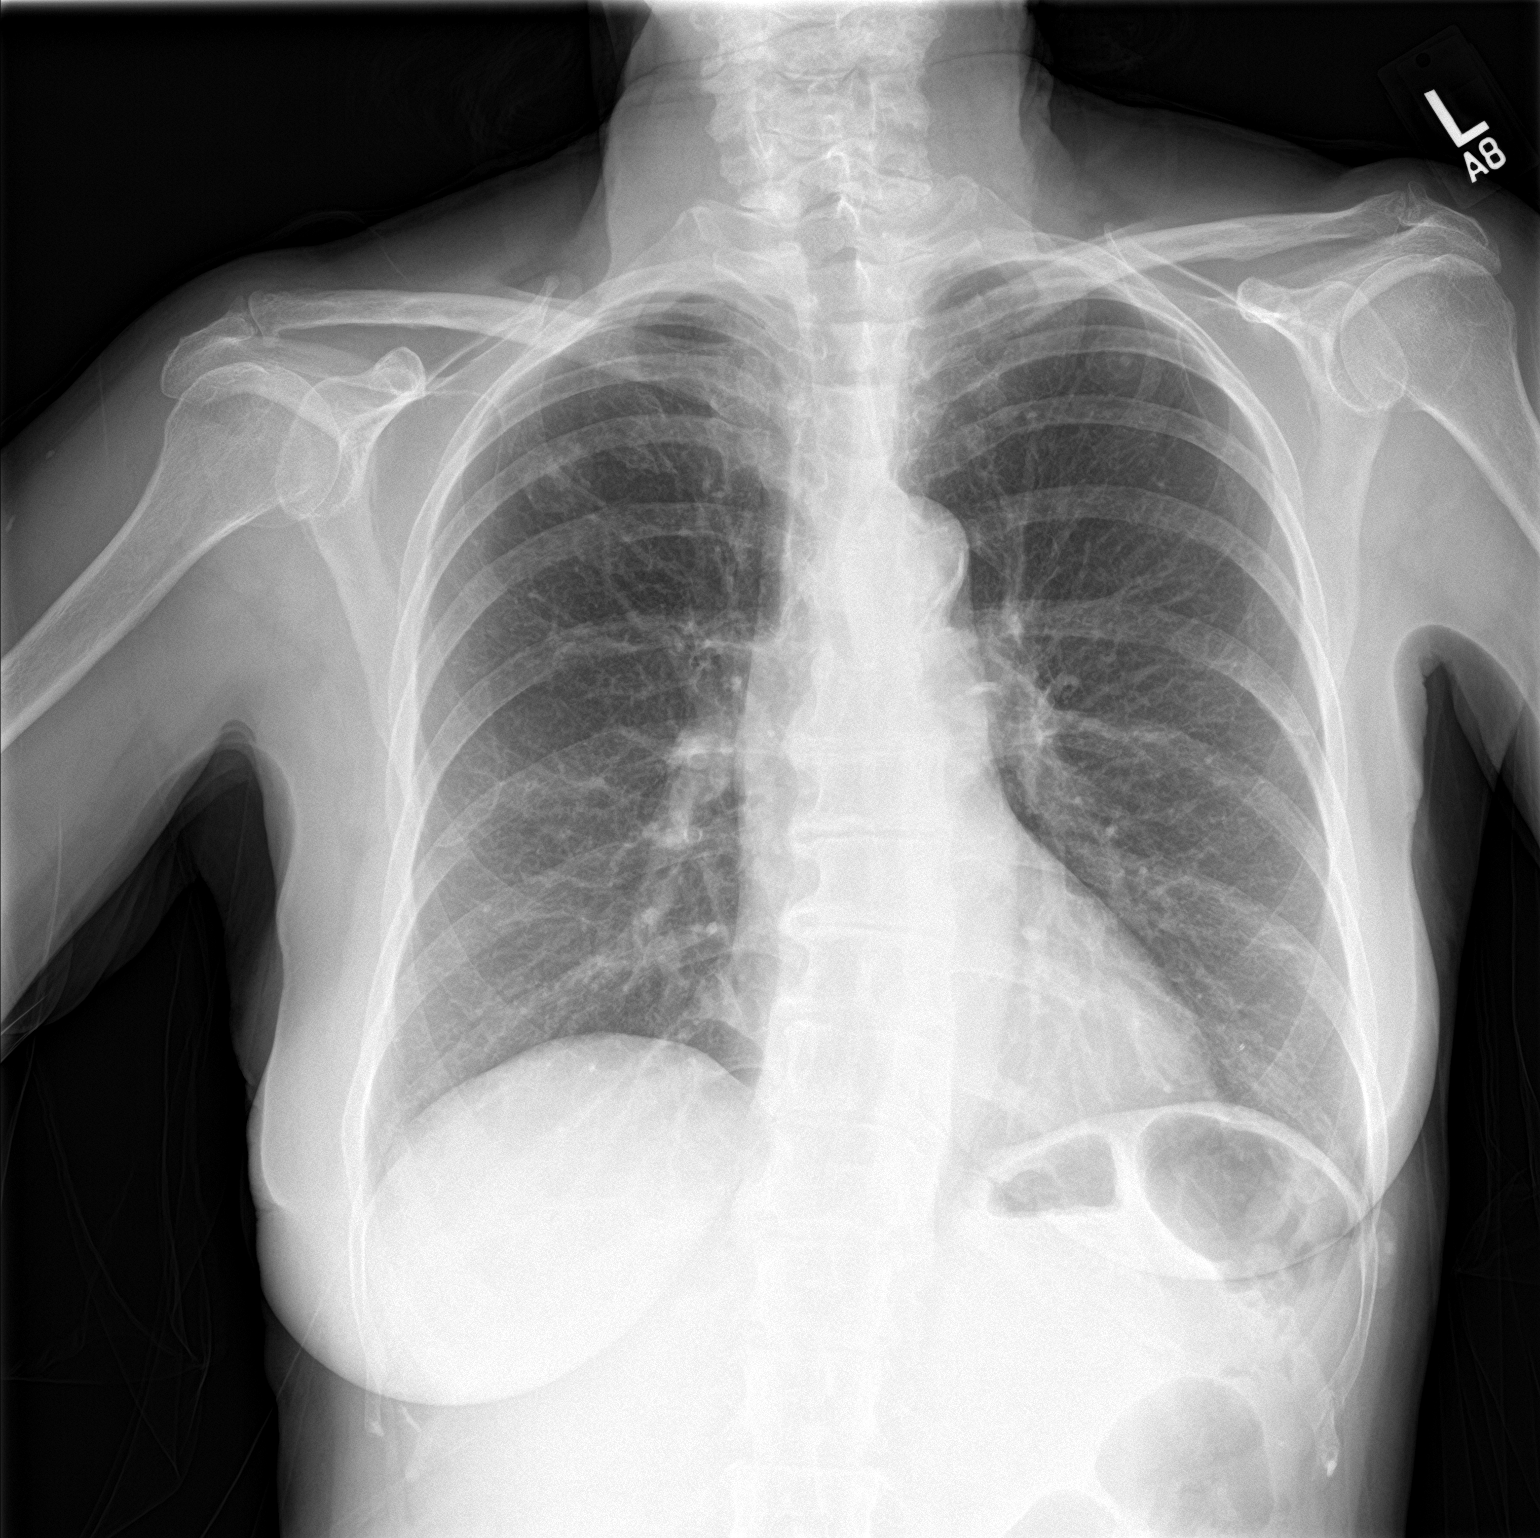

[chest lat]
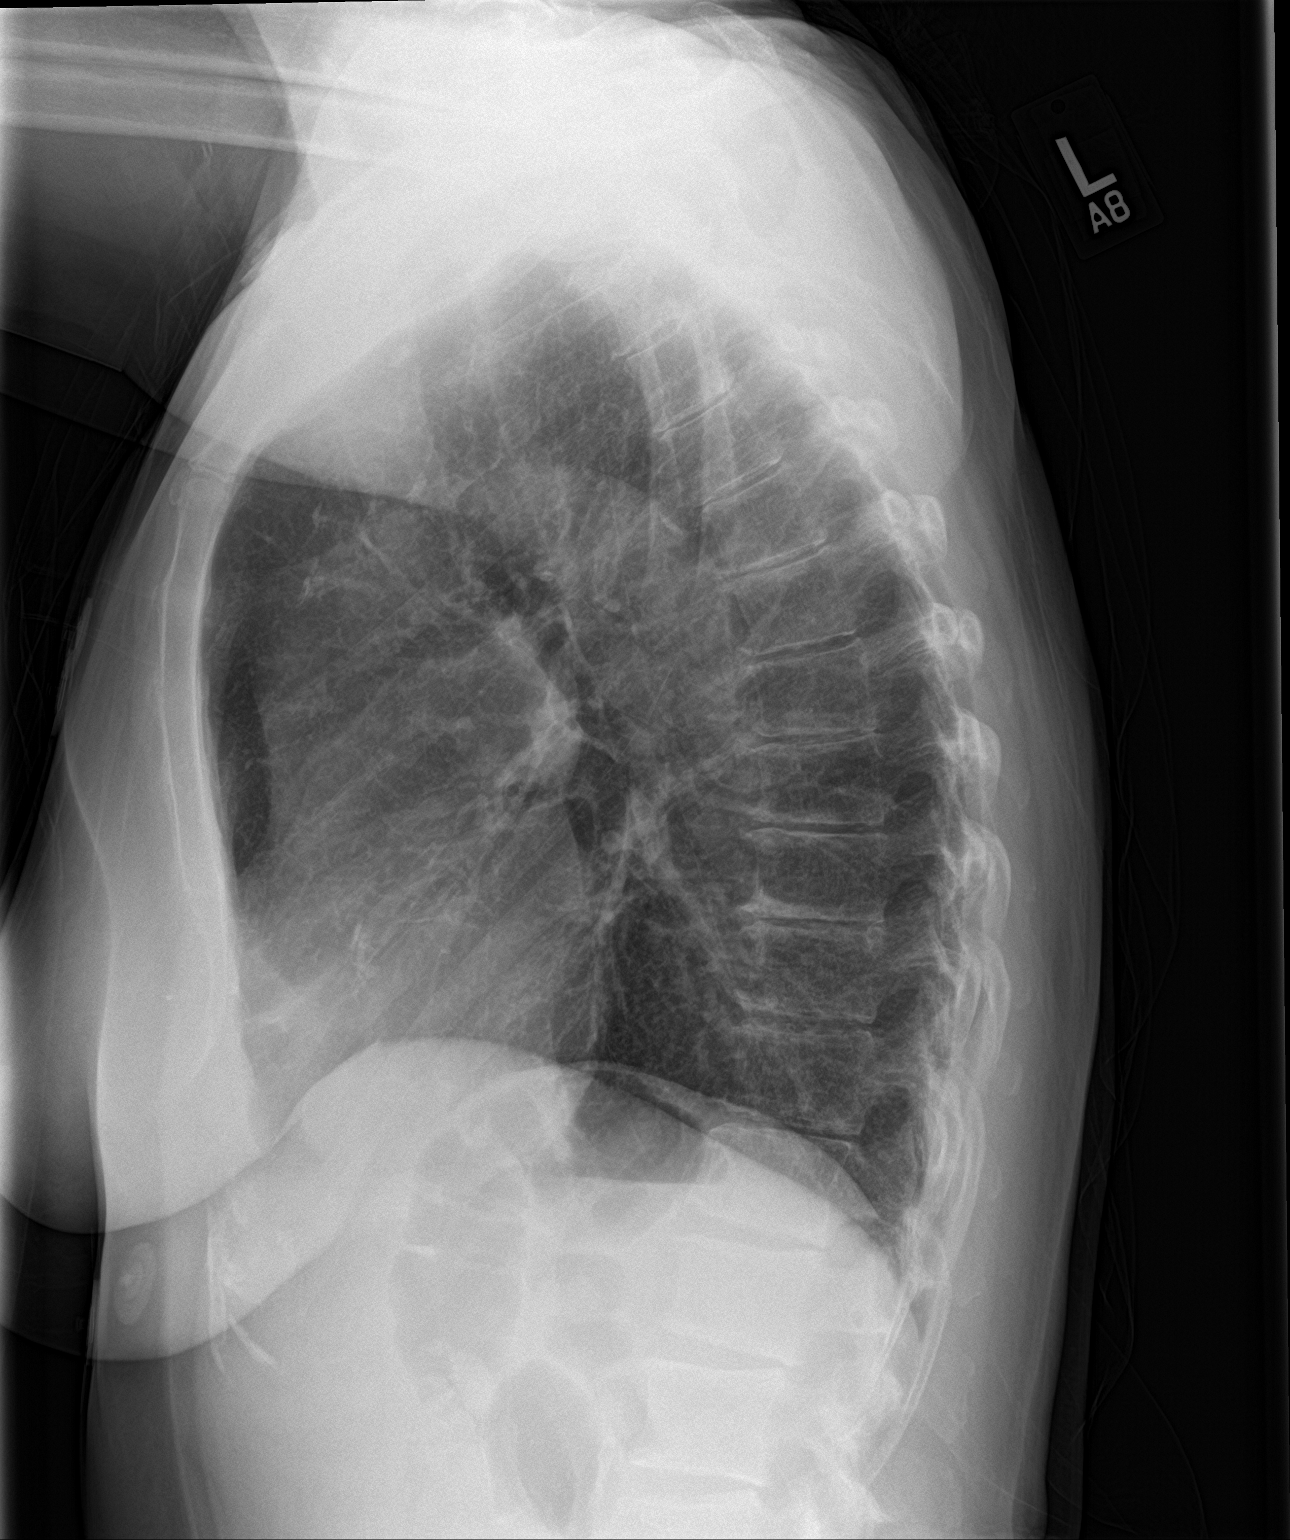

[2 of 2 positions shown; findings below may reference images not displayed]

FINDINGS: Stable normal sized heart and tortuous and partially calcified
thoracic aorta. The lungs remain clear with stable hyperinflation
and prominent interstitial markings with diffuse peribronchial
thickening. Thoracic spine degenerative changes.
IMPRESSION: No acute abnormality. Stable changes of COPD and chronic bronchitis.

## 2018-09-19 IMAGING — CT CT ANGIO CHEST
2 of 8 series · 19 of 46 positions shown · IV contrast (isovue)
Comparison: Chest radiographs obtained earlier today. Low dose
chest CT dated 08/01/2016.

CLINICAL DATA: Centralized chest pain radiating to the back and
shortness of breath since yesterday. Smoker.

EXAM:
CT ANGIOGRAPHY CHEST WITH CONTRAST
TECHNIQUE: Multidetector CT imaging of the chest was performed using the
standard protocol during bolus administration of intravenous
contrast. Multiplanar CT image reconstructions and MIPs were
obtained to evaluate the vascular anatomy.
CONTRAST:  70 cc Isovue 370

[Series 6: thins · axial · 0.64mm/px · z∈[+1390,+1630]mm · 16 of 266 slices shown]
[im 13/266  lung]
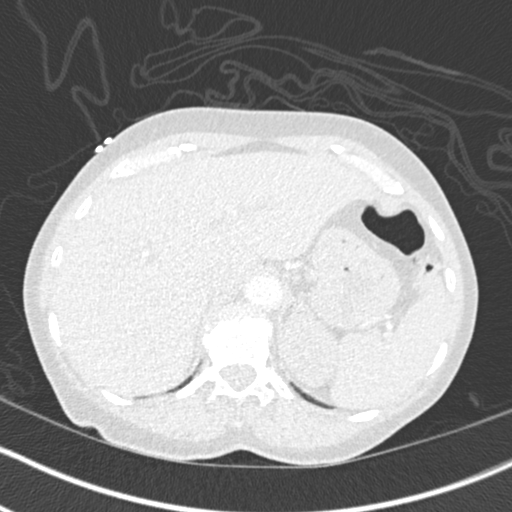
[im 25/266  soft-tissue]
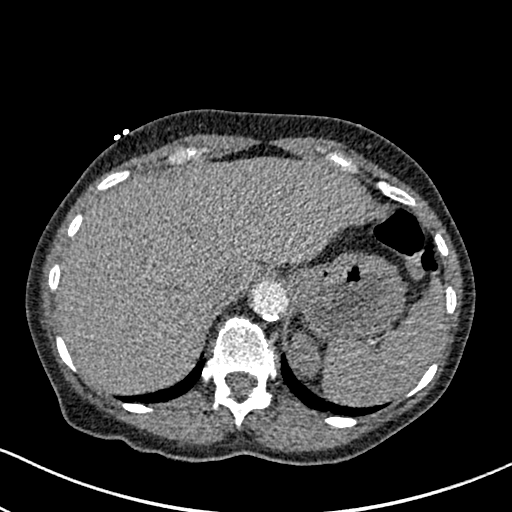
[im 49/266  lung]
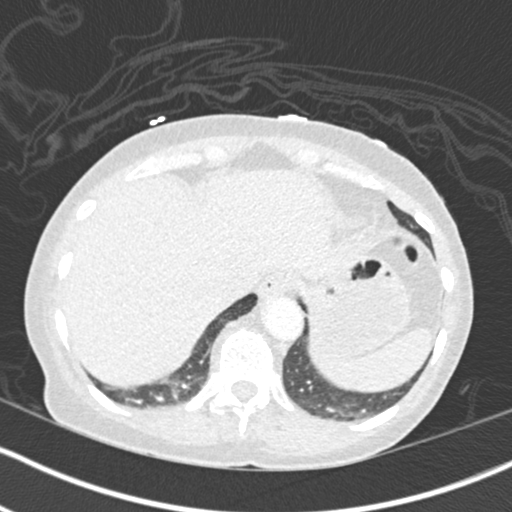
[im 61/266  soft-tissue]
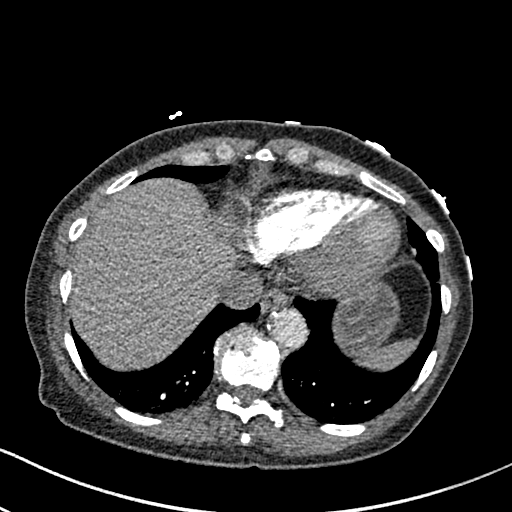
[im 73/266  lung]
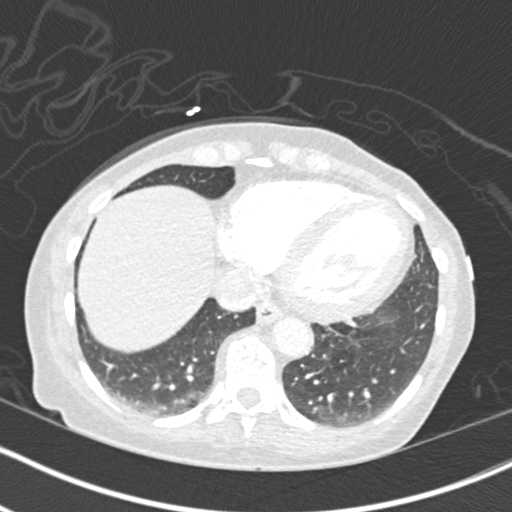
[im 97/266  soft-tissue]
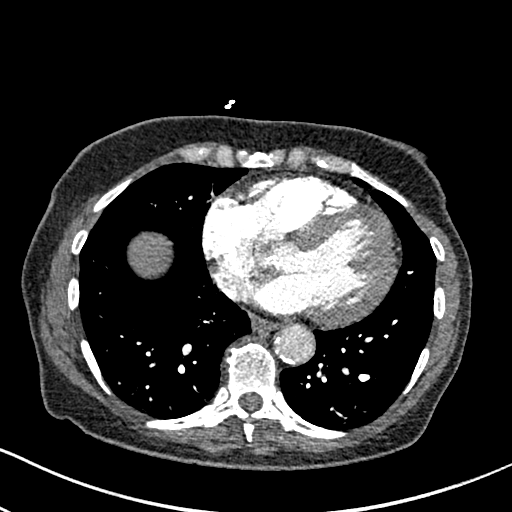
[im 109/266  lung]
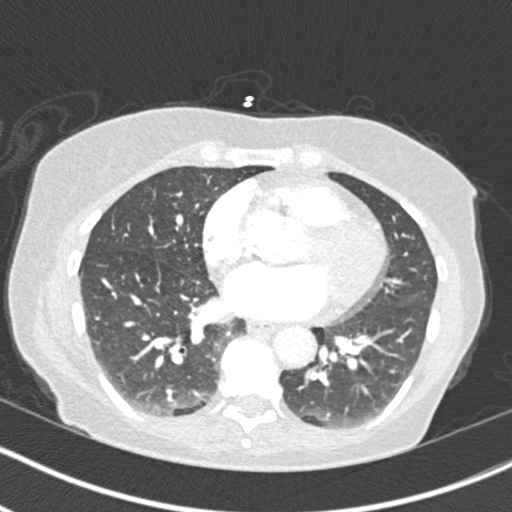
[im 121/266  soft-tissue]
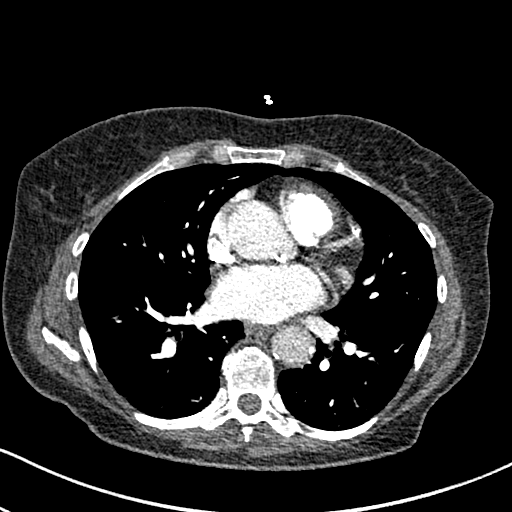
[im 145/266  lung]
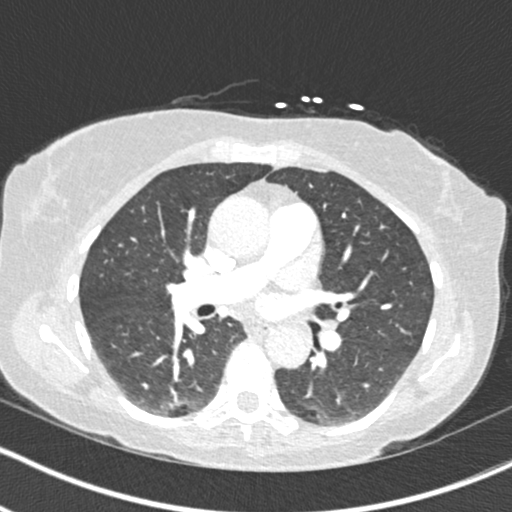
[im 157/266  soft-tissue]
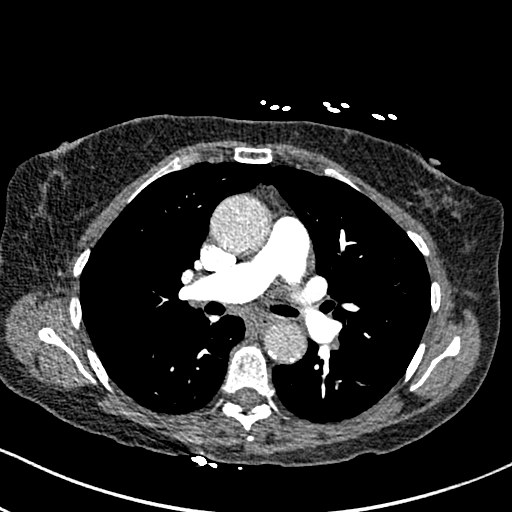
[im 169/266  lung]
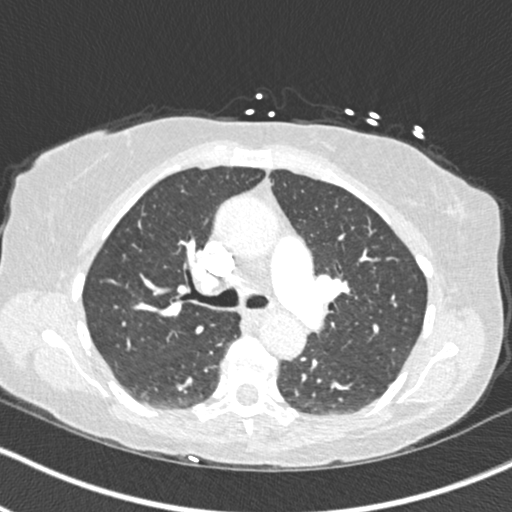
[im 193/266  soft-tissue]
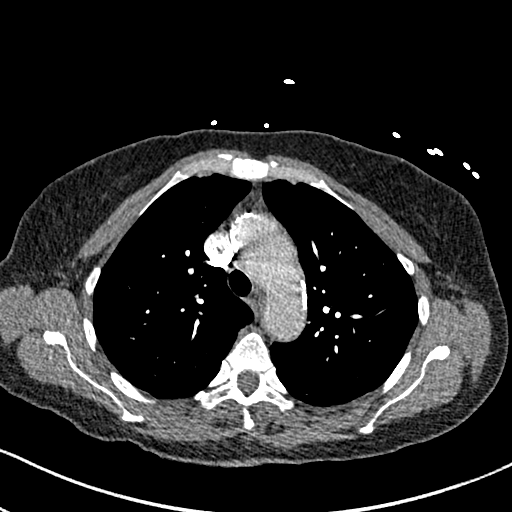
[im 205/266  lung]
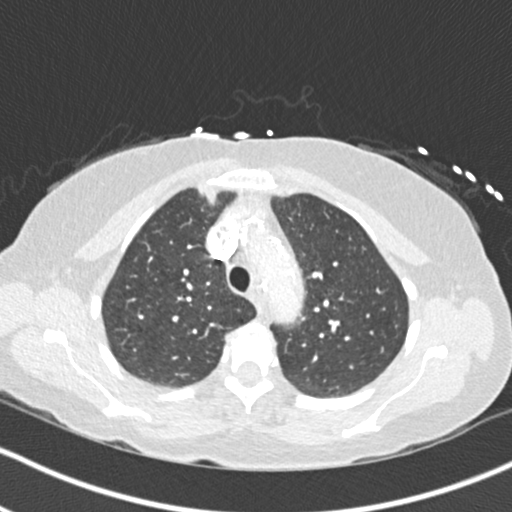
[im 217/266  soft-tissue]
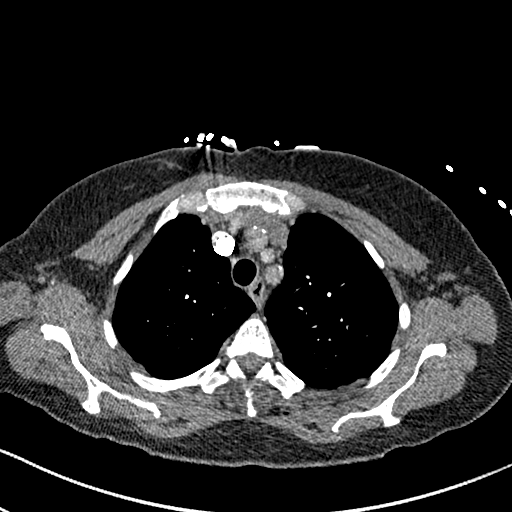
[im 241/266  lung]
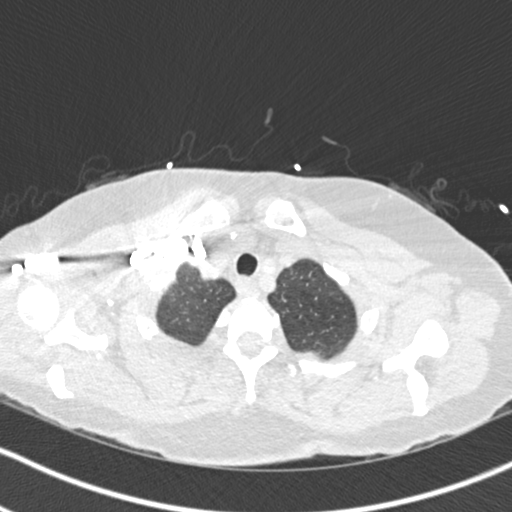
[im 253/266  soft-tissue]
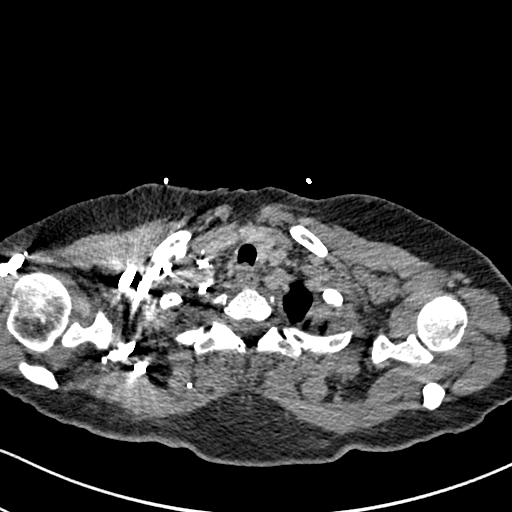

[Series 8: coronal mpr · coronal · 0.54mm/px · 3 of 124 slices shown]
[im 31/124  soft-tissue]
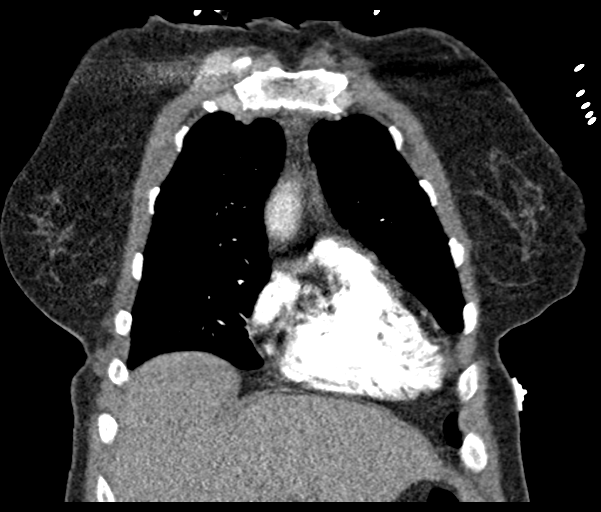
[im 62/124  soft-tissue]
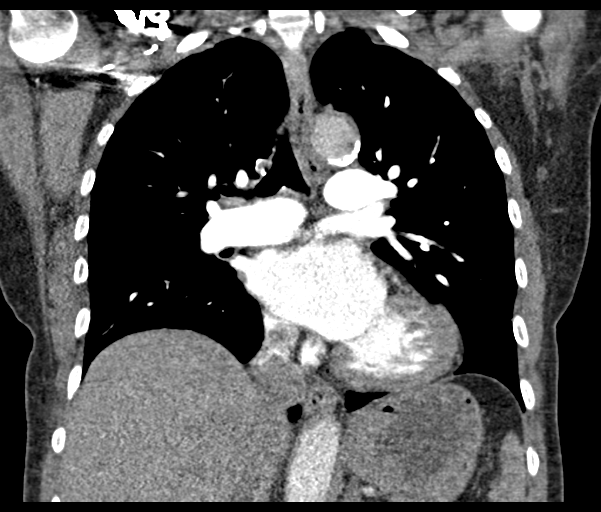
[im 93/124  soft-tissue]
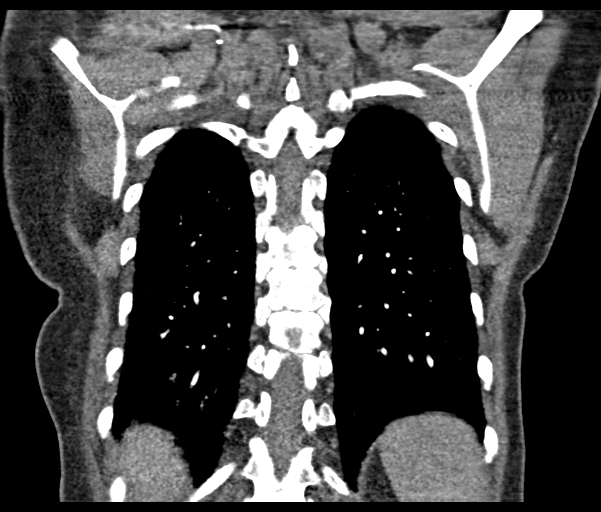

[19 of 46 positions shown; findings below may reference images not displayed]

FINDINGS: Cardiovascular: Atheromatous arterial calcifications, including the
aorta and coronary arteries. Normal sized heart. Normally opacified
pulmonary arteries with no pulmonary arterial filling defects seen.

Mediastinum/Nodes: No enlarged mediastinal, hilar, or axillary lymph
nodes. Thyroid gland, trachea, and esophagus demonstrate no
significant findings.

Lungs/Pleura: Mild hyperexpansion of the lungs with mildly prominent
interstitial markings and bilateral lower lobe subpleural bullous
changes. No lung nodules or pleural fluid.

Upper Abdomen: Unremarkable.

Musculoskeletal: Thoracic spine degenerative changes.

Review of the MIP images confirms the above findings.
IMPRESSION: 1. No pulmonary emboli or acute abnormality.
2. Mild changes of COPD.

## 2018-09-21 ENCOUNTER — Ambulatory Visit (INDEPENDENT_AMBULATORY_CARE_PROVIDER_SITE_OTHER): Payer: Medicare Other | Admitting: *Deleted

## 2018-09-21 DIAGNOSIS — I639 Cerebral infarction, unspecified: Secondary | ICD-10-CM

## 2018-09-22 LAB — CUP PACEART REMOTE DEVICE CHECK
Date Time Interrogation Session: 20200526183845
Implantable Pulse Generator Implant Date: 20181120

## 2018-09-30 ENCOUNTER — Other Ambulatory Visit: Payer: Self-pay | Admitting: Adult Health

## 2018-10-01 NOTE — Progress Notes (Signed)
Carelink Summary Report / Loop Recorder 

## 2018-10-07 ENCOUNTER — Other Ambulatory Visit: Payer: Self-pay

## 2018-10-07 ENCOUNTER — Encounter: Payer: Self-pay | Admitting: Adult Health

## 2018-10-07 ENCOUNTER — Ambulatory Visit (INDEPENDENT_AMBULATORY_CARE_PROVIDER_SITE_OTHER): Payer: Medicare Other | Admitting: Adult Health

## 2018-10-07 DIAGNOSIS — S61259A Open bite of unspecified finger without damage to nail, initial encounter: Secondary | ICD-10-CM | POA: Diagnosis not present

## 2018-10-07 MED ORDER — AMOXICILLIN-POT CLAVULANATE 875-125 MG PO TABS: 1.0000 | ORAL_TABLET | Freq: Two times a day (BID) | ORAL | 0 refills | Status: DC

## 2018-10-07 NOTE — Progress Notes (Signed)
Virtual Visit via Telephone Note  I connected with Heidi Foster on 10/07/18 at 10:30 AM EDT by telephone and verified that I am speaking with the correct person using two identifiers.  Location: Patient: Home Provider: In Clinic    I discussed the limitations, risks, security and privacy concerns of performing an evaluation and management service by telephone and the availability of in person appointments. I also discussed with the patient that there may be a patient responsible charge related to this service. The patient expressed understanding and agreed to proceed.   History of Present Illness: Heidi Foster calls in today with complaint of animal bite that occurred Monday 10/04/2018- stray cat that has lived under their house since birth.  She believes that the cat has never had any immunizations. She denies raccoons living underneath house  She reports tenderness, warmth, swelling, and mild redness at bite site- R 2nd finger. She is R hand dominant. She reports two puncture wounds R 2nd finger between- PIP and DIP. She has been cleaning site with warm/soapy water, peroxide, and applying OTC triple ABX ointment. She has been keeping site covered with band aid. She denies drainage. She denies fever/night sweats/N/V/D/malaise/poor appetite She believes that her tetanus vaccination is UTD Verified last updated 06/25/2016, given in our clinic Review of Systems: General:   Denies fever, chills, unexplained weight loss.  Optho/Auditory:   Denies visual changes, blurred vision/LOV Respiratory:   Denies SOB, DOE more than baseline levels.  Cardiovascular:   Denies chest pain, palpitations, new onset peripheral edema  Gastrointestinal:   Denies nausea, vomiting, diarrhea.  Genitourinary: Denies dysuria, freq/ urgency, flank pain or discharge from genitals.  Endocrine:     Denies hot or cold intolerance, polyuria, polydipsia. Musculoskeletal:   Denies unexplained myalgias, joint swelling, unexplained  arthralgias, gait problems.  Skin:  Denies rash, suspicious lesions, Wound+: R 2nd finger puncture wound Neurological:     Denies dizziness, unexplained weakness, numbness  Psychiatric/Behavioral:   Denies mood changes, suicidal or homicidal ideations, hallucinations COVID-19 Education: Signs and symptoms of COVID-19 infection were discussed with pt and how to seek care for testing.  The importance of following the Stay at Home order, and when out- Social Distancing and wearing a facial mask were discussed today.   Observations/Objective: No acute distress noted during the telephone conversation.  Assessment and Plan: Continue to keep site clean, dry, covered until wound is closed Augmentin 875mg  BID, 10 days Tetanus Vaccination verified, last given 06/25/2016 Continue to social distance and wear a mask when out in public.  Follow Up Instructions: 59month OV with fasting labs   I discussed the assessment and treatment plan with the patient. The patient was provided an opportunity to ask questions and all were answered. The patient agreed with the plan and demonstrated an understanding of the instructions.   The patient was advised to call back or seek an in-person evaluation if the symptoms worsen or if the condition fails to improve as anticipated.  I provided 15 minutes of non-face-to-face time during this encounter.   Esaw Grandchild, NP

## 2018-10-07 NOTE — Assessment & Plan Note (Signed)
Assessment and Plan: Continue to keep site clean, dry, covered until wound is closed Augmentin 875mg  BID, 10 days Tetanus Vaccination verified, last given 06/25/2016 Continue to social distance and wear a mask when out in public.  Follow Up Instructions: 59month OV with fasting labs   I discussed the assessment and treatment plan with the patient. The patient was provided an opportunity to ask questions and all were answered. The patient agreed with the plan and demonstrated an understanding of the instructions.   The patient was advised to call back or seek an in-person evaluation if the symptoms worsen or if the condition fails to improve as anticipated.

## 2018-10-20 ENCOUNTER — Telehealth: Payer: Self-pay | Admitting: Adult Health

## 2018-10-20 MED ORDER — LOSARTAN POTASSIUM 100 MG PO TABS: 100.0000 mg | ORAL_TABLET | Freq: Every day | ORAL | 0 refills | Status: DC

## 2018-10-20 NOTE — Telephone Encounter (Signed)
Patient called request refill order on : losartan (COZAAR) 100 MG tablet [[id number]]   Order Details Dose: 100 mg Route: Oral Frequency: Daily  Dispense Quantity: 90 tablet Refills: 0 Fills remaining: --        Sig: Take 1 tablet (100 mg total) by mouth daily.          ----Forwarding request to medical assistant to send refill order to :  [place], [place] [phone] (Phone) [phone] (Fax)   --glh

## 2018-10-25 ENCOUNTER — Encounter: Payer: Medicare Other | Admitting: *Deleted

## 2018-11-08 ENCOUNTER — Telehealth: Payer: Self-pay | Admitting: Adult Health

## 2018-11-08 ENCOUNTER — Telehealth: Payer: Self-pay | Admitting: Cardiovascular Disease

## 2018-11-08 NOTE — Telephone Encounter (Signed)
Patient called to request refill on :   atorvastatin (LIPITOR) 40 MG tablet [[id number]]   Order Details Dose: 40 mg Route: Oral Frequency: Daily-1800  Dispense Quantity: 30 tablet Refills: 0 Fills remaining: --        Sig: Take 1 tablet (40 mg total) by mouth daily at 6 PM.          --Forwarding request to medical assistant to send refill for  ( 90day supply to :    [place], [place] [phone] (Phone) [phone] (Fax)   --glh

## 2018-11-08 NOTE — Telephone Encounter (Signed)
LMTCB to schedule appt with Dr. Gwenlyn Found for annual f/u visit. There is an opening on 7/17 at 4:15 PM.

## 2018-11-09 NOTE — Telephone Encounter (Signed)
LVM for pt to call to discuss.  T. Nelson, CMA  

## 2018-11-10 MED ORDER — ATORVASTATIN CALCIUM 40 MG PO TABS: 40.0000 mg | ORAL_TABLET | Freq: Every day | ORAL | 0 refills | Status: DC

## 2018-11-10 NOTE — Addendum Note (Signed)
Addended by: Fonnie Mu on: 11/10/2018 05:02 PM   Modules accepted: Orders

## 2018-11-10 NOTE — Telephone Encounter (Signed)
Pt states that she has not been taking this medication consistently, but that she is trying to get back on a routine regimen.  RX sent to [facility] per pt request.  Charyl Bigger, CMA

## 2018-12-08 ENCOUNTER — Other Ambulatory Visit: Payer: Self-pay | Admitting: Adult Health

## 2018-12-21 ENCOUNTER — Telehealth: Payer: Self-pay | Admitting: Adult Health

## 2018-12-21 NOTE — Telephone Encounter (Signed)
Patient is requesting a refill of her losartan, if approved please send to [facility].

## 2018-12-21 NOTE — Telephone Encounter (Signed)
Pt informed that it is too early for refill and we will address refill at her OV on 01/06/2019.  Charyl Bigger, CMA

## 2018-12-24 ENCOUNTER — Other Ambulatory Visit: Payer: Self-pay | Admitting: Adult Health

## 2018-12-29 ENCOUNTER — Other Ambulatory Visit: Payer: Self-pay | Admitting: Adult Health

## 2018-12-30 ENCOUNTER — Other Ambulatory Visit: Payer: Self-pay

## 2018-12-30 ENCOUNTER — Other Ambulatory Visit: Payer: Medicare Other

## 2018-12-30 DIAGNOSIS — R7989 Other specified abnormal findings of blood chemistry: Secondary | ICD-10-CM

## 2018-12-30 DIAGNOSIS — Z0000 Encounter for general adult medical examination without abnormal findings: Secondary | ICD-10-CM

## 2018-12-30 DIAGNOSIS — E7849 Other hyperlipidemia: Secondary | ICD-10-CM

## 2018-12-30 DIAGNOSIS — I10 Essential (primary) hypertension: Secondary | ICD-10-CM

## 2018-12-31 LAB — CBC WITH DIFFERENTIAL/PLATELET
Basophils Absolute: 0 10*3/uL (ref 0.0–0.2)
Basos: 0 %
EOS (ABSOLUTE): 0.2 10*3/uL (ref 0.0–0.4)
Eos: 2 %
Hematocrit: 42.2 % (ref 34.0–46.6)
Hemoglobin: 14.1 g/dL (ref 11.1–15.9)
Immature Grans (Abs): 0 10*3/uL (ref 0.0–0.1)
Immature Granulocytes: 0 %
Lymphocytes Absolute: 2.3 10*3/uL (ref 0.7–3.1)
Lymphs: 26 %
MCH: 30.6 pg (ref 26.6–33.0)
MCHC: 33.4 g/dL (ref 31.5–35.7)
MCV: 92 fL (ref 79–97)
Monocytes Absolute: 0.6 10*3/uL (ref 0.1–0.9)
Monocytes: 7 %
Neutrophils Absolute: 5.6 10*3/uL (ref 1.4–7.0)
Neutrophils: 65 %
Platelets: 246 10*3/uL (ref 150–450)
RBC: 4.61 x10E6/uL (ref 3.77–5.28)
RDW: 13.2 % (ref 11.7–15.4)
WBC: 8.6 10*3/uL (ref 3.4–10.8)

## 2018-12-31 LAB — LIPID PANEL
Chol/HDL Ratio: 3.4 ratio (ref 0.0–4.4)
Cholesterol, Total: 155 mg/dL (ref 100–199)
HDL: 45 mg/dL (ref >39)
LDL Chol Calc (NIH): 86 mg/dL (ref 0–99)
Triglycerides: 133 mg/dL (ref 0–149)
VLDL Cholesterol Cal: 24 mg/dL (ref 5–40)

## 2018-12-31 LAB — COMPREHENSIVE METABOLIC PANEL
ALT: 15 IU/L (ref 0–32)
AST: 19 IU/L (ref 0–40)
Albumin/Globulin Ratio: 2.2 (ref 1.2–2.2)
Albumin: 4.1 g/dL (ref 3.8–4.8)
Alkaline Phosphatase: 109 IU/L (ref 39–117)
BUN/Creatinine Ratio: 25 (ref 12–28)
BUN: 16 mg/dL (ref 8–27)
Bilirubin Total: 0.2 mg/dL (ref 0.0–1.2)
CO2: 25 mmol/L (ref 20–29)
Calcium: 9.4 mg/dL (ref 8.7–10.3)
Chloride: 103 mmol/L (ref 96–106)
Creatinine, Ser: 0.65 mg/dL (ref 0.57–1.00)
GFR calc Af Amer: 110 mL/min/{1.73_m2} (ref >59)
GFR calc non Af Amer: 95 mL/min/{1.73_m2} (ref >59)
Globulin, Total: 1.9 g/dL (ref 1.5–4.5)
Glucose: 82 mg/dL (ref 65–99)
Potassium: 4.4 mmol/L (ref 3.5–5.2)
Sodium: 141 mmol/L (ref 134–144)
Total Protein: 6 g/dL (ref 6.0–8.5)

## 2018-12-31 LAB — HEMOGLOBIN A1C
Est. average glucose Bld gHb Est-mCnc: 114 mg/dL
Hgb A1c MFr Bld: 5.6 % (ref 4.8–5.6)

## 2018-12-31 LAB — TSH: TSH: 1.57 u[IU]/mL (ref 0.450–4.500)

## 2019-01-05 NOTE — Progress Notes (Signed)
Subjective:    Patient ID: Heidi Foster, female    DOB: 1956-02-27, 63 y.o.   MRN: [id number]  HPI:  Heidi Foster is here for CPE She reports medication compliance, denies SE Ambulatory BP readings: SBP 120-130s DBP 60-70s She denies dizziness with position changes She continues to abstain from tobacco/vape/ETOH use She has been walking daily and going to the community pool frequently in summer She estimates to drink >50 oz water/day and has been trying to keep fast food to a min She reports improved home life- her son and daughter-in-law do not have access to her monthly social security funds. She is home schooling her grand-son, he will turn 6 this fall, entering kindergarten this year. She denies acute complaints today  12/30/2018 Labs: TSH-WNL, 1.570  Lipid Panel-  Tot-155  TGs-133  HDL-45  LDL-86  A1c-5.6  CMP-stable  CBC-stable   Healthcare Maintenance: PAP-UTD, 01/14/17-normal, she will update next year with OB/GYN Mammogram-ordered Colonoscopy-declined, ordered Cologaurd LDCT-Recently stopped smoking.  Pack hx is between 20-30 years- will try to get insurance to cover Immunizations-Zoster updated today  Patient Care Team    Relationship Specialty Notifications Start End  Esaw Grandchild, NP PCP - General Family Medicine  05/26/16     Patient Active Problem List   Diagnosis Date Noted  . Animal bite of finger 10/07/2018  . Acute pain of right knee 11/09/2017  . Knee swelling 11/09/2017  . Restless leg syndrome 07/28/2017  . Low serum creatine 06/30/2017  . Acute bacterial conjunctivitis of left eye 06/30/2017  . Insomnia 04/01/2017  . Cerebral aneurysm   . Stroke ([place]) 03/15/2017  . Paresthesia of both hands 03/09/2017  . Hx of transient ischemic attack (TIA) 03/09/2017  . Fibromyalgia 01/22/2017  . Breast pain, left 12/15/2016  . Generalized weakness 12/15/2016  . Leg pain, bilateral 12/15/2016  . 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 Hoarseness 10/08/2016  .  Laryngopharyngeal reflux (LPR) 10/08/2016  . Centrilobular emphysema ([place]) 09/09/2016  . GERD (gastroesophageal reflux disease) 06/30/2016  . Dysphagia 06/30/2016  . Health care maintenance 06/25/2016  . Other fatigue 05/26/2016  . Hypertension 05/26/2016  . Other hyperlipidemia 05/26/2016  . Family history of diabetes mellitus in mother 05/26/2016  . Acute pain of right shoulder 05/26/2016  . Tobacco use disorder 05/26/2016  . Dyspnea on exertion 05/26/2016  . Chronic pain of right knee 05/26/2016  . History of MI (myocardial infarction) 05/26/2016  . History of TIA (transient ischemic attack) 05/26/2016  . Numbness and tingling of left lower extremity 05/26/2016  . Numbness and tingling in left hand 05/26/2016  . 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 Allergy   . Anginal pain ([place])   . Arthritis   . 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 Closed fracture of right patella   . Emphysema of lung ([place])   . GERD (gastroesophageal reflux disease)   . Heart attack ([place]) 1999  . Hypertension   . Osteopenia 12/2016   T score -1.2 FRAX 7.4%/0.8%  . Seborrheic keratoses   . Stroke ([place])    TIA x 2, early 2000's   . Vocal cord polyp      Past Surgical History:  Procedure Laterality Date  . BREAST BIOPSY    . BREAST SURGERY     Left Breast nodule markers  . CARDIAC CATHETERIZATION     1999  . caridac cath    . LOOP RECORDER INSERTION  N/A 03/17/2017   Procedure: LOOP RECORDER INSERTION;  Surgeon: Evans Lance, MD;  Location: [facility];  Service: Cardiovascular;  Laterality: N/A;  . lt. ovary removed    . MICROLARYNGOSCOPY WITH LASER Bilateral 11/13/2016   Procedure: MICROLARYNGOSCOPY;  Surgeon: Melissa Montane, MD;  Location: [facility];  Service: ENT;  Laterality: Bilateral;  . polyp removal  10/2016   throat -   . SKIN BIOPSY     nose  . TEE WITHOUT CARDIOVERSION N/A 03/17/2017   Procedure: TRANSESOPHAGEAL ECHOCARDIOGRAM (TEE);  Surgeon:  Dorothy Spark, MD;  Location: [facility] ENDOSCOPY;  Service: Cardiovascular;  Laterality: N/A;  . TUBAL LIGATION       Family History  Problem Relation Age of Onset  . Diabetes Mother   . Hypertension Mother   . Stroke Mother   . Alcohol abuse Father   . Multiple sclerosis Sister   . Cancer Brother        lung  . Stroke Maternal Grandmother   . Heart attack Maternal Grandmother   . Hypertension Maternal Grandmother   . Colon cancer Neg Hx   . Colon polyps Neg Hx   . Esophageal cancer Neg Hx   . Stomach cancer Neg Hx   . Rectal cancer Neg Hx      Social History   Substance and Sexual Activity  Drug Use No     Social History   Substance and Sexual Activity  Alcohol Use No     Social History   Tobacco Use  Smoking Status Current Every Day Smoker  . Packs/day: 0.50  . Years: 40.00  . Pack years: 20.00  . Types: Cigarettes  . Start date: 09/27/1976  Smokeless Tobacco Never Used  Tobacco Comment   Peak rate of 1ppd     Outpatient Encounter Medications as of 01/06/2019  Medication Sig  . albuterol (PROVENTIL HFA;VENTOLIN HFA) 108 (90 Base) MCG/ACT inhaler Inhale 2 puffs into the lungs every 4 (four) hours as needed for wheezing or shortness of breath.  Marland Kitchen atorvastatin (LIPITOR) 40 MG tablet TAKE 1 TABLET BY MOUTH  DAILY AT 6 PM  . clopidogrel (PLAVIX) 75 MG tablet Take 1 tablet (75 mg total) by mouth daily.  . DULoxetine (CYMBALTA) 30 MG capsule Take two capsules daily  . gabapentin (NEURONTIN) 300 MG capsule 1 cap at bedtime  . losartan (COZAAR) 100 MG tablet Take 1 tablet (100 mg total) by mouth daily.  . Multiple Vitamin (MULTIVITAMIN WITH MINERALS) TABS tablet Take 1 tablet daily by mouth.  . Spacer/Aero-Holding Chambers (AEROCHAMBER MV) inhaler Use as instructed  . umeclidinium-vilanterol (ANORO ELLIPTA) 62.5-25 MCG/INH AEPB Inhale 1 puff daily into the lungs.  . verapamil (CALAN-SR) 180 MG CR tablet TAKE 1 TABLET BY MOUTH  DAILY  . [DISCONTINUED] DULoxetine  (CYMBALTA) 30 MG capsule Take two capsules daily  . [DISCONTINUED] losartan (COZAAR) 100 MG tablet Take 1 tablet (100 mg total) by mouth daily.  Marland Kitchen Zoster Vaccine Adjuvanted [facility]) injection Inject 0.5 mLs into the muscle once for 1 dose.  . [DISCONTINUED] amoxicillin-clavulanate (AUGMENTIN) 875-125 MG tablet Take 1 tablet by mouth 2 (two) times daily.   No facility-administered encounter medications on file as of 01/06/2019.     Allergies: Codeine and Bupropion  Body mass index is 30.57 kg/m.  Blood pressure 112/68, pulse 77, temperature 98.7 F (37.1 C), temperature source Oral, height 5\' 1"  (1.549 m), weight 161 lb 12.8 oz (73.4 kg), SpO2 96 %.   Review of Systems  Constitutional: Positive  for fatigue. Negative for activity change, appetite change, chills, diaphoresis, fever and unexpected weight change.  HENT: Negative for congestion.   Eyes: Negative for visual disturbance.  Respiratory: Negative for cough, chest tightness, shortness of breath, wheezing and stridor.   Cardiovascular: Negative for chest pain, palpitations and leg swelling.  Gastrointestinal: Negative for abdominal distention, anal bleeding, blood in stool, constipation, nausea and vomiting.  Endocrine: Negative for cold intolerance, heat intolerance, polydipsia, polyphagia and polyuria.  Genitourinary: Negative for difficulty urinating and flank pain.  Musculoskeletal: Positive for arthralgias, back pain, gait problem, joint swelling, myalgias, neck pain and neck stiffness.  Skin: Negative for color change, pallor, rash and wound.  Neurological: Negative for dizziness and headaches.  Hematological: Negative for adenopathy. Does not bruise/bleed easily.  Psychiatric/Behavioral: Negative for agitation, behavioral problems, confusion, decreased concentration, dysphoric mood, hallucinations, self-injury, sleep disturbance and suicidal ideas. The patient is not nervous/anxious and is not hyperactive.         Objective:   Physical Exam Vitals signs and nursing note reviewed.  Constitutional:      General: She is not in acute distress.    Appearance: Normal appearance. She is obese. She is not ill-appearing, toxic-appearing or diaphoretic.  HENT:     Head: Normocephalic and atraumatic.     Right Ear: Tympanic membrane, ear canal and external ear normal. There is no impacted cerumen.     Left Ear: Tympanic membrane, ear canal and external ear normal. There is no impacted cerumen.     Nose: Nose normal. No congestion.     Mouth/Throat:     Mouth: Mucous membranes are moist.     Pharynx: No oropharyngeal exudate.  Eyes:     Extraocular Movements: Extraocular movements intact.     Conjunctiva/sclera: Conjunctivae normal.     Pupils: Pupils are equal, round, and reactive to light.  Neck:     Musculoskeletal: Normal range of motion and neck supple.  Cardiovascular:     Rate and Rhythm: Normal rate and regular rhythm.     Pulses: Normal pulses.     Heart sounds: Normal heart sounds. No murmur. No friction rub. No gallop.   Pulmonary:     Effort: Pulmonary effort is normal. No respiratory distress.     Breath sounds: Normal breath sounds. No stridor. No wheezing, rhonchi or rales.  Chest:     Chest wall: No tenderness.  Abdominal:     General: Abdomen is protuberant. Bowel sounds are normal.     Palpations: Abdomen is soft.     Tenderness: There is no abdominal tenderness. There is no right CVA tenderness, left CVA tenderness, guarding or rebound. Negative signs include Murphy's sign and Rovsing's sign.  Musculoskeletal: Normal range of motion.        General: No swelling or tenderness.     Comments: Kyphosis +  Skin:    General: Skin is warm and dry.     Capillary Refill: Capillary refill takes less than 2 seconds.     Findings: No erythema.  Neurological:     Mental Status: She is alert and oriented to person, place, and time.     Coordination: Coordination normal.  Psychiatric:         Mood and Affect: Mood normal.        Behavior: Behavior normal.        Thought Content: Thought content normal.        Judgment: Judgment normal.       Assessment & Plan:  1. Screening for colon cancer   2. Screening for breast cancer   3. Need for zoster vaccination   4. Need for influenza vaccination   5. Health care maintenance   6. Hypertension, unspecified type   7. Restless leg syndrome   8. Tobacco use disorder   9. Other hyperlipidemia   10. Fibromyalgia     Health care maintenance Blood pressure, weight, labs- stable. Remain well hydrated and follow heart healthy diet. Continue all medications as directed. Remain as active as possible. Please check blood pressure, heart rate several times/week-please call clinic if consistently >140/90 or <100/60. Continue to social distance and wear a mask when in public. Follow-up 3 months.  Hypertension BP at goal- 112/68, HR 77 Currently in Losartan 100mg , Verapamil 180mg  QD Remains tobacco free   Restless leg syndrome Well controlled- Gabapentin 300mg  QHS  Tobacco use disorder Remains tobacco free! LDCT-Recently stopped smoking.  Pack hx is between 20-30 years- will try to get insurance to cover  Other hyperlipidemia 12/30/2018  Lipid Panel-  Tot-155  TGs-133  HDL-45  LDL-86  A1c-5.6  Currently on Atorvastatin 40mg  QD  Fibromyalgia Cymbalta 60mg  QD    FOLLOW-UP:  Return in about 3 months (around 04/07/2019) for Regular Follow Up, HTN, Hypercholestermia.

## 2019-01-06 ENCOUNTER — Other Ambulatory Visit: Payer: Self-pay

## 2019-01-06 ENCOUNTER — Other Ambulatory Visit: Payer: Medicare Other

## 2019-01-06 ENCOUNTER — Ambulatory Visit (INDEPENDENT_AMBULATORY_CARE_PROVIDER_SITE_OTHER): Payer: Medicare Other | Admitting: Adult Health

## 2019-01-06 ENCOUNTER — Encounter: Payer: Self-pay | Admitting: Adult Health

## 2019-01-06 VITALS — BP 112/68 | HR 77 | Temp 98.7°F | Ht 61.0 in | Wt 161.8 lb

## 2019-01-06 DIAGNOSIS — Z23 Encounter for immunization: Secondary | ICD-10-CM

## 2019-01-06 DIAGNOSIS — Z1211 Encounter for screening for malignant neoplasm of colon: Secondary | ICD-10-CM

## 2019-01-06 DIAGNOSIS — Z1239 Encounter for other screening for malignant neoplasm of breast: Secondary | ICD-10-CM | POA: Diagnosis not present

## 2019-01-06 DIAGNOSIS — F17200 Nicotine dependence, unspecified, uncomplicated: Secondary | ICD-10-CM

## 2019-01-06 DIAGNOSIS — Z0000 Encounter for general adult medical examination without abnormal findings: Secondary | ICD-10-CM

## 2019-01-06 DIAGNOSIS — G2581 Restless legs syndrome: Secondary | ICD-10-CM

## 2019-01-06 DIAGNOSIS — I10 Essential (primary) hypertension: Secondary | ICD-10-CM

## 2019-01-06 DIAGNOSIS — M797 Fibromyalgia: Secondary | ICD-10-CM

## 2019-01-06 DIAGNOSIS — E7849 Other hyperlipidemia: Secondary | ICD-10-CM

## 2019-01-06 MED ORDER — DULOXETINE HCL 30 MG PO CPEP: ORAL_CAPSULE | ORAL | 2 refills | Status: DC

## 2019-01-06 MED ORDER — SHINGRIX 50 MCG/0.5ML IM SUSR: 0.5000 mL | Freq: Once | INTRAMUSCULAR | 0 refills | Status: AC

## 2019-01-06 MED ORDER — LOSARTAN POTASSIUM 100 MG PO TABS: 100.0000 mg | ORAL_TABLET | Freq: Every day | ORAL | 1 refills | Status: DC

## 2019-01-06 NOTE — Assessment & Plan Note (Signed)
Well controlled- Gabapentin 300mg  QHS

## 2019-01-06 NOTE — Assessment & Plan Note (Signed)
Blood pressure, weight, labs- stable. Remain well hydrated and follow heart healthy diet. Continue all medications as directed. Remain as active as possible. Please check blood pressure, heart rate several times/week-please call clinic if consistently >140/90 or <100/60. Continue to social distance and wear a mask when in public. Follow-up 3 months.

## 2019-01-06 NOTE — Assessment & Plan Note (Signed)
Remains tobacco free! LDCT-Recently stopped smoking.  Pack hx is between 20-30 years- will try to get insurance to cover

## 2019-01-06 NOTE — Assessment & Plan Note (Signed)
BP at goal- 112/68, HR 77 Currently in Losartan 100mg , Verapamil 180mg  QD Remains tobacco free

## 2019-01-06 NOTE — Patient Instructions (Addendum)
Preventive Care for Adults, Female  A healthy lifestyle and preventive care can promote health and wellness. Preventive health guidelines for women include the following key practices.   A routine yearly physical is a good way to check with your health care provider about your health and preventive screening. It is a chance to share any concerns and updates on your health and to receive a thorough exam.   Visit your dentist for a routine exam and preventive care every 6 months. Brush your teeth twice a day and floss once a day. Good oral hygiene prevents tooth decay and gum disease.   The frequency of eye exams is based on your age, health, family medical history, use of contact lenses, and other factors. Follow your health care provider's recommendations for frequency of eye exams.   Eat a healthy diet. Foods like vegetables, fruits, whole grains, low-fat dairy products, and lean protein foods contain the nutrients you need without too many calories. Decrease your intake of foods high in solid fats, added sugars, and salt. Eat the right amount of calories for you.Get information about a proper diet from your health care provider, if necessary.   Regular physical exercise is one of the most important things you can do for your health. Most adults should get at least 150 minutes of moderate-intensity exercise (any activity that increases your heart rate and causes you to sweat) each week. In addition, most adults need muscle-strengthening exercises on 2 or more days a week.   Maintain a healthy weight. The body mass index (BMI) is a screening tool to identify possible weight problems. It provides an estimate of body fat based on height and weight. Your health care provider can find your BMI, and can help you achieve or maintain a healthy weight.For adults 20 years and older:   - A BMI below 18.5 is considered underweight.   - A BMI of 18.5 to 24.9 is normal.   - A BMI of 25 to 29.9 is  considered overweight.   - A BMI of 30 and above is considered obese.   Maintain normal blood lipids and cholesterol levels by exercising and minimizing your intake of trans and saturated fats.  Eat a balanced diet with plenty of fruit and vegetables. Blood tests for lipids and cholesterol should begin at age 20 and be repeated every 5 years minimum.  If your lipid or cholesterol levels are high, you are over 40, or you are at high risk for heart disease, you may need your cholesterol levels checked more frequently.Ongoing high lipid and cholesterol levels should be treated with medicines if diet and exercise are not working.   If you smoke, find out from your health care provider how to quit. If you do not use tobacco, do not start.   Lung cancer screening is recommended for adults aged 55-80 years who are at high risk for developing lung cancer because of a history of smoking. A yearly low-dose CT scan of the lungs is recommended for people who have at least a 30-pack-year history of smoking and are a current smoker or have quit within the past 15 years. A pack year of smoking is smoking an average of 1 pack of cigarettes a day for 1 year (for example: 1 pack a day for 30 years or 2 packs a day for 15 years). Yearly screening should continue until the smoker has stopped smoking for at least 15 years. Yearly screening should be stopped for people who develop a   health problem that would prevent them from having lung cancer treatment.   If you are pregnant, do not drink alcohol. If you are breastfeeding, be very cautious about drinking alcohol. If you are not pregnant and choose to drink alcohol, do not have more than 1 drink per day. One drink is considered to be 12 ounces (355 mL) of beer, 5 ounces (148 mL) of wine, or 1.5 ounces (44 mL) of liquor.   Avoid use of street drugs. Do not share needles with anyone. Ask for help if you need support or instructions about stopping the use of  drugs.   High blood pressure causes heart disease and increases the risk of stroke. Your blood pressure should be checked at least yearly.  Ongoing high blood pressure should be treated with medicines if weight loss and exercise do not work.   If you are 69-55 years old, ask your health care provider if you should take aspirin to prevent strokes.   Diabetes screening involves taking a blood sample to check your fasting blood sugar level. This should be done once every 3 years, after age 38, if you are within normal weight and without risk factors for diabetes. Testing should be considered at a younger age or be carried out more frequently if you are overweight and have at least 1 risk factor for diabetes.   Breast cancer screening is essential preventive care for women. You should practice "breast self-awareness."  This means understanding the normal appearance and feel of your breasts and may include breast self-examination.  Any changes detected, no matter how small, should be reported to a health care provider.  Women in their 80s and 30s should have a clinical breast exam (CBE) by a health care provider as part of a regular health exam every 1 to 3 years.  After age 66, women should have a CBE every year.  Starting at age 1, women should consider having a mammogram (breast X-ray test) every year.  Women who have a family history of breast cancer should talk to their health care provider about genetic screening.  Women at a high risk of breast cancer should talk to their health care providers about having an MRI and a mammogram every year.   -Breast cancer gene (BRCA)-related cancer risk assessment is recommended for women who have family members with BRCA-related cancers. BRCA-related cancers include breast, ovarian, tubal, and peritoneal cancers. Having family members with these cancers may be associated with an increased risk for harmful changes (mutations) in the breast cancer genes BRCA1 and  BRCA2. Results of the assessment will determine the need for genetic counseling and BRCA1 and BRCA2 testing.   The Pap test is a screening test for cervical cancer. A Pap test can show cell changes on the cervix that might become cervical cancer if left untreated. A Pap test is a procedure in which cells are obtained and examined from the lower end of the uterus (cervix).   - Women should have a Pap test starting at age 57.   - Between ages 90 and 70, Pap tests should be repeated every 2 years.   - Beginning at age 63, you should have a Pap test every 3 years as long as the past 3 Pap tests have been normal.   - Some women have medical problems that increase the chance of getting cervical cancer. Talk to your health care provider about these problems. It is especially important to talk to your health care provider if a  new problem develops soon after your last Pap test. In these cases, your health care provider may recommend more frequent screening and Pap tests.   - The above recommendations are the same for women who have or have not gotten the vaccine for human papillomavirus (HPV).   - If you had a hysterectomy for a problem that was not cancer or a condition that could lead to cancer, then you no longer need Pap tests. Even if you no longer need a Pap test, a regular exam is a good idea to make sure no other problems are starting.   - If you are between ages 36 and 66 years, and you have had normal Pap tests going back 10 years, you no longer need Pap tests. Even if you no longer need a Pap test, a regular exam is a good idea to make sure no other problems are starting.   - If you have had past treatment for cervical cancer or a condition that could lead to cancer, you need Pap tests and screening for cancer for at least 20 years after your treatment.   - If Pap tests have been discontinued, risk factors (such as a new sexual partner) need to be reassessed to determine if screening should  be resumed.   - The HPV test is an additional test that may be used for cervical cancer screening. The HPV test looks for the virus that can cause the cell changes on the cervix. The cells collected during the Pap test can be tested for HPV. The HPV test could be used to screen women aged 70 years and older, and should be used in women of any age who have unclear Pap test results. After the age of 67, women should have HPV testing at the same frequency as a Pap test.   Colorectal cancer can be detected and often prevented. Most routine colorectal cancer screening begins at the age of 57 years and continues through age 26 years. However, your health care provider may recommend screening at an earlier age if you have risk factors for colon cancer. On a yearly basis, your health care provider may provide home test kits to check for hidden blood in the stool.  Use of a small camera at the end of a tube, to directly examine the colon (sigmoidoscopy or colonoscopy), can detect the earliest forms of colorectal cancer. Talk to your health care provider about this at age 23, when routine screening begins. Direct exam of the colon should be repeated every 5 -10 years through age 49 years, unless early forms of pre-cancerous polyps or small growths are found.   People who are at an increased risk for hepatitis B should be screened for this virus. You are considered at high risk for hepatitis B if:  -You were born in a country where hepatitis B occurs often. Talk with your health care provider about which countries are considered high risk.  - Your parents were born in a high-risk country and you have not received a shot to protect against hepatitis B (hepatitis B vaccine).  - You have HIV or AIDS.  - You use needles to inject street drugs.  - You live with, or have sex with, someone who has Hepatitis B.  - You get hemodialysis treatment.  - You take certain medicines for conditions like cancer, organ  transplantation, and autoimmune conditions.   Hepatitis C blood testing is recommended for all people born from [birth year] through 1965 and any individual  with known risks for hepatitis C.   Practice safe sex. Use condoms and avoid high-risk sexual practices to reduce the spread of sexually transmitted infections (STIs). STIs include gonorrhea, chlamydia, syphilis, trichomonas, herpes, HPV, and human immunodeficiency virus (HIV). Herpes, HIV, and HPV are viral illnesses that have no cure. They can result in disability, cancer, and death. Sexually active women aged 25 years and younger should be checked for chlamydia. Older women with new or multiple partners should also be tested for chlamydia. Testing for other STIs is recommended if you are sexually active and at increased risk.   Osteoporosis is a disease in which the bones lose minerals and strength with aging. This can result in serious bone fractures or breaks. The risk of osteoporosis can be identified using a bone density scan. Women ages 65 years and over and women at risk for fractures or osteoporosis should discuss screening with their health care providers. Ask your health care provider whether you should take a calcium supplement or vitamin D to There are also several preventive steps women can take to avoid osteoporosis and resulting fractures or to keep osteoporosis from worsening. -->Recommendations include:  Eat a balanced diet high in fruits, vegetables, calcium, and vitamins.  Get enough calcium. The recommended total intake of is 1,200 mg daily; for best absorption, if taking supplements, divide doses into 250-500 mg doses throughout the day. Of the two types of calcium, calcium carbonate is best absorbed when taken with food but calcium citrate can be taken on an empty stomach.  Get enough vitamin D. NAMS and the National Osteoporosis Foundation recommend at least 1,000 IU per day for women age 50 and over who are at risk of vitamin D  deficiency. Vitamin D deficiency can be caused by inadequate sun exposure (for example, those who live in northern latitudes).  Avoid alcohol and smoking. Heavy alcohol intake (more than 7 drinks per week) increases the risk of falls and hip fracture and women smokers tend to lose bone more rapidly and have lower bone mass than nonsmokers. Stopping smoking is one of the most important changes women can make to improve their health and decrease risk for disease.  Be physically active every day. Weight-bearing exercise (for example, fast walking, hiking, jogging, and weight training) may strengthen bones or slow the rate of bone loss that comes with aging. Balancing and muscle-strengthening exercises can reduce the risk of falling and fracture.  Consider therapeutic medications. Currently, several types of effective drugs are available. Healthcare providers can recommend the type most appropriate for each woman.  Eliminate environmental factors that may contribute to accidents. Falls cause nearly 90% of all osteoporotic fractures, so reducing this risk is an important bone-health strategy. Measures include ample lighting, removing obstructions to walking, using nonskid rugs on floors, and placing mats and/or grab bars in showers.  Be aware of medication side effects. Some common medicines make bones weaker. These include a type of steroid drug called glucocorticoids used for arthritis and asthma, some antiseizure drugs, certain sleeping pills, treatments for endometriosis, and some cancer drugs. An overactive thyroid gland or using too much thyroid hormone for an underactive thyroid can also be a problem. If you are taking these medicines, talk to your doctor about what you can do to help protect your bones.reduce the rate of osteoporosis.    Menopause can be associated with physical symptoms and risks. Hormone replacement therapy is available to decrease symptoms and risks. You should talk to your  health care provider   about whether hormone replacement therapy is right for you.   Use sunscreen. Apply sunscreen liberally and repeatedly throughout the day. You should seek shade when your shadow is shorter than you. Protect yourself by wearing long sleeves, pants, a wide-brimmed hat, and sunglasses year round, whenever you are outdoors.   Once a month, do a whole body skin exam, using a mirror to look at the skin on your back. Tell your health care provider of new moles, moles that have irregular borders, moles that are larger than a pencil eraser, or moles that have changed in shape or color.   -Stay current with required vaccines (immunizations).   Influenza vaccine. All adults should be immunized every year.  Tetanus, diphtheria, and acellular pertussis (Td, Tdap) vaccine. Pregnant women should receive 1 dose of Tdap vaccine during each pregnancy. The dose should be obtained regardless of the length of time since the last dose. Immunization is preferred during the 27th 36th week of gestation. An adult who has not previously received Tdap or who does not know her vaccine status should receive 1 dose of Tdap. This initial dose should be followed by tetanus and diphtheria toxoids (Td) booster doses every 10 years. Adults with an unknown or incomplete history of completing a 3-dose immunization series with Td-containing vaccines should begin or complete a primary immunization series including a Tdap dose. Adults should receive a Td booster every 10 years.  Varicella vaccine. An adult without evidence of immunity to varicella should receive 2 doses or a second dose if she has previously received 1 dose. Pregnant females who do not have evidence of immunity should receive the first dose after pregnancy. This first dose should be obtained before leaving the health care facility. The second dose should be obtained 4 8 weeks after the first dose.  Human papillomavirus (HPV) vaccine. Females aged 13 26  years who have not received the vaccine previously should obtain the 3-dose series. The vaccine is not recommended for use in pregnant females. However, pregnancy testing is not needed before receiving a dose. If a female is found to be pregnant after receiving a dose, no treatment is needed. In that case, the remaining doses should be delayed until after the pregnancy. Immunization is recommended for any person with an immunocompromised condition through the age of 26 years if she did not get any or all doses earlier. During the 3-dose series, the second dose should be obtained 4 8 weeks after the first dose. The third dose should be obtained 24 weeks after the first dose and 16 weeks after the second dose.  Zoster vaccine. One dose is recommended for adults aged 60 years or older unless certain conditions are present.  Measles, mumps, and rubella (MMR) vaccine. Adults born before 1957 generally are considered immune to measles and mumps. Adults born in 1957 or later should have 1 or more doses of MMR vaccine unless there is a contraindication to the vaccine or there is laboratory evidence of immunity to each of the three diseases. A routine second dose of MMR vaccine should be obtained at least 28 days after the first dose for students attending postsecondary schools, health care workers, or international travelers. People who received inactivated measles vaccine or an unknown type of measles vaccine during 1963 1967 should receive 2 doses of MMR vaccine. People who received inactivated mumps vaccine or an unknown type of mumps vaccine before 1979 and are at high risk for mumps infection should consider immunization with 2 doses of   MMR vaccine. For females of childbearing age, rubella immunity should be determined. If there is no evidence of immunity, females who are not pregnant should be vaccinated. If there is no evidence of immunity, females who are pregnant should delay immunization until after pregnancy.  Unvaccinated health care workers born before [birth year] who lack laboratory evidence of measles, mumps, or rubella immunity or laboratory confirmation of disease should consider measles and mumps immunization with 2 doses of MMR vaccine or rubella immunization with 1 dose of MMR vaccine.  Pneumococcal 13-valent conjugate (PCV13) vaccine. When indicated, a person who is uncertain of her immunization history and has no record of immunization should receive the PCV13 vaccine. An adult aged 54 years or older who has certain medical conditions and has not been previously immunized should receive 1 dose of PCV13 vaccine. This PCV13 should be followed with a dose of pneumococcal polysaccharide (PPSV23) vaccine. The PPSV23 vaccine dose should be obtained at least 8 weeks after the dose of PCV13 vaccine. An adult aged 58 years or older who has certain medical conditions and previously received 1 or more doses of PPSV23 vaccine should receive 1 dose of PCV13. The PCV13 vaccine dose should be obtained 1 or more years after the last PPSV23 vaccine dose.  Pneumococcal polysaccharide (PPSV23) vaccine. When PCV13 is also indicated, PCV13 should be obtained first. All adults aged 58 years and older should be immunized. An adult younger than age 65 years who has certain medical conditions should be immunized. Any person who resides in a nursing home or long-term care facility should be immunized. An adult smoker should be immunized. People with an immunocompromised condition and certain other conditions should receive both PCV13 and PPSV23 vaccines. People with human immunodeficiency virus (HIV) infection should be immunized as soon as possible after diagnosis. Immunization during chemotherapy or radiation therapy should be avoided. Routine use of PPSV23 vaccine is not recommended for American Indians, [place] Natives, or people younger than 65 years unless there are medical conditions that require PPSV23 vaccine. When indicated,  people who have unknown immunization and have no record of immunization should receive PPSV23 vaccine. One-time revaccination 5 years after the first dose of PPSV23 is recommended for people aged 70 64 years who have chronic kidney failure, nephrotic syndrome, asplenia, or immunocompromised conditions. People who received 1 2 doses of PPSV23 before age 32 years should receive another dose of PPSV23 vaccine at age 96 years or later if at least 5 years have passed since the previous dose. Doses of PPSV23 are not needed for people immunized with PPSV23 at or after age 55 years.  Meningococcal vaccine. Adults with asplenia or persistent complement component deficiencies should receive 2 doses of quadrivalent meningococcal conjugate (MenACWY-D) vaccine. The doses should be obtained at least 2 months apart. Microbiologists working with certain meningococcal bacteria, [employer] recruits, people at risk during an outbreak, and people who travel to or live in countries with a high rate of meningitis should be immunized. A first-year college student up through age 58 years who is living in a residence hall should receive a dose if she did not receive a dose on or after her 16th birthday. Adults who have certain high-risk conditions should receive one or more doses of vaccine.  Hepatitis A vaccine. Adults who wish to be protected from this disease, have certain high-risk conditions, work with hepatitis A-infected animals, work in hepatitis A research labs, or travel to or work in countries with a high rate of hepatitis A should be  immunized. Adults who were previously unvaccinated and who anticipate close contact with an international adoptee during the first 60 days after arrival in the [place] from a country with a high rate of hepatitis A should be immunized.  Hepatitis B vaccine.  Adults who wish to be protected from this disease, have certain high-risk conditions, may be exposed to blood or other infectious  body fluids, are household contacts or sex partners of hepatitis B positive people, are clients or workers in certain care facilities, or travel to or work in countries with a high rate of hepatitis B should be immunized.  Haemophilus influenzae type b (Hib) vaccine. A previously unvaccinated person with asplenia or sickle cell disease or having a scheduled splenectomy should receive 1 dose of Hib vaccine. Regardless of previous immunization, a recipient of a hematopoietic stem cell transplant should receive a 3-dose series 6 12 months after her successful transplant. Hib vaccine is not recommended for adults with HIV infection.  Preventive Services / Frequency Ages 6 to 39years  Blood pressure check.** / Every 1 to 2 years.  Lipid and cholesterol check.** / Every 5 years beginning at age 39.  Clinical breast exam.** / Every 3 years for women in their 61s and 62s.  BRCA-related cancer risk assessment.** / For women who have family members with a BRCA-related cancer (breast, ovarian, tubal, or peritoneal cancers).  Pap test.** / Every 2 years from ages 47 through 85. Every 3 years starting at age 34 through age 12 or 74 with a history of 3 consecutive normal Pap tests.  HPV screening.** / Every 3 years from ages 46 through ages 43 to 54 with a history of 3 consecutive normal Pap tests.  Hepatitis C blood test.** / For any individual with known risks for hepatitis C.  Skin self-exam. / Monthly.  Influenza vaccine. / Every year.  Tetanus, diphtheria, and acellular pertussis (Tdap, Td) vaccine.** / Consult your health care provider. Pregnant women should receive 1 dose of Tdap vaccine during each pregnancy. 1 dose of Td every 10 years.  Varicella vaccine.** / Consult your health care provider. Pregnant females who do not have evidence of immunity should receive the first dose after pregnancy.  HPV vaccine. / 3 doses over 6 months, if 64 and younger. The vaccine is not recommended for use in  pregnant females. However, pregnancy testing is not needed before receiving a dose.  Measles, mumps, rubella (MMR) vaccine.** / You need at least 1 dose of MMR if you were born in 1957 or later. You may also need a 2nd dose. For females of childbearing age, rubella immunity should be determined. If there is no evidence of immunity, females who are not pregnant should be vaccinated. If there is no evidence of immunity, females who are pregnant should delay immunization until after pregnancy.  Pneumococcal 13-valent conjugate (PCV13) vaccine.** / Consult your health care provider.  Pneumococcal polysaccharide (PPSV23) vaccine.** / 1 to 2 doses if you smoke cigarettes or if you have certain conditions.  Meningococcal vaccine.** / 1 dose if you are age 71 to 37 years and a Market researcher living in a residence hall, or have one of several medical conditions, you need to get vaccinated against meningococcal disease. You may also need additional booster doses.  Hepatitis A vaccine.** / Consult your health care provider.  Hepatitis B vaccine.** / Consult your health care provider.  Haemophilus influenzae type b (Hib) vaccine.** / Consult your health care provider.  Ages 55 to 64years  Blood pressure check.** / Every 1 to 2 years.  Lipid and cholesterol check.** / Every 5 years beginning at age 20 years.  Lung cancer screening. / Every year if you are aged 55 80 years and have a 30-pack-year history of smoking and currently smoke or have quit within the past 15 years. Yearly screening is stopped once you have quit smoking for at least 15 years or develop a health problem that would prevent you from having lung cancer treatment.  Clinical breast exam.** / Every year after age 40 years.  BRCA-related cancer risk assessment.** / For women who have family members with a BRCA-related cancer (breast, ovarian, tubal, or peritoneal cancers).  Mammogram.** / Every year beginning at age 40  years and continuing for as long as you are in good health. Consult with your health care provider.  Pap test.** / Every 3 years starting at age 30 years through age 65 or 70 years with a history of 3 consecutive normal Pap tests.  HPV screening.** / Every 3 years from ages 30 years through ages 65 to 70 years with a history of 3 consecutive normal Pap tests.  Fecal occult blood test (FOBT) of stool. / Every year beginning at age 50 years and continuing until age 75 years. You may not need to do this test if you get a colonoscopy every 10 years.  Flexible sigmoidoscopy or colonoscopy.** / Every 5 years for a flexible sigmoidoscopy or every 10 years for a colonoscopy beginning at age 50 years and continuing until age 75 years.  Hepatitis C blood test.** / For all people born from 1945 through 1965 and any individual with known risks for hepatitis C.  Skin self-exam. / Monthly.  Influenza vaccine. / Every year.  Tetanus, diphtheria, and acellular pertussis (Tdap/Td) vaccine.** / Consult your health care provider. Pregnant women should receive 1 dose of Tdap vaccine during each pregnancy. 1 dose of Td every 10 years.  Varicella vaccine.** / Consult your health care provider. Pregnant females who do not have evidence of immunity should receive the first dose after pregnancy.  Zoster vaccine.** / 1 dose for adults aged 60 years or older.  Measles, mumps, rubella (MMR) vaccine.** / You need at least 1 dose of MMR if you were born in 1957 or later. You may also need a 2nd dose. For females of childbearing age, rubella immunity should be determined. If there is no evidence of immunity, females who are not pregnant should be vaccinated. If there is no evidence of immunity, females who are pregnant should delay immunization until after pregnancy.  Pneumococcal 13-valent conjugate (PCV13) vaccine.** / Consult your health care provider.  Pneumococcal polysaccharide (PPSV23) vaccine.** / 1 to 2 doses if  you smoke cigarettes or if you have certain conditions.  Meningococcal vaccine.** / Consult your health care provider.  Hepatitis A vaccine.** / Consult your health care provider.  Hepatitis B vaccine.** / Consult your health care provider.  Haemophilus influenzae type b (Hib) vaccine.** / Consult your health care provider.  Ages 65 years and over  Blood pressure check.** / Every 1 to 2 years.  Lipid and cholesterol check.** / Every 5 years beginning at age 20 years.  Lung cancer screening. / Every year if you are aged 55 80 years and have a 30-pack-year history of smoking and currently smoke or have quit within the past 15 years. Yearly screening is stopped once you have quit smoking for at least 15 years or develop a health problem that   would prevent you from having lung cancer treatment.  Clinical breast exam.** / Every year after age 103 years.  BRCA-related cancer risk assessment.** / For women who have family members with a BRCA-related cancer (breast, ovarian, tubal, or peritoneal cancers).  Mammogram.** / Every year beginning at age 36 years and continuing for as long as you are in good health. Consult with your health care provider.  Pap test.** / Every 3 years starting at age 5 years through age 85 or 10 years with 3 consecutive normal Pap tests. Testing can be stopped between 65 and 70 years with 3 consecutive normal Pap tests and no abnormal Pap or HPV tests in the past 10 years.  HPV screening.** / Every 3 years from ages 93 years through ages 70 or 45 years with a history of 3 consecutive normal Pap tests. Testing can be stopped between 65 and 70 years with 3 consecutive normal Pap tests and no abnormal Pap or HPV tests in the past 10 years.  Fecal occult blood test (FOBT) of stool. / Every year beginning at age 8 years and continuing until age 45 years. You may not need to do this test if you get a colonoscopy every 10 years.  Flexible sigmoidoscopy or colonoscopy.** /  Every 5 years for a flexible sigmoidoscopy or every 10 years for a colonoscopy beginning at age 69 years and continuing until age 68 years.  Hepatitis C blood test.** / For all people born from [birth year] through 1964 and any individual with known risks for hepatitis C.  Osteoporosis screening.** / A one-time screening for women ages 7 years and over and women at risk for fractures or osteoporosis.  Skin self-exam. / Monthly.  Influenza vaccine. / Every year.  Tetanus, diphtheria, and acellular pertussis (Tdap/Td) vaccine.** / 1 dose of Td every 10 years.  Varicella vaccine.** / Consult your health care provider.  Zoster vaccine.** / 1 dose for adults aged 5 years or older.  Pneumococcal 13-valent conjugate (PCV13) vaccine.** / Consult your health care provider.  Pneumococcal polysaccharide (PPSV23) vaccine.** / 1 dose for all adults aged 74 years and older.  Meningococcal vaccine.** / Consult your health care provider.  Hepatitis A vaccine.** / Consult your health care provider.  Hepatitis B vaccine.** / Consult your health care provider.  Haemophilus influenzae type b (Hib) vaccine.** / Consult your health care provider. ** Family history and personal history of risk and conditions may change your health care provider's recommendations. Document Released: 06/10/2001 Document Revised: 02/02/2013  [facility] Patient Information 2014 [place], [place].   EXERCISE AND DIET:  We recommended that you start or continue a regular exercise program for good health. Regular exercise means any activity that makes your heart beat faster and makes you sweat.  We recommend exercising at least 30 minutes per day at least 3 days a week, preferably 5.  We also recommend a diet low in fat and sugar / carbohydrates.  Inactivity, poor dietary choices and obesity can cause diabetes, heart attack, stroke, and kidney damage, among others.     ALCOHOL AND SMOKING:  Women should limit their alcohol intake to no  more than 7 drinks/beers/glasses of wine (combined, not each!) per week. Moderation of alcohol intake to this level decreases your risk of breast cancer and liver damage.  ( And of course, no recreational drugs are part of a healthy lifestyle.)  Also, you should not be smoking at all or even being exposed to second hand smoke. Most people know smoking can  cause cancer, and various heart and lung diseases, but did you know it also contributes to weakening of your bones?  Aging of your skin?  Yellowing of your teeth and nails?   CALCIUM AND VITAMIN D:  Adequate intake of calcium and Vitamin D are recommended.  The recommendations for exact amounts of these supplements seem to change often, but generally speaking 600 mg of calcium (either carbonate or citrate) and 800 units of Vitamin D per day seems prudent. Certain women may benefit from higher intake of Vitamin D.  If you are among these women, your doctor will have told you during your visit.     PAP SMEARS:  Pap smears, to check for cervical cancer or precancers,  have traditionally been done yearly, although recent scientific advances have shown that most women can have pap smears less often.  However, every woman still should have a physical exam from her gynecologist or primary care physician every year. It will include a breast check, inspection of the vulva and vagina to check for abnormal growths or skin changes, a visual exam of the cervix, and then an exam to evaluate the size and shape of the uterus and ovaries.  And after 62 years of age, a rectal exam is indicated to check for rectal cancers. We will also provide age appropriate advice regarding health maintenance, like when you should have certain vaccines, screening for sexually transmitted diseases, bone density testing, colonoscopy, mammograms, etc.    MAMMOGRAMS:  All women over 25 years old should have a yearly mammogram. Many facilities now offer a "3D" mammogram, which may cost  around $50 extra out of pocket. If possible,  we recommend you accept the option to have the 3D mammogram performed.  It both reduces the number of women who will be called back for extra views which then turn out to be normal, and it is better than the routine mammogram at detecting truly abnormal areas.     COLONOSCOPY:  Colonoscopy to screen for colon cancer is recommended for all women at age 21.  We know, you hate the idea of the prep.  We agree, BUT, having colon cancer and not knowing it is worse!!  Colon cancer so often starts as a polyp that can be seen and removed at colonscopy, which can quite literally save your life!  And if your first colonoscopy is normal and you have no family history of colon cancer, most women don't have to have it again for 10 years.  Once every ten years, you can do something that may end up saving your life, right?  We will be happy to help you get it scheduled when you are ready.  Be sure to check your insurance coverage so you understand how much it will cost.  It may be covered as a preventative service at no cost, but you should check your particular policy.    Blood pressure, weight, labs- stable. Remain well hydrated and follow heart healthy diet. Continue all medications as directed. Remain as active as possible. Please check blood pressure, heart rate several times/week-please call clinic if consistently >140/90 or <100/60. Continue to social distance and wear a mask when in public. Follow-up 3 months. GREAT TO SEE YOU!

## 2019-01-06 NOTE — Assessment & Plan Note (Addendum)
Cymbalta 60mg  QD

## 2019-01-06 NOTE — Assessment & Plan Note (Signed)
12/30/2018  Lipid Panel-  Tot-155  TGs-133  HDL-45  LDL-86  A1c-5.6  Currently on Atorvastatin 40mg  QD

## 2019-01-18 LAB — COLOGUARD: Cologuard: NEGATIVE

## 2019-01-25 ENCOUNTER — Encounter: Payer: Self-pay | Admitting: Gynecology

## 2019-01-28 ENCOUNTER — Ambulatory Visit: Payer: Medicare Other | Admitting: Cardiovascular Disease

## 2019-02-07 NOTE — Progress Notes (Signed)
MyChart message sent to pt with results.  Charyl Bigger, CMA

## 2019-02-08 ENCOUNTER — Encounter: Payer: Self-pay | Admitting: Acute Care

## 2019-02-15 ENCOUNTER — Telehealth: Payer: Self-pay | Admitting: Adult Health

## 2019-02-15 NOTE — Telephone Encounter (Signed)
Patient is requesting a refill of her atorvastatin, if approved please send to [facility]

## 2019-02-15 NOTE — Telephone Encounter (Signed)
Pt informed that RX was sent to [facility] on 12/29/2018 for a years supply.  Advised pt to call [facility] for refills.  Pt expressed understanding.  Charyl Bigger, CMA

## 2019-03-22 ENCOUNTER — Other Ambulatory Visit: Payer: Self-pay | Admitting: Adult Health

## 2019-03-23 ENCOUNTER — Telehealth: Payer: Self-pay | Admitting: Adult Health

## 2019-03-23 NOTE — Telephone Encounter (Signed)
Per patient [facility] states provider denied refill request for :   losartan (COZAAR) 100 MG tablet PO:[id number]   Order Details Dose: 100 mg Route: Oral Frequency: Daily  Dispense Quantity: 90 tablet Refills: 1 Fills remaining: --        Sig: Take 1 tablet (100 mg total) by mouth daily      ---Patient request med asst contact pharmacy to see why refill denied( when she should hv (1) refill left.  Please call :  [place], [place] [phone] (Phone) [phone] (Fax)   --glh

## 2019-03-23 NOTE — Telephone Encounter (Signed)
Advised pt that RX was sent on 01/06/2019 to [facility] for a 90 day supply with 1 additional refill.  Advised pt that it is too early for [place] to send a refill to the pharmacy.  Advised pt to call [facility] to discuss this issue.  Pt expressed understanding and is agreeable.  Charyl Bigger, CMA

## 2019-04-06 ENCOUNTER — Other Ambulatory Visit: Payer: Self-pay | Admitting: Adult Health
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
## 2019-04-07 ENCOUNTER — Encounter: Payer: Self-pay | Admitting: Adult Health

## 2019-04-07 ENCOUNTER — Other Ambulatory Visit: Payer: Self-pay

## 2019-04-07 ENCOUNTER — Ambulatory Visit (INDEPENDENT_AMBULATORY_CARE_PROVIDER_SITE_OTHER): Payer: Medicare Other | Admitting: Adult Health

## 2019-04-07 ENCOUNTER — Telehealth: Payer: Self-pay

## 2019-04-07 DIAGNOSIS — Z0000 Encounter for general adult medical examination without abnormal findings: Secondary | ICD-10-CM | POA: Diagnosis not present

## 2019-04-07 DIAGNOSIS — I10 Essential (primary) hypertension: Secondary | ICD-10-CM

## 2019-04-07 MED ORDER — AMLODIPINE BESYLATE 10 MG PO TABS: 10.0000 mg | ORAL_TABLET | Freq: Every day | ORAL | 0 refills | Status: DC
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
## 2019-04-07 NOTE — Telephone Encounter (Signed)
Please call pt to schedule appt.  No further refills until pt is seen.  T. Alonso Gapinski, CMA  

## 2019-04-07 NOTE — Telephone Encounter (Signed)
Please disregard previous message.  Pt is scheduled for today with Mina Marble, NP.  Charyl Bigger, CMA

## 2019-05-17 ENCOUNTER — Telehealth: Payer: Self-pay | Admitting: Adult Health

## 2019-05-17 MED ORDER — ATORVASTATIN CALCIUM 40 MG PO TABS: ORAL_TABLET | ORAL | 0 refills | Status: DC

## 2019-05-17 NOTE — Addendum Note (Signed)
Addended by: Fonnie Mu on: 05/17/2019 02:09 PM   Modules accepted: Orders

## 2019-05-17 NOTE — Telephone Encounter (Signed)
Patient is requesting a refill of her atorvastatin, she is changing from [facility] to [facility] order pharm (front staff has made that change).

## 2019-05-19 ENCOUNTER — Other Ambulatory Visit: Payer: Self-pay

## 2019-05-19 MED ORDER — DULOXETINE HCL 30 MG PO CPEP: ORAL_CAPSULE | ORAL | 0 refills | Status: DC

## 2019-05-19 MED ORDER — ATORVASTATIN CALCIUM 40 MG PO TABS: ORAL_TABLET | ORAL | 0 refills | Status: DC

## 2019-05-19 MED ORDER — LOSARTAN POTASSIUM 100 MG PO TABS: 100.0000 mg | ORAL_TABLET | Freq: Every day | ORAL | 0 refills | Status: DC

## 2019-05-19 NOTE — Telephone Encounter (Signed)
Although refills have been requested early, pt has changed pharmacies and needs new RXs.  Charyl Bigger, CMA

## 2019-05-25 ENCOUNTER — Other Ambulatory Visit: Payer: Self-pay

## 2019-05-25 MED ORDER — AMLODIPINE BESYLATE 10 MG PO TABS: 10.0000 mg | ORAL_TABLET | Freq: Every day | ORAL | 0 refills | Status: DC

## 2019-05-25 MED ORDER — ATORVASTATIN CALCIUM 40 MG PO TABS: ORAL_TABLET | ORAL | 0 refills | Status: DC

## 2019-05-25 MED ORDER — DULOXETINE HCL 30 MG PO CPEP: ORAL_CAPSULE | ORAL | 0 refills | Status: DC

## 2019-05-25 MED ORDER — GABAPENTIN 300 MG PO CAPS: ORAL_CAPSULE | ORAL | 0 refills | Status: DC

## 2019-05-25 MED ORDER — LOSARTAN POTASSIUM 100 MG PO TABS: 100.0000 mg | ORAL_TABLET | Freq: Every day | ORAL | 0 refills | Status: DC

## 2019-05-25 MED ORDER — CLOPIDOGREL BISULFATE 75 MG PO TABS: 75.0000 mg | ORAL_TABLET | Freq: Every day | ORAL | 0 refills | Status: DC

## 2019-05-25 NOTE — Telephone Encounter (Signed)
Received refill request from [facility] for amlodipine, clopidogrel and gabapentin.  Pt has changed pharmacies from [facility] to [facility].  New RXs sent to pharmacy.  Charyl Bigger, CMA

## 2019-05-28 ENCOUNTER — Other Ambulatory Visit: Payer: Self-pay | Admitting: Adult Health

## 2019-06-02 DIAGNOSIS — H5203 Hypermetropia, bilateral: Secondary | ICD-10-CM | POA: Diagnosis not present

## 2019-06-03 DIAGNOSIS — Z0100 Encounter for examination of eyes and vision without abnormal findings: Secondary | ICD-10-CM | POA: Diagnosis not present
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
## 2019-06-08 ENCOUNTER — Ambulatory Visit (INDEPENDENT_AMBULATORY_CARE_PROVIDER_SITE_OTHER): Payer: Medicare HMO | Admitting: Adult Health

## 2019-06-08 ENCOUNTER — Encounter: Payer: Self-pay | Admitting: Adult Health

## 2019-06-08 ENCOUNTER — Other Ambulatory Visit: Payer: Self-pay

## 2019-06-08 VITALS — BP 132/84 | Temp 98.6°F | Ht 61.0 in | Wt 162.0 lb

## 2019-06-08 DIAGNOSIS — F439 Reaction to severe stress, unspecified: Secondary | ICD-10-CM

## 2019-06-08 NOTE — Assessment & Plan Note (Signed)
Assessment and Plan: Continue all medications as directed. Referral placed to BH/Psychology. Remain well hydrated, follow heart healthy diet. Continue to abstain from tobacco/vape/ETOH use. Walk daily. If you EVER feel in danger call 911. Call Social Worker today to inquire about affordable, pet-friendly housing (she has established SW). Continue to social distance and wear a mask when public.  Follow Up Instructions: 3 months with primary care   I discussed the assessment and treatment plan with the patient. The patient was provided an opportunity to ask questions and all were answered. The patient agreed with the plan and demonstrated an understanding of the instructions.   The patient was advised to call back or seek an in-person evaluation if the symptoms worsen or if the condition fails to improve as anticipated.

## 2019-06-09 ENCOUNTER — Other Ambulatory Visit: Payer: Self-pay | Admitting: Adult Health

## 2019-06-13 ENCOUNTER — Other Ambulatory Visit: Payer: Self-pay | Admitting: Adult Health

## 2019-06-20 ENCOUNTER — Ambulatory Visit: Payer: Medicare HMO | Admitting: Psychology

## 2019-07-04 ENCOUNTER — Ambulatory Visit: Payer: Medicare HMO | Admitting: Psychology

## 2019-07-11 ENCOUNTER — Ambulatory Visit: Payer: Medicare HMO | Admitting: Psychology

## 2019-07-22 ENCOUNTER — Other Ambulatory Visit: Payer: Self-pay | Admitting: Adult Health

## 2019-07-25 ENCOUNTER — Other Ambulatory Visit: Payer: Self-pay | Admitting: Adult Health

## 2019-07-25 MED ORDER — AMLODIPINE BESYLATE 10 MG PO TABS: 10.0000 mg | ORAL_TABLET | Freq: Every day | ORAL | 0 refills | Status: DC

## 2019-07-25 MED ORDER — LOSARTAN POTASSIUM 100 MG PO TABS: 100.0000 mg | ORAL_TABLET | Freq: Every day | ORAL | 0 refills | Status: DC

## 2019-07-25 NOTE — Telephone Encounter (Signed)
Losartan and Amlodipine refilled. Clopidogrel was refilled in February and was given 90 day supply then-refill of this med not appropriate. AS, CMA

## 2019-07-25 NOTE — Telephone Encounter (Signed)
Per patient Heidi Foster was suppose to refill Rx for 90dys or 3 month on the LOV 06/09/19---  --Forwarding request to med asst to send refill order for these meds :   1)--amLODipine (NORVASC) 10 MG tablet [id number]   Order Details Dose: 10 mg Route: Oral Frequency: Daily  Dispense Quantity: 90 tablet Refills: 0       Sig: Take 1 tablet (10 mg total) by mouth daily.        2)---losartan (COZAAR) 100 MG tablet [id number]   Order Details Dose: 100 mg Route: Oral Frequency: Daily  Dispense Quantity: 90 tablet Refills: 0       Sig: Take 1 tablet (100 mg total) by mouth daily.       3)---clopidogrel (PLAVIX) 75 MG tablet [id number]   Order Details Dose: 75 mg Route: Oral Frequency: Daily  Dispense Quantity: 90 tablet Refills: 0   Note to Pharmacy: Requesting 1 year supply      Sig: Take 1 tablet (75 mg total) by mouth daily.       ---- Send refill order to :   [place], [place] [phone] (Phone) [phone] (Fax)   --glh

## 2019-08-17 ENCOUNTER — Other Ambulatory Visit: Payer: Self-pay | Admitting: Family Medicine

## 2019-09-21 ENCOUNTER — Other Ambulatory Visit: Payer: Self-pay | Admitting: Adult Health

## 2019-09-22 NOTE — Telephone Encounter (Signed)
Please call pt to schedule appt.  No further refills until pt is seen.  T. Kayleb Warshaw, CMA  

## 2019-10-04 ENCOUNTER — Telehealth: Payer: Self-pay | Admitting: Physician Assistant

## 2019-10-14 ENCOUNTER — Other Ambulatory Visit: Payer: Self-pay | Admitting: Physician Assistant

## 2019-10-19 ENCOUNTER — Telehealth: Payer: Self-pay | Admitting: Physician Assistant

## 2019-10-19 MED ORDER — LOSARTAN POTASSIUM 100 MG PO TABS: 100.0000 mg | ORAL_TABLET | Freq: Every day | ORAL | 0 refills | Status: DC

## 2019-10-19 NOTE — Telephone Encounter (Signed)
#  60 DAY refill sent to pharmacy. Patient needs apt for further refills. Please contact patient to schedule as advised at last OV. AS, CMA

## 2019-10-19 NOTE — Addendum Note (Signed)
Addended by: Mickel Crow on: 10/19/2019 12:01 PM   Modules accepted: Orders

## 2019-10-19 NOTE — Telephone Encounter (Signed)
Patient is requesting a refill of her losartan, if approved please send to [facility]

## 2019-11-07 ENCOUNTER — Telehealth: Payer: Self-pay | Admitting: Physician Assistant

## 2019-11-07 MED ORDER — AMLODIPINE BESYLATE 10 MG PO TABS: 10.0000 mg | ORAL_TABLET | Freq: Every day | ORAL | 0 refills | Status: DC

## 2019-11-07 NOTE — Telephone Encounter (Signed)
Please call pt to schedule f/u appt.  No further refills until pt is seen.  T. Teran Knittle, CMA 

## 2019-11-07 NOTE — Addendum Note (Signed)
Addended by: Fonnie Mu on: 11/07/2019 09:55 AM   Modules accepted: Orders

## 2019-11-07 NOTE — Telephone Encounter (Signed)
Patient is requesting a refill of her amlodipine, if approved please send to [facility] order Pharm

## 2019-11-09 ENCOUNTER — Other Ambulatory Visit: Payer: Self-pay | Admitting: Physician Assistant

## 2019-11-14 ENCOUNTER — Telehealth: Payer: Self-pay | Admitting: Physician Assistant

## 2019-11-14 MED ORDER — DULOXETINE HCL 30 MG PO CPEP: 60.0000 mg | ORAL_CAPSULE | Freq: Every day | ORAL | 0 refills | Status: DC

## 2019-11-14 NOTE — Telephone Encounter (Signed)
Amlodipine has already been sent to pharmacy.   Sending Duloxetine to requested pharmacy. AS, CMA

## 2019-11-14 NOTE — Telephone Encounter (Signed)
Patient needs a refill on Duloxetine, and amlodipine.   [facility] pharmacy insurance

## 2019-11-14 NOTE — Addendum Note (Signed)
Addended by: Mickel Crow on: 11/14/2019 03:47 PM   Modules accepted: Orders

## 2019-11-22 ENCOUNTER — Telehealth: Payer: Self-pay | Admitting: Physician Assistant

## 2019-11-22 NOTE — Telephone Encounter (Signed)
Patient is requesting a refill of her gabapentin and atorvastatin, if approved please send to [facility]

## 2019-11-23 MED ORDER — ATORVASTATIN CALCIUM 40 MG PO TABS: 40.0000 mg | ORAL_TABLET | Freq: Every day | ORAL | 0 refills | Status: DC

## 2019-11-23 MED ORDER — GABAPENTIN 300 MG PO CAPS: 300.0000 mg | ORAL_CAPSULE | Freq: Every day | ORAL | 0 refills | Status: DC

## 2019-11-23 NOTE — Addendum Note (Signed)
Addended by: Mickel Crow on: 11/23/2019 08:27 AM   Modules accepted: Orders

## 2019-11-23 NOTE — Telephone Encounter (Signed)
REFILLS SENT TO REQUESTED PHARMACY. As, cma

## 2019-12-02 ENCOUNTER — Other Ambulatory Visit: Payer: Self-pay | Admitting: Physician Assistant

## 2019-12-12 ENCOUNTER — Other Ambulatory Visit: Payer: Self-pay | Admitting: Physician Assistant

## 2019-12-15 ENCOUNTER — Telehealth: Payer: Self-pay | Admitting: Physician Assistant

## 2019-12-15 MED ORDER — CLOPIDOGREL BISULFATE 75 MG PO TABS: 75.0000 mg | ORAL_TABLET | Freq: Every day | ORAL | 0 refills | Status: DC

## 2019-12-15 NOTE — Addendum Note (Signed)
Addended by: Mickel Crow on: 12/15/2019 03:52 PM   Modules accepted: Orders

## 2019-12-15 NOTE — Telephone Encounter (Signed)
Patient is requesting a refill of her Plavix, if approved please send to [facility]

## 2019-12-15 NOTE — Telephone Encounter (Signed)
Refill sent to requested pharmacy. AS, CMA 

## 2019-12-26 ENCOUNTER — Ambulatory Visit (INDEPENDENT_AMBULATORY_CARE_PROVIDER_SITE_OTHER): Payer: Medicare HMO | Admitting: Physician Assistant

## 2019-12-26 ENCOUNTER — Other Ambulatory Visit: Payer: Self-pay

## 2019-12-26 ENCOUNTER — Encounter: Payer: Self-pay | Admitting: Physician Assistant

## 2019-12-26 VITALS — BP 121/54 | HR 82 | Temp 98.6°F | Ht 62.0 in | Wt 157.0 lb

## 2019-12-26 DIAGNOSIS — G2581 Restless legs syndrome: Secondary | ICD-10-CM

## 2019-12-26 DIAGNOSIS — I10 Essential (primary) hypertension: Secondary | ICD-10-CM | POA: Diagnosis not present

## 2019-12-26 DIAGNOSIS — L989 Disorder of the skin and subcutaneous tissue, unspecified: Secondary | ICD-10-CM | POA: Diagnosis not present

## 2019-12-26 DIAGNOSIS — M79605 Pain in left leg: Secondary | ICD-10-CM

## 2019-12-26 DIAGNOSIS — Z8673 Personal history of transient ischemic attack (TIA), and cerebral infarction without residual deficits: Secondary | ICD-10-CM

## 2019-12-26 DIAGNOSIS — E7849 Other hyperlipidemia: Secondary | ICD-10-CM | POA: Diagnosis not present

## 2019-12-26 DIAGNOSIS — M79604 Pain in right leg: Secondary | ICD-10-CM | POA: Diagnosis not present

## 2019-12-26 MED ORDER — CLOPIDOGREL BISULFATE 75 MG PO TABS: 75.0000 mg | ORAL_TABLET | Freq: Every day | ORAL | 1 refills | Status: DC

## 2019-12-26 MED ORDER — GABAPENTIN 300 MG PO CAPS: ORAL_CAPSULE | ORAL | 0 refills | Status: DC

## 2019-12-26 MED ORDER — ATORVASTATIN CALCIUM 40 MG PO TABS: 40.0000 mg | ORAL_TABLET | Freq: Every day | ORAL | 1 refills | Status: DC

## 2019-12-26 MED ORDER — AMLODIPINE BESYLATE 10 MG PO TABS: 10.0000 mg | ORAL_TABLET | Freq: Every day | ORAL | 1 refills | Status: DC

## 2019-12-26 NOTE — Progress Notes (Signed)
Telehealth office visit note for Heidi Reid, PA-C- at Primary Care at [facility]   I connected with current patient today by telephone and verified that I am speaking with the correct person   . Location of the patient: Home . Location of the provider: Office - This visit type was conducted due to national recommendations for restrictions regarding the COVID-19 Pandemic (e.g. social distancing) in an effort to limit this patient's exposure and mitigate transmission in our community.    - No physical exam could be performed with this format, beyond that communicated to [place] by the patient/ family members as noted.   - Additionally my office staff/ schedulers were to discuss with the patient that there may be a monetary charge related to this service, depending on their medical insurance.  My understanding is that patient understood and consented to proceed.     _________________________________________________________________________________   History of Present Illness: Pt calls in to follow-up on hypertension.  Patient states she is having trouble with sleeping due to bilateral leg pain/RLS.  Reports compliance with gabapentin 300 mg.  She has tried over-the-counter melatonin which did not help with sleep.  Also requesting a dermatology referral.  Reports she had a keratosis lesion removed and has a new one that appeared on her face, and wants it removed.  HTN: Pt denies chest pain, palpitations, dizziness or leg swelling. Taking medication as directed without side effects. Checks BP at home  and readings range in 120s/50-80. Pt follows a low salt diet.  HLD: Pt taking medication as directed without issues. Denies side effects including myalgias and RUQ pain.  Reports she does watch what she eats and monitors fried foods.  Mood: States she is feeling better and has learned how to manage with stress.  She has resumed crochet which is something she enjoys and helps with relaxation.   No  flowsheet data found.  Depression screen [facility] 2/9 12/26/2019 06/08/2019 01/06/2019 10/07/2018 05/06/2018  Decreased Interest 0 1 0 1 0  Down, Depressed, Hopeless 0 1 0 0 0  PHQ - 2 Score 0 2 0 1 0  Altered sleeping 3 0 0 1 2  Tired, decreased energy 0 0 0 0 0  Change in appetite 0 0 0 0 0  Feeling bad or failure about yourself  0 1 0 0 0  Trouble concentrating 0 0 0 0 0  Moving slowly or fidgety/restless 0 0 0 0 0  Suicidal thoughts 0 0 0 0 0  PHQ-9 Score 3 3 0 2 2  Difficult doing work/chores Not difficult at all Not difficult at all - Not difficult at all Not difficult at all  Some recent data might be hidden      Impression and Recommendations:     1. Hypertension, unspecified type   2. History of CVA (cerebrovascular accident)   3. Other hyperlipidemia   4. Restless leg syndrome   5. Leg pain, bilateral   6. Skin lesion- hx of keratosis      Hypertension: -BP stable -Continue current medication regimen. -Continue ambulatory BP and pulse monitoring. -Continue low-sodium diet. -Stay well-hydrated.  Other hyperlipidemia: -Last lipid panel WNL, LDL 86 -Continue Lipitor -Follow a heart healthy diet and stay as active as possible. -Plan to recheck lipid panel hepatic function with CPE.  History of CVA: -Stable -On Plavix.  Provided refill.  Restless leg syndrome, bilateral leg pain: -Discussed with patient increasing gabapentin to 600 mg before bedtime to help improve symptoms and  insomnia.  Patient is agreeable.   -Recommend local massage or cold compress. -Will continue to monitor.  Skin lesion: -Placed dermatology referral.  - As part of my medical decision making, I reviewed the following data within the [place] History obtained from pt /family, CMA notes reviewed and incorporated if applicable, Labs reviewed, Radiograph/ tests reviewed if applicable and OV notes from prior OV's with me, as well as any other specialists she/he has seen since  seeing me last, were all reviewed and used in my medical decision making process today.    - Additionally, when appropriate, discussion had with patient regarding our treatment plan, and their biases/concerns about that plan were used in my medical decision making today.    - The patient agreed with the plan and demonstrated an understanding of the instructions.   No barriers to understanding were identified.     - The patient was advised to call back or seek an in-person evaluation if the symptoms worsen or if the condition fails to improve as anticipated.   Return in about 3 months (around 03/27/2020) for CPE and FBW.    Orders Placed This Encounter  Procedures  . Ambulatory referral to Dermatology    Meds ordered this encounter  Medications  . clopidogrel (PLAVIX) 75 MG tablet    Sig: Take 1 tablet (75 mg total) by mouth daily.    Dispense:  90 tablet    Refill:  1    Requesting 1 year supply  . atorvastatin (LIPITOR) 40 MG tablet    Sig: Take 1 tablet (40 mg total) by mouth daily.    Dispense:  90 tablet    Refill:  1  . amLODipine (NORVASC) 10 MG tablet    Sig: Take 1 tablet (10 mg total) by mouth daily.    Dispense:  90 tablet    Refill:  1  . gabapentin (NEURONTIN) 300 MG capsule    Sig: TAKE 2 CAPSULE BY MOUTH AT  BEDTIME    Dispense:  60 capsule    Refill:  0    Requesting 1 year supply    Order Specific Question:   Supervising Provider    Answer:   Beatrice Lecher D [[id number]]    Medications Discontinued During This Encounter  Medication Reason  . amLODipine (NORVASC) 10 MG tablet Reorder  . atorvastatin (LIPITOR) 40 MG tablet Reorder  . clopidogrel (PLAVIX) 75 MG tablet Reorder  . gabapentin (NEURONTIN) 300 MG capsule        Time spent on visit including pre-visit chart review and post-visit care was 15 minutes.      The [place] was signed into law in 2016 which includes the topic of electronic health records.  This provides immediate  access to information in MyChart.  This includes consultation notes, operative notes, office notes, lab results and pathology reports.  If you have any questions about what you read please let us know at your next visit or call us at the office.  We are right here with you.  Note:  This note was prepared with assistance of Dragon voice recognition software. Occasional wrong-word or sound-a-like substitutions may have occurred due to the inherent limitations of voice recognition software.  __________________________________________________________________________________     Patient Care Team    Relationship Specialty Notifications Start End  Heidi Foster, [place] PCP - General Physician Assistant  10/17/19      -Vitals obtained; medications/ allergies reconciled;  personal medical, social, Sx etc.histories were  updated by CMA, reviewed by me and are reflected in chart   Patient Active Problem List   Diagnosis Date Noted  . Stress at home 06/08/2019  . Animal bite of finger 10/07/2018  . Acute pain of right knee 11/09/2017  . Knee swelling 11/09/2017  . Restless leg syndrome 07/28/2017  . Low serum creatine 06/30/2017  . Acute bacterial conjunctivitis of left eye 06/30/2017  . Insomnia 04/01/2017  . Cerebral aneurysm   . Stroke ([place]) 03/15/2017  . Paresthesia of both hands 03/09/2017  . Hx of transient ischemic attack (TIA) 03/09/2017  . Fibromyalgia 01/22/2017  . Breast pain, left 12/15/2016  . Generalized weakness 12/15/2016  . Leg pain, bilateral 12/15/2016  . 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 Hoarseness 10/08/2016  . Laryngopharyngeal reflux (LPR) 10/08/2016  . Centrilobular emphysema ([place]) 09/09/2016  . GERD (gastroesophageal reflux disease) 06/30/2016  . Dysphagia 06/30/2016  . Health care maintenance 06/25/2016  . Other fatigue 05/26/2016  . Hypertension 05/26/2016  . Other hyperlipidemia 05/26/2016  . Family history of diabetes mellitus in mother 05/26/2016  . Acute  pain of right shoulder 05/26/2016  . Tobacco use disorder 05/26/2016  . Dyspnea on exertion 05/26/2016  . Chronic pain of right knee 05/26/2016  . History of MI (myocardial infarction) 05/26/2016  . History of TIA (transient ischemic attack) 05/26/2016  . Numbness and tingling of left lower extremity 05/26/2016  . Numbness and tingling in left hand 05/26/2016  . 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 albuterol (PROVENTIL HFA;VENTOLIN HFA) 108 (90 Base) MCG/ACT inhaler Inhale 2 puffs into the lungs every 4 (four) hours as needed for wheezing or shortness of breath.  Marland Kitchen amLODipine (NORVASC) 10 MG tablet Take 1 tablet (10 mg total) by mouth daily.  Marland Kitchen atorvastatin (LIPITOR) 40 MG tablet Take 1 tablet (40 mg total) by mouth daily.  . clopidogrel (PLAVIX) 75 MG tablet Take 1 tablet (75 mg total) by mouth daily.  . DULoxetine (CYMBALTA) 30 MG capsule Take 2 capsules (60 mg total) by mouth daily. TAKE 2 CAPSULES EVERY DAY  . gabapentin (NEURONTIN) 300 MG capsule TAKE 2 CAPSULE BY MOUTH AT  BEDTIME  . losartan (COZAAR) 100 MG tablet TAKE 1 TABLET EVERY DAY (NEED MD APPOINTMENT)  . Multiple Vitamin (MULTIVITAMIN WITH MINERALS) TABS tablet Take 1 tablet daily by mouth.  . Spacer/Aero-Holding Chambers (AEROCHAMBER MV) inhaler Use as instructed  . umeclidinium-vilanterol (ANORO ELLIPTA) 62.5-25 MCG/INH AEPB Inhale 1 puff daily into the lungs.  . [DISCONTINUED] amLODipine (NORVASC) 10 MG tablet Take 1 tablet (10 mg total) by mouth daily. **PATIENT NEEDS APT FOR FURTHER REFILLS**  . [DISCONTINUED] atorvastatin (LIPITOR) 40 MG tablet Take 1 tablet (40 mg total) by mouth daily. **PATIENT NEEDS APT FOR FURTHER REFILLS**  . [DISCONTINUED] clopidogrel (PLAVIX) 75 MG tablet Take 1 tablet (75 mg total) by mouth daily. **PATIENT NEEDS APT FOR FURTHER REFILLS**  . [DISCONTINUED] gabapentin (NEURONTIN) 300 MG capsule Take 1 capsule (300 mg total) by mouth at bedtime. TAKE 1 CAPSULE BY MOUTH  AT  BEDTIME**PATIENT NEEDS APT FOR FURTHER REFILLS**     Allergies:  Allergies  Allergen Reactions  . Codeine Anaphylaxis  . Bupropion Other (See Comments)    Anger and mental issues     ROS:  See above HPI for pertinent positives and negatives   Objective:   Blood pressure (!) 121/54, pulse 82, temperature 98.6 F (37 C), temperature source Oral, height 5\' 2"  (1.575 m), weight 157 lb (  71.2 kg).  (if some vitals are omitted, this means that patient was UNABLE to obtain them even though they were asked to get them prior to OV today.  They were asked to call us at their earliest convenience with these once obtained. ) General: A & O * 3; sounds in no acute distress; in usual state of health. t Respiratory: speaking in full sentences, no conversational dyspnea;  Psych: insight appears good, mood- appears full

## 2020-01-10 ENCOUNTER — Telehealth: Payer: Self-pay | Admitting: Physician Assistant

## 2020-01-10 MED ORDER — GABAPENTIN 300 MG PO CAPS: 600.0000 mg | ORAL_CAPSULE | Freq: Every day | ORAL | 0 refills | Status: DC

## 2020-01-10 NOTE — Telephone Encounter (Signed)
Refill sent to requested pharmacy. AS, CMA 

## 2020-01-10 NOTE — Telephone Encounter (Signed)
Patient called stating she is almost out of her gabapentin and uses [employer], so it takes a bit to get shipped to her. She is wondering if we can go ahead with that refill.  Also she is requesting automatic refill for this med as she consistently takes it and would like for it to be set to auto renewal if possible.

## 2020-01-10 NOTE — Addendum Note (Signed)
Addended by: Mickel Crow on: 01/10/2020 09:35 AM   Modules accepted: Orders

## 2020-01-11 ENCOUNTER — Other Ambulatory Visit: Payer: Self-pay | Admitting: Physician Assistant

## 2020-03-19 ENCOUNTER — Other Ambulatory Visit: Payer: Self-pay | Admitting: Physician Assistant

## 2020-03-20 ENCOUNTER — Telehealth: Payer: Self-pay | Admitting: Physician Assistant

## 2020-03-20 NOTE — Telephone Encounter (Signed)
Please call patient to schedule apt for further med refills. AS, CMA

## 2020-04-17 ENCOUNTER — Other Ambulatory Visit: Payer: Self-pay

## 2020-04-17 ENCOUNTER — Encounter: Payer: Self-pay | Admitting: Physician Assistant

## 2020-04-17 ENCOUNTER — Ambulatory Visit (INDEPENDENT_AMBULATORY_CARE_PROVIDER_SITE_OTHER): Payer: Medicare HMO | Admitting: Physician Assistant

## 2020-04-17 VITALS — BP 137/71 | HR 84 | Ht 62.0 in | Wt 161.2 lb

## 2020-04-17 DIAGNOSIS — M25561 Pain in right knee: Secondary | ICD-10-CM

## 2020-04-17 DIAGNOSIS — E7849 Other hyperlipidemia: Secondary | ICD-10-CM | POA: Diagnosis not present

## 2020-04-17 DIAGNOSIS — L989 Disorder of the skin and subcutaneous tissue, unspecified: Secondary | ICD-10-CM

## 2020-04-17 DIAGNOSIS — Z8673 Personal history of transient ischemic attack (TIA), and cerebral infarction without residual deficits: Secondary | ICD-10-CM | POA: Diagnosis not present

## 2020-04-17 DIAGNOSIS — Z23 Encounter for immunization: Secondary | ICD-10-CM

## 2020-04-17 DIAGNOSIS — I10 Essential (primary) hypertension: Secondary | ICD-10-CM

## 2020-04-17 DIAGNOSIS — J439 Emphysema, unspecified: Secondary | ICD-10-CM | POA: Diagnosis not present

## 2020-04-17 DIAGNOSIS — G2581 Restless legs syndrome: Secondary | ICD-10-CM

## 2020-04-17 MED ORDER — AMLODIPINE BESYLATE 10 MG PO TABS: 10.0000 mg | ORAL_TABLET | Freq: Every day | ORAL | 1 refills | Status: DC

## 2020-04-17 MED ORDER — CLOPIDOGREL BISULFATE 75 MG PO TABS: 75.0000 mg | ORAL_TABLET | Freq: Every day | ORAL | 1 refills | Status: DC

## 2020-04-17 MED ORDER — DULOXETINE HCL 30 MG PO CPEP: 60.0000 mg | ORAL_CAPSULE | Freq: Every day | ORAL | 1 refills | Status: DC

## 2020-04-17 MED ORDER — LOSARTAN POTASSIUM 100 MG PO TABS: 100.0000 mg | ORAL_TABLET | Freq: Every day | ORAL | 1 refills | Status: DC

## 2020-04-17 MED ORDER — GABAPENTIN 300 MG PO CAPS: 600.0000 mg | ORAL_CAPSULE | Freq: Every day | ORAL | 1 refills | Status: DC

## 2020-04-17 MED ORDER — ATORVASTATIN CALCIUM 40 MG PO TABS: 40.0000 mg | ORAL_TABLET | Freq: Every day | ORAL | 1 refills | Status: DC

## 2020-04-17 MED ORDER — TRIAMCINOLONE ACETONIDE 0.1 % EX CREA: 1.0000 "application " | TOPICAL_CREAM | Freq: Two times a day (BID) | CUTANEOUS | 0 refills | Status: DC

## 2020-04-17 NOTE — Progress Notes (Signed)
Established Patient Office Visit  Subjective:  Patient ID: Heidi Foster, female    DOB: December 13, 1955  Age: 64 y.o. MRN: [id number]  CC:  Chief Complaint  Patient presents with  . Hypertension  . Hyperlipidemia    HPI [name] presents for follow up on hypertension and hyperlipidemia.  Has complaints of right knee pain.  Reports a prior history of Baker's cyst and patellar fracture of right knee.  Has tried BenGay, Salonpas, and icy hot with minimal relief.  Has taken Tylenol which has helped provide some pain relief.  Reports warmth, denies redness, fever or recent fall or injury. Pain worsens with prolonged periods on feet. Also has itchy skin lesions.  Patient was referred to dermatology but there was an insurance issue.  HTN: Pt denies chest pain, palpitations, dizziness or edema. Taking medication as directed without side effects. Checks BP at home and readings range 130s/70s. Pt follows a low salt diet and tries to stay hydrated.  HLD: Pt taking medication as directed without issues. Denies side effects including myalgias and RUQ pain.   RLS: Reports symptoms are about the same despite increasing gabapentin to 600 mg.  Also takes duloxetine for fibromyalgia.  Past Medical History:  Diagnosis Date  . Allergy   . Anginal pain ([place])   . Arthritis   . Astigmatism   . 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 Closed fracture of right patella   . Emphysema of lung ([place])   . GERD (gastroesophageal reflux disease)   . Heart attack ([place]) 1999  . Hypertension   . Osteopenia 12/2016   T score -1.2 FRAX 7.4%/0.8%  . Seborrheic keratoses   . Stroke ([place])    TIA x 2, early 2000's   . Vocal cord polyp     Past Surgical History:  Procedure Laterality Date  . BREAST BIOPSY    . BREAST SURGERY     Left Breast nodule markers  . CARDIAC CATHETERIZATION     1999  . caridac cath    . LOOP RECORDER INSERTION N/A 03/17/2017   Procedure: LOOP RECORDER INSERTION;  Surgeon: Evans Lance, MD;   Location: [facility];  Service: Cardiovascular;  Laterality: N/A;  . lt. ovary removed    . MICROLARYNGOSCOPY WITH LASER Bilateral 11/13/2016   Procedure: MICROLARYNGOSCOPY;  Surgeon: Melissa Montane, MD;  Location: [facility] OR;  Service: ENT;  Laterality: Bilateral;  . polyp removal  10/2016   throat -   . SKIN BIOPSY     nose  . TEE WITHOUT CARDIOVERSION N/A 03/17/2017   Procedure: TRANSESOPHAGEAL ECHOCARDIOGRAM (TEE);  Surgeon: Dorothy Spark, MD;  Location[facility] END[facility]COPY;  Service: Cardiovascular;  Laterality: N/A;  . TUBAL LIGATION      Family History  Problem Relation Age of Onset  . Diabetes Mother   . Hypertension Mother   . Stroke Mother   . Alcohol abuse Father   . Multiple sclerosis Sister   . Cancer Brother        lung  . Stroke Maternal Grandmother   . Heart attack Maternal Grandmother   . Hypertension Maternal Grandmother   . Colon cancer Neg Hx   . Colon polyps Neg Hx   . Esophageal cancer Neg Hx   . Stomach cancer Neg Hx   . Rectal cancer Neg Hx     Social History   Socioeconomic History  . Marital status: Divorced    Spouse name: Not on file  . Number of  children: Not on file  . Years of education: Not on file  . Highest education level: Not on file  Occupational History  . Not on file  Tobacco Use  . Smoking status: Former Smoker    Packs/day: 0.50    Years: 40.00    Pack years: 20.00    Types: Cigarettes    Start date: 09/27/1976    Quit date: 12/26/2018    Years since quitting: 1.3  . Smokeless tobacco: Never Used  . Tobacco comment: Peak rate of 1ppd  Vaping Use  . Vaping Use: Some days  Substance and Sexual Activity  . Alcohol use: No  . Drug use: No  . Sexual activity: Never  Other Topics Concern  . Not on file  Social History Narrative   [facility] (06/30/16):   Originally from [place]. Moved to [facility] in April [place]. 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 Currently lives with her daughter & son-in-law in a  mobile home. Has a cat currently. No mold or TB exposure. Always had a negative skin PPD. Previously enjoyed crocheting.    Social Determinants of Health   Financial Resource Strain: Not on file  Food Insecurity: Not on file  Transportation Needs: Not on file  Physical Activity: Not on file  Stress: Not on file  Social Connections: Not on file  Intimate Partner Violence: Not on file    Outpatient Medications Prior to Visit  Medication Sig Dispense Refill  . albuterol (PROVENTIL HFA;VENTOLIN HFA) 108 (90 Base) MCG/ACT inhaler Inhale 2 puffs into the lungs every 4 (four) hours as needed for wheezing or shortness of breath. 1 Inhaler 3  . Multiple Vitamin (MULTIVITAMIN WITH MINERALS) TABS tablet Take 1 tablet daily by mouth.    . Spacer/Aero-Holding Chambers (AEROCHAMBER MV) inhaler Use as instructed 1 each 0  . umeclidinium-vilanterol (ANORO ELLIPTA) 62.5-25 MCG/INH AEPB Inhale 1 puff daily into the lungs.    Marland Kitchen amLODipine (NORVASC) 10 MG tablet Take 1 tablet (10 mg total) by mouth daily. 90 tablet 1  . atorvastatin (LIPITOR) 40 MG tablet Take 1 tablet (40 mg total) by mouth daily. 90 tablet 1  . clopidogrel (PLAVIX) 75 MG tablet Take 1 tablet (75 mg total) by mouth daily. 90 tablet 1  . DULoxetine (CYMBALTA) 30 MG capsule Take 2 capsules (60 mg total) by mouth daily. **NEEDS APT FOR FURTHER REFILLS** 120 capsule 0  . gabapentin (NEURONTIN) 300 MG capsule Take 2 capsules (600 mg total) by mouth at bedtime. TAKE 2 CAPSULE BY MOUTH AT  BEDTIME 180 capsule 0  . losartan (COZAAR) 100 MG tablet TAKE 1 TABLET EVERY DAY (NEED MD APPOINTMENT) 30 tablet 0   No facility-administered medications prior to visit.    Allergies  Allergen Reactions  . Codeine Anaphylaxis  . Bupropion Other (See Comments)    Anger and mental issues    ROS Review of Systems Review of Systems:  A fourteen system review of systems was performed and found to be positive as per HPI.   Objective:    Physical  Exam General:  Well Developed, well nourished, appropriate for stated age.  Neuro:  Alert and oriented,  extra-ocular muscles intact  HEENT:  Normocephalic, atraumatic, neck supple, no carotid bruits appreciated  Skin:  Papular lesions with erythematous base on temporal sides, nasal lesion with telangiectasia  Cardiac:  RRR, S1 S2 Respiratory:  ECTA B/L, Not using accessory muscles, speaking in full sentences- unlabored. MSK: Small effusion  of right knee, no joint line tenderness, good ROM, negative Anterior and Posterior drawer test, negative Lachman test Vascular:  Ext warm, no cyanosis apprec.; cap RF less 2 sec. Psych:  No HI/SI, judgement and insight good, Euthymic mood. Full Affect.   BP 137/71   Pulse 84   Ht 5\' 2"  (1.575 m)   Wt 161 lb 3.2 oz (73.1 kg)   SpO2 96%   BMI 29.48 kg/m  Wt Readings from Last 3 Encounters:  04/17/20 161 lb 3.2 oz (73.1 kg)  12/26/19 157 lb (71.2 kg)  06/08/19 162 lb (73.5 kg)     Health Maintenance Due  Topic Date Due  . MAMMOGRAM  08/14/2018  . PAP SMEAR-Modifier  01/15/2020    There are no preventive care reminders to display for this patient.  Lab Results  Component Value Date   TSH 1.570 12/30/2018   Lab Results  Component Value Date   WBC 8.6 12/30/2018   HGB 14.1 12/30/2018   HCT 42.2 12/30/2018   MCV 92 12/30/2018   PLT 246 12/30/2018   Lab Results  Component Value Date   NA 141 12/30/2018   K 4.4 12/30/2018   CO2 25 12/30/2018   GLUCOSE 82 12/30/2018   BUN 16 12/30/2018   CREATININE 0.65 12/30/2018   BILITOT 0.2 12/30/2018   ALKPHOS 109 12/30/2018   AST 19 12/30/2018   ALT 15 12/30/2018   PROT 6.0 12/30/2018   ALBUMIN 4.1 12/30/2018   CALCIUM 9.4 12/30/2018   ANIONGAP 6 03/17/2017   Lab Results  Component Value Date   CHOL 155 12/30/2018   Lab Results  Component Value Date   HDL 45 12/30/2018   Lab Results  Component Value Date   LDLCALC 86 12/30/2018   Lab Results  Component Value Date   TRIG 133  12/30/2018   Lab Results  Component Value Date   CHOLHDL 3.4 12/30/2018   Lab Results  Component Value Date   HGBA1C 5.6 12/30/2018      Assessment & Plan:   Problem List Items Addressed This Visit      Cardiovascular and Mediastinum   Hypertension   Relevant Medications   amLODipine (NORVASC) 10 MG tablet   atorvastatin (LIPITOR) 40 MG tablet   losartan (COZAAR) 100 MG tablet     Other   Other hyperlipidemia   Relevant Medications   amLODipine (NORVASC) 10 MG tablet   atorvastatin (LIPITOR) 40 MG tablet   losartan (COZAAR) 100 MG tablet   Restless leg syndrome    Other Visit Diagnoses    Need for influenza vaccination    -  Primary   Relevant Orders   Flu Vaccine QUAD 6+ mos PF IM (Fluarix Quad PF) (Completed)   Skin lesion       Relevant Medications   triamcinolone (KENALOG) 0.1 %   History of CVA (cerebrovascular accident)       Relevant Medications   clopidogrel (PLAVIX) 75 MG tablet   Right knee pain, unspecified chronicity         Hypertension: -Fairly controlled. -Continue current medication regimen. Provided refills. -Continue low-sodium diet and stay well-hydrated. -Advised to schedule lab visit for fasting blood work including CMP for medication monitoring.  Other hyperlipidemia: -Last lipid panel: Total cholesterol 155, triglycerides 133, HDL 45, LDL 86 -Continue current medication regimen. Provided refill. -Follow a diet low in saturated and transfats. -Advised to schedule lab visit for fasting blood work including lipid panel and hepatic function.  History of CVA: -On  Plavix 75 mg. Provided refill  Skin lesion: -Recommend dermatology evaluation for nasal skin lesion and removal. -Will send topical triamcinolone cream for itchy skin lesions.  Right knee pain, unspecified chronicity: -Discussed with patient referral to sports medicine and prefers to monitor symptoms at this time. If symptoms worsen or fail to improve will let me know and will  place referral. -Recommend to continue with conservative management therapy.  Restless leg syndrome: -Stable, unchanged. -Continue current medication regimen. Provided refill. -Recommend to avoid provocative factors such as caffeine or vigorous exercise before bedtime. Encourage local massages or brisk walking regimen.  -Plan to repeat cbc w/ FBW to evaluate for anemia (iron def.).  Meds ordered this encounter  Medications  . triamcinolone (KENALOG) 0.1 %    Sig: Apply 1 application topically 2 (two) times daily.    Dispense:  45 each    Refill:  0    Order Specific Question:   Supervising Provider    Answer:   Beatrice Lecher D [[id number]]  . amLODipine (NORVASC) 10 MG tablet    Sig: Take 1 tablet (10 mg total) by mouth daily.    Dispense:  90 tablet    Refill:  1  . atorvastatin (LIPITOR) 40 MG tablet    Sig: Take 1 tablet (40 mg total) by mouth daily.    Dispense:  90 tablet    Refill:  1  . clopidogrel (PLAVIX) 75 MG tablet    Sig: Take 1 tablet (75 mg total) by mouth daily.    Dispense:  90 tablet    Refill:  1    Requesting 1 year supply  . DULoxetine (CYMBALTA) 30 MG capsule    Sig: Take 2 capsules (60 mg total) by mouth daily.    Dispense:  180 capsule    Refill:  1  . gabapentin (NEURONTIN) 300 MG capsule    Sig: Take 2 capsules (600 mg total) by mouth at bedtime. TAKE 2 CAPSULE BY MOUTH AT  BEDTIME    Dispense:  180 capsule    Refill:  1    Requesting 1 year supply  . losartan (COZAAR) 100 MG tablet    Sig: Take 1 tablet (100 mg total) by mouth daily.    Dispense:  90 tablet    Refill:  1    Follow-up: Return in about 4 months (around 08/16/2020) for HTN, HLD, RLS; lab visit for FBW in 1-3 weeks.   Note:  This note was prepared with assistance of Dragon voice recognition software. Occasional wrong-word or sound-a-like substitutions may have occurred due to the inherent limitations of voice recognition software.  Lorrene Reid, PA-C

## 2020-05-29 ENCOUNTER — Telehealth: Payer: Self-pay | Admitting: Physician Assistant

## 2020-05-29 DIAGNOSIS — L989 Disorder of the skin and subcutaneous tissue, unspecified: Secondary | ICD-10-CM

## 2020-05-29 NOTE — Telephone Encounter (Signed)
Referral originally placed 04/17/20. Derm office attempted to contact patient with no answer and no return call. That referral has been closed.   Placing new referral for skin lesion. Please contact patient and advise they will call her to schedule apt with Derm office. AS, CMA

## 2020-05-29 NOTE — Telephone Encounter (Signed)
Spoke with patient and let her know they will contact her to schedule. 05-29-20.

## 2020-05-29 NOTE — Addendum Note (Signed)
Addended by: Mickel Crow on: 05/29/2020 09:38 AM   Modules accepted: Orders

## 2020-05-29 NOTE — Telephone Encounter (Signed)
Patient would like a referral to a dermatologist for some places on her nose.She has [facility] and would like it to be covered by that.  She was seen here last it looks like in December on the [address]. I see a mention of a skin lesion from that appointment where it was recommended dermatology for removal. Thanks

## 2020-06-18 ENCOUNTER — Other Ambulatory Visit: Payer: Self-pay | Admitting: Physician Assistant

## 2020-06-18 DIAGNOSIS — L989 Disorder of the skin and subcutaneous tissue, unspecified: Secondary | ICD-10-CM

## 2020-07-23 ENCOUNTER — Telehealth: Payer: Self-pay | Admitting: Physician Assistant

## 2020-07-23 MED ORDER — GABAPENTIN 300 MG PO CAPS: 600.0000 mg | ORAL_CAPSULE | Freq: Every day | ORAL | 0 refills | Status: DC

## 2020-07-23 NOTE — Telephone Encounter (Signed)
Please contact patient to schedule appointment per last AVS.   Med refill sent to requested pharmacy. AS, CMA

## 2020-07-23 NOTE — Telephone Encounter (Signed)
Patient scheduled for April 21 and wanted to do follow up and labs at same time.

## 2020-07-23 NOTE — Telephone Encounter (Signed)
Patient is calling for a referral appointment from [employer], PA-C.  Patient is scheduled for 10/25/2020 at 8:45 with [facility], PA-C.

## 2020-07-23 NOTE — Telephone Encounter (Signed)
Referral attached to appointment

## 2020-08-16 ENCOUNTER — Ambulatory Visit (INDEPENDENT_AMBULATORY_CARE_PROVIDER_SITE_OTHER): Payer: Medicare HMO | Admitting: Physician Assistant

## 2020-08-16 ENCOUNTER — Other Ambulatory Visit: Payer: Self-pay

## 2020-08-16 ENCOUNTER — Encounter: Payer: Self-pay | Admitting: Physician Assistant

## 2020-08-16 VITALS — BP 132/77 | HR 75 | Temp 97.8°F | Ht 62.0 in | Wt 161.4 lb

## 2020-08-16 DIAGNOSIS — G8929 Other chronic pain: Secondary | ICD-10-CM | POA: Diagnosis not present

## 2020-08-16 DIAGNOSIS — G2581 Restless legs syndrome: Secondary | ICD-10-CM

## 2020-08-16 DIAGNOSIS — E7849 Other hyperlipidemia: Secondary | ICD-10-CM

## 2020-08-16 DIAGNOSIS — I10 Essential (primary) hypertension: Secondary | ICD-10-CM | POA: Diagnosis not present

## 2020-08-16 DIAGNOSIS — M25561 Pain in right knee: Secondary | ICD-10-CM

## 2020-08-16 DIAGNOSIS — J439 Emphysema, unspecified: Secondary | ICD-10-CM | POA: Diagnosis not present

## 2020-08-16 DIAGNOSIS — Z0000 Encounter for general adult medical examination without abnormal findings: Secondary | ICD-10-CM

## 2020-08-16 NOTE — Patient Instructions (Signed)
Chronic Knee Pain, Adult Chronic knee pain is pain in one or both knees that lasts longer than 3 months. Symptoms of chronic knee pain may include swelling, stiffness, and discomfort. Age-related wear and tear (osteoarthritis) of the knee joint is the most common cause of chronic knee pain. Other possible causes include:  A long-term immune-related disease that causes inflammation of the knee (rheumatoid arthritis). This usually affects both knees.  Inflammatory arthritis, such as gout or pseudogout.  An injury to the knee that causes arthritis.  An injury to the knee that damages the ligaments. Ligaments are strong tissues that connect bones to each other.  Runner's knee or pain behind the kneecap. Treatment for chronic knee pain depends on the cause. The main treatments for chronic knee pain are physical therapy and weight loss. This condition may also be treated with medicines, injections, a knee sleeve or brace, and by using crutches. Rest, ice, pressure (compression), and elevation, also known as RICE therapy, may also be recommended. Follow these instructions at home: If you have a knee sleeve or brace:  Wear the knee sleeve or brace as told by your health care provider. Remove it only as told by your health care provider.  Loosen it if your toes tingle, become numb, or turn cold and blue.  Keep it clean.  If the sleeve or brace is not waterproof: ? Do not let it get wet. ? Remove it if allowed by your health care provider, or cover it with a watertight covering when you take a bath or a shower.   Managing pain, stiffness, and swelling  If directed, apply heat to the affected area as often as told by your health care provider. Use the heat source that your health care provider recommends, such as a moist heat pack or a heating pad. ? If you have a removable knee sleeve or brace, remove it as told by your health care provider. ? Place a towel between your skin and the heat  source. ? Leave the heat on for 20-30 minutes. ? Remove the heat if your skin turns bright red. This is especially important if you are unable to feel pain, heat, or cold. You may have a greater risk of getting burned.  If directed, put ice on the affected area. To do this: ? If you have a removable knee sleeve or brace, remove it as told by your health care provider. ? Put ice in a plastic bag. ? Place a towel between your skin and the bag. ? Leave the ice on for 20 minutes, 2-3 times a day. ? Remove the ice if your skin turns bright red. This is very important. If you cannot feel pain, heat, or cold, you have a greater risk of damage to the area.  Move your toes often to reduce stiffness and swelling.  Raise (elevate) the injured area above the level of your heart while you are sitting or lying down.      Activity  Avoid high-impact activities or exercises, such as running, jumping rope, or doing jumping jacks.  Follow the exercise plan that your health care provider designed for you. Your health care provider may suggest that you: ? Avoid activities that make knee pain worse. This may require you to change your exercise routines, sport participation, or job duties. ? Wear shoes with cushioned soles. ? Avoid sports that require running and sudden changes in direction. ? Do physical therapy. Physical therapy is planned to match your needs and abilities.   It may include exercises for strength, flexibility, stability, and endurance. ? Do exercises that increase balance and strength, such as tai chi and yoga.  Do not use the injured limb to support your body weight until your health care provider says that you can. Use crutches as told by your health care provider.  Return to your normal activities as told by your health care provider. Ask your health care provider what activities are safe for you. General instructions  Take over-the-counter and prescription medicines only as told by  your health care provider.  Lose weight if you are overweight. Losing even a little weight can reduce knee pain. Ask your health care provider what your ideal weight is, and how to safely lose extra weight. A dietitian may be able to help you plan your meals.  Do not use any products that contain nicotine or tobacco, such as cigarettes, e-cigarettes, and chewing tobacco. These can delay healing. If you need help quitting, ask your health care provider.  Keep all follow-up visits. This is important. Contact a health care provider if:  You have knee pain that is not getting better or gets worse.  You are unable to do your physical therapy exercises due to knee pain. Get help right away if:  Your knee swells and the swelling becomes worse.  You cannot move your knee.  You have severe knee pain. Summary  Knee pain that lasts more than 3 months is considered chronic knee pain.  The main treatments for chronic knee pain are physical therapy and weight loss. You may also need to take medicines, wear a knee sleeve or brace, use crutches, and apply ice or heat.  Losing even a little weight can reduce knee pain. Ask your health care provider what your ideal weight is, and how to safely lose extra weight. A dietitian may be able to help you plan your meals.  Follow the exercise plan that your health care provider designed for you. This information is not intended to replace advice given to you by your health care provider. Make sure you discuss any questions you have with your health care provider. Document Revised: 09/28/2019 Document Reviewed: 09/28/2019 Elsevier Patient Education  2021 Elsevier Inc.  

## 2020-08-16 NOTE — Progress Notes (Signed)
Established Patient Office Visit  Subjective:  Patient ID: Heidi Foster, female    DOB: [birth year]  Age: 65 y.o. MRN: [id number]  CC:  Chief Complaint  Patient presents with  . Hypertension    HPI Heidi Foster presents for follow up on hypertension and hyperlipidemia. Patient reports continues to have right knee pain and states this weekend felt like her knee was giving out. Has swelling. Reports exposure to cold temps worsens her pain.  HTN: Pt denies chest pain, palpitations, dizziness or headache. Taking medication as directed without side effects.   HLD: Pt taking medication as directed without issues. Physical activity is limited by knee pain. States is fasting today.  RLS: Reports symptoms are about the same. Is planning to try some CBD gummies. Reports medication compliance.  Past Medical History:  Diagnosis Date  . Allergy   . Anginal pain ([place])   . Arthritis   . Astigmatism   . 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 Closed fracture of right patella   . Emphysema of lung ([place])   . GERD (gastroesophageal reflux disease)   . Heart attack ([place]) 1999  . Hypertension   . Osteopenia 12/2016   T score -1.2 FRAX 7.4%/0.8%  . Seborrheic keratoses   . Stroke ([place])    TIA x 2, early 2000's   . Vocal cord polyp     Past Surgical History:  Procedure Laterality Date  . BREAST BIOPSY    . BREAST SURGERY     Left Breast nodule markers  . CARDIAC CATHETERIZATION     1999  . caridac cath    . LOOP RECORDER INSERTION N/A 03/17/2017   Procedure: LOOP RECORDER INSERTION;  Surgeon: Evans Lance, MD;  Location: [facility];  Service: Cardiovascular;  Laterality: N/A;  . lt. ovary removed    . MICROLARYNGOSCOPY WITH LASER Bilateral 11/13/2016   Procedure: MICROLARYNGOSCOPY;  Surgeon: Melissa Montane, MD;  Location: [facility];  Service: ENT;  Laterality: Bilateral;  . polyp removal  10/2016   throat -   . SKIN BIOPSY     nose  . TEE WITHOUT CARDIOVERSION N/A 03/17/2017   Procedure:  TRANSESOPHAGEAL ECHOCARDIOGRAM (TEE);  Surgeon: Dorothy Spark, MD;  Location: [facility];  Service: Cardiovascular;  Laterality: N/A;  . TUBAL LIGATION      Family History  Problem Relation Age of Onset  . Diabetes Mother   . Hypertension Mother   . Stroke Mother   . Alcohol abuse Father   . Multiple sclerosis Sister   . Cancer Brother        lung  . Stroke Maternal Grandmother   . Heart attack Maternal Grandmother   . Hypertension Maternal Grandmother   . Colon cancer Neg Hx   . Colon polyps Neg Hx   . Esophageal cancer Neg Hx   . Stomach cancer Neg Hx   . Rectal cancer Neg Hx     Social History   Socioeconomic History  . Marital status: Divorced    Spouse name: Not on file  . Number of children: Not on file  . Years of education: Not on file  . Highest education level: Not on file  Occupational History  . Not on file  Tobacco Use  . Smoking status: Former Smoker    Packs/day: 0.50    Years: 40.00    Pack years: 20.00    Types: Cigarettes    Start date: 09/27/1976    Quit date: 12/26/2018  Years since quitting: 1.6  . Smokeless tobacco: Never Used  . Tobacco comment: Peak rate of 1ppd  Vaping Use  . Vaping Use: Some days  Substance and Sexual Activity  . Alcohol use: No  . Drug use: No  . Sexual activity: Never  Other Topics Concern  . Not on file  Social History Narrative   [facility] (06/30/16):   Originally from [place]. Moved to [facility] in April 2016. 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 Currently lives with her daughter & son-in-law in a mobile home. Has a cat currently. No mold or TB exposure. Always had a negative skin PPD. Previously enjoyed crocheting.    Social Determinants of Health   Financial Resource Strain: Not on file  Food Insecurity: Not on file  Transportation Needs: Not on file  Physical Activity: Not on file  Stress: Not on file  Social Connections: Not on file  Intimate Partner Violence: Not on  file    Outpatient Medications Prior to Visit  Medication Sig Dispense Refill  . albuterol (PROVENTIL HFA;VENTOLIN HFA) 108 (90 Base) MCG/ACT inhaler Inhale 2 puffs into the lungs every 4 (four) hours as needed for wheezing or shortness of breath. 1 Inhaler 3  . amLODipine (NORVASC) 10 MG tablet Take 1 tablet (10 mg total) by mouth daily. 90 tablet 1  . atorvastatin (LIPITOR) 40 MG tablet Take 1 tablet (40 mg total) by mouth daily. 90 tablet 1  . clopidogrel (PLAVIX) 75 MG tablet Take 1 tablet (75 mg total) by mouth daily. 90 tablet 1  . DULoxetine (CYMBALTA) 30 MG capsule Take 2 capsules (60 mg total) by mouth daily. 180 capsule 1  . gabapentin (NEURONTIN) 300 MG capsule Take 2 capsules (600 mg total) by mouth at bedtime. TAKE 2 CAPSULE BY MOUTH AT  BEDTIME 180 capsule 0  . losartan (COZAAR) 100 MG tablet Take 1 tablet (100 mg total) by mouth daily. 90 tablet 1  . Multiple Vitamin (MULTIVITAMIN WITH MINERALS) TABS tablet Take 1 tablet daily by mouth.    . Spacer/Aero-Holding Chambers (AEROCHAMBER MV) inhaler Use as instructed 1 each 0  . triamcinolone (KENALOG) 0.1 % APPLY 1 APPLICATION TOPICALLY TO AFFECTED AREA(S) TWICE DAILY 45 g 0  . umeclidinium-vilanterol (ANORO ELLIPTA) 62.5-25 MCG/INH AEPB Inhale 1 puff daily into the lungs.     No facility-administered medications prior to visit.    Allergies  Allergen Reactions  . Codeine Anaphylaxis  . Bupropion Other (See Comments)    Anger and mental issues    ROS Review of Systems A fourteen system review of systems was performed and found to be positive as per HPI.   Objective:    Physical Exam General:  Well Developed, well nourished, appropriate for stated age, using a cane for ambulation  Neuro:  Alert and oriented,  extra-ocular muscles intact  HEENT:  Normocephalic, atraumatic, neck supple, Skin:  no gross rash, warm, pink. Cardiac:  RRR, S1 S2 wl's Respiratory:  ECTA B/L w/o wheezing Not using accessory muscles, speaking  in full sentences- unlabored. MSK: Tenderness to lateral knee, mild effusion noted. No bony abnormality noted. Vascular:  Ext warm, no cyanosis apprec.; cap RF less 2 sec. Psych:  No HI/SI, judgement and insight good, Euthymic mood. Full Affect.   BP 132/77   Pulse 75   Temp 97.8 F (36.6 C)   Ht $R'5\' 2"'hB$  (1.575 m)   Wt 161 lb 6.4 oz (73.2 kg)   SpO2 96%  BMI 29.52 kg/m  Wt Readings from Last 3 Encounters:  08/16/20 161 lb 6.4 oz (73.2 kg)  04/17/20 161 lb 3.2 oz (73.1 kg)  12/26/19 157 lb (71.2 kg)     Health Maintenance Due  Topic Date Due  . MAMMOGRAM  08/14/2018  . PAP SMEAR-Modifier  01/15/2020    There are no preventive care reminders to display for this patient.  Lab Results  Component Value Date   TSH 1.570 12/30/2018   Lab Results  Component Value Date   WBC 8.6 12/30/2018   HGB 14.1 12/30/2018   HCT 42.2 12/30/2018   MCV 92 12/30/2018   PLT 246 12/30/2018   Lab Results  Component Value Date   NA 141 12/30/2018   K 4.4 12/30/2018   CO2 25 12/30/2018   GLUCOSE 82 12/30/2018   BUN 16 12/30/2018   CREATININE 0.65 12/30/2018   BILITOT 0.2 12/30/2018   ALKPHOS 109 12/30/2018   AST 19 12/30/2018   ALT 15 12/30/2018   PROT 6.0 12/30/2018   ALBUMIN 4.1 12/30/2018   CALCIUM 9.4 12/30/2018   ANIONGAP 6 03/17/2017   Lab Results  Component Value Date   CHOL 155 12/30/2018   Lab Results  Component Value Date   HDL 45 12/30/2018   Lab Results  Component Value Date   LDLCALC 86 12/30/2018   Lab Results  Component Value Date   TRIG 133 12/30/2018   Lab Results  Component Value Date   CHOLHDL 3.4 12/30/2018   Lab Results  Component Value Date   HGBA1C 5.6 12/30/2018      Assessment & Plan:   Problem List Items Addressed This Visit      Cardiovascular and Mediastinum   Hypertension - Primary   Relevant Orders   Lipid Profile   CBC w/Diff   HgB A1c     Other   Other hyperlipidemia   Relevant Orders   Lipid Profile   CBC w/Diff    HgB A1c   Chronic pain of right knee   Relevant Orders   Ambulatory referral to Orthopedic Surgery   Health care maintenance   Relevant Orders   Comp Met (CMET)   Lipid Profile   CBC w/Diff   HgB A1c   TSH   Restless leg syndrome     Hypertension: -Fairly controlled. -Continue current medication regimen. -Will collect CMP for medication monitoring. -Will continue to monitor.  Other hyperlipidemia: -Last lipid panel: Total cholesterol 155, triglycerides 133, HDL 45, LDL 86 -Will repeat lipid panel and hepatic function today. -Continue current medication regimen.  Chronic pain of right knee: -Discussed with patient obtaining imaging studies such as knee x-ray and/or referral to orthopedics and prefers to proceed with referral.  Patient has exquisite tenderness to the lateral knee and concerned about potential ligament injury. -Recommend to limit exacerbating activities, apply ice/heat therapy, and use a knee support sleeve/brace.   Healthcare maintenance: -Will collect fasting blood work today. -Discussed with patient pneumococcal and Shingrix vaccines, plans to obtain at the pharmacy. -UTD on Cologuard, Tdap, hep C and HIV screenings.  No orders of the defined types were placed in this encounter.   Follow-up: Return for HTN, HLD, RLS in 4-5 months .   Note:  This note was prepared with assistance of Dragon voice recognition software. Occasional wrong-word or sound-a-like substitutions may have occurred due to the inherent limitations of voice recognition software.  Lorrene Reid, PA-C

## 2020-08-17 LAB — CBC WITH DIFFERENTIAL/PLATELET
Basophils Absolute: 0 10*3/uL (ref 0.0–0.2)
Basos: 0 %
EOS (ABSOLUTE): 0.2 10*3/uL (ref 0.0–0.4)
Eos: 2 %
Hematocrit: 42.3 % (ref 34.0–46.6)
Hemoglobin: 13.6 g/dL (ref 11.1–15.9)
Immature Grans (Abs): 0 10*3/uL (ref 0.0–0.1)
Immature Granulocytes: 0 %
Lymphocytes Absolute: 2.4 10*3/uL (ref 0.7–3.1)
Lymphs: 30 %
MCH: 29.8 pg (ref 26.6–33.0)
MCHC: 32.2 g/dL (ref 31.5–35.7)
MCV: 93 fL (ref 79–97)
Monocytes Absolute: 0.6 10*3/uL (ref 0.1–0.9)
Monocytes: 8 %
Neutrophils Absolute: 4.6 10*3/uL (ref 1.4–7.0)
Neutrophils: 60 %
Platelets: 245 10*3/uL (ref 150–450)
RBC: 4.56 x10E6/uL (ref 3.77–5.28)
RDW: 13.1 % (ref 11.7–15.4)
WBC: 7.8 10*3/uL (ref 3.4–10.8)

## 2020-08-17 LAB — COMPREHENSIVE METABOLIC PANEL
ALT: 17 IU/L (ref 0–32)
AST: 20 IU/L (ref 0–40)
Albumin/Globulin Ratio: 1.9 (ref 1.2–2.2)
Albumin: 4.4 g/dL (ref 3.8–4.8)
Alkaline Phosphatase: 124 IU/L — ABNORMAL HIGH (ref 44–121)
BUN/Creatinine Ratio: 34 — ABNORMAL HIGH (ref 12–28)
BUN: 19 mg/dL (ref 8–27)
Bilirubin Total: <0.2 mg/dL (ref 0.0–1.2)
CO2: 22 mmol/L (ref 20–29)
Calcium: 9 mg/dL (ref 8.7–10.3)
Chloride: 104 mmol/L (ref 96–106)
Creatinine, Ser: 0.56 mg/dL — ABNORMAL LOW (ref 0.57–1.00)
Globulin, Total: 2.3 g/dL (ref 1.5–4.5)
Glucose: 92 mg/dL (ref 65–99)
Potassium: 4.2 mmol/L (ref 3.5–5.2)
Sodium: 145 mmol/L — ABNORMAL HIGH (ref 134–144)
Total Protein: 6.7 g/dL (ref 6.0–8.5)
eGFR: 102 mL/min/{1.73_m2} (ref >59)

## 2020-08-17 LAB — LIPID PANEL
Chol/HDL Ratio: 3 ratio (ref 0.0–4.4)
Cholesterol, Total: 162 mg/dL (ref 100–199)
HDL: 54 mg/dL (ref >39)
LDL Chol Calc (NIH): 96 mg/dL (ref 0–99)
Triglycerides: 61 mg/dL (ref 0–149)
VLDL Cholesterol Cal: 12 mg/dL (ref 5–40)

## 2020-08-17 LAB — HEMOGLOBIN A1C
Est. average glucose Bld gHb Est-mCnc: 120 mg/dL
Hgb A1c MFr Bld: 5.8 % — ABNORMAL HIGH (ref 4.8–5.6)

## 2020-08-17 LAB — TSH: TSH: 1.18 u[IU]/mL (ref 0.450–4.500)

## 2020-08-22 ENCOUNTER — Ambulatory Visit: Payer: Medicare HMO | Admitting: Orthopaedic Surgery

## 2020-08-22 ENCOUNTER — Other Ambulatory Visit: Payer: Self-pay

## 2020-08-22 ENCOUNTER — Ambulatory Visit (INDEPENDENT_AMBULATORY_CARE_PROVIDER_SITE_OTHER): Payer: Medicare HMO

## 2020-08-22 DIAGNOSIS — M25561 Pain in right knee: Secondary | ICD-10-CM | POA: Diagnosis not present

## 2020-08-22 DIAGNOSIS — G8929 Other chronic pain: Secondary | ICD-10-CM

## 2020-08-22 MED ORDER — LIDOCAINE HCL 1 % IJ SOLN
2.0000 mL | INTRAMUSCULAR | Status: AC | PRN
  Administered 2020-08-22: 2 mL

## 2020-08-22 MED ORDER — BUPIVACAINE HCL 0.5 % IJ SOLN
2.0000 mL | INTRAMUSCULAR | Status: AC | PRN
  Administered 2020-08-22: 2 mL via INTRA_ARTICULAR

## 2020-08-22 MED ORDER — METHYLPREDNISOLONE ACETATE 40 MG/ML IJ SUSP
40.0000 mg | INTRAMUSCULAR | Status: AC | PRN
  Administered 2020-08-22: 40 mg via INTRA_ARTICULAR

## 2020-08-22 NOTE — Progress Notes (Signed)
Office Visit Note   Patient: Heidi Foster           Date of Birth: [birth year]           MRN: [id number] Visit Date: 08/22/2020              Requested by: Heidi Reid, PA-C [address],  [place] [address] PCP: Heidi Reid, PA-C   Assessment & Plan: Visit Diagnoses:  1. 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  Based on my assessment I offered repeating the cortisone injection today which she was agreeable to and tolerated well.  We also provided her with a viscosupplementation handout.  We will see her back as needed.  Follow-Up Instructions: Return if symptoms worsen or fail to improve.   Orders:  Orders Placed This Encounter  Procedures  . XR KNEE 3 VIEW RIGHT   No orders of the defined types were placed in this encounter.     Procedures: Large Joint Inj: R knee on 08/22/2020 8:05 PM Indications: pain Details: 22 G needle  Arthrogram: No  Medications: 40 mg methylPREDNISolone acetate 40 MG/ML; 2 mL lidocaine 1 %; 2 mL bupivacaine 0.5 % Consent was given by the patient. Patient was prepped and draped in the usual sterile fashion.       Clinical Data: No additional findings.   Subjective: Chief Complaint  Patient presents with  . Right Knee - Pain    Heidi Foster is a 65 year old female who comes in for evaluation of right knee pain for about 3 years.  We saw her in 2019 for right knee pain which we felt was due to either lateral meniscus tear or DJD.  She states that that injection helped for couple years but recently has gotten worse.  She is experiencing symptoms of giving way and pain is 7 out of 10.  She is a retired Quarry manager.  She has been using Voltaren gel on the knee on a daily basis.  She endorses pain on both sides of the knee and in the back.  Denies any mechanical symptoms.   Review of Systems  Constitutional: Negative.   HENT: Negative.   Eyes: Negative.    Respiratory: Negative.   Cardiovascular: Negative.   Endocrine: Negative.   Musculoskeletal: Negative.   Neurological: Negative.   Hematological: Negative.   Psychiatric/Behavioral: Negative.   All other systems reviewed and are negative.    Objective: Vital Signs: There were no vitals taken for this visit.  Physical Exam Vitals and nursing note reviewed.  Constitutional:      Appearance: She is well-developed.  Pulmonary:     Effort: Pulmonary effort is normal.  Skin:    General: Skin is warm.     Capillary Refill: Capillary refill takes less than 2 seconds.  Neurological:     Mental Status: She is alert and oriented to person, place, and time.  Psychiatric:        Behavior: Behavior normal.        Thought Content: Thought content normal.        Judgment: Judgment normal.     Ortho Exam Right knee shows no joint effusion.  Mild patellofemoral crepitus with range of motion which is mildly restricted.  Collaterals and cruciates are stable.  No joint line tenderness. Specialty Comments:  No specialty comments available.  Imaging: XR KNEE 3 VIEW RIGHT  Result Date: 08/22/2020 Moderate degenerative changes  to the right knee.    PMFS History: Patient Active Problem List   Diagnosis Date Noted  . Stress at home 06/08/2019  . Animal bite of finger 10/07/2018  . Acute pain of right knee 11/09/2017  . Knee swelling 11/09/2017  . Restless leg syndrome 07/28/2017  . Low serum creatine 06/30/2017  . Acute bacterial conjunctivitis of left eye 06/30/2017  . Insomnia 04/01/2017  . Cerebral aneurysm   . Stroke ([place]) 03/15/2017  . Paresthesia of both hands 03/09/2017  . Hx of transient ischemic attack (TIA) 03/09/2017  . Fibromyalgia 01/22/2017  . Breast pain, left 12/15/2016  . Generalized weakness 12/15/2016  . Leg pain, bilateral 12/15/2016  . 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 Hoarseness 10/08/2016  . Laryngopharyngeal reflux (LPR) 10/08/2016  . Centrilobular  emphysema ([place]) 09/09/2016  . GERD (gastroesophageal reflux disease) 06/30/2016  . Dysphagia 06/30/2016  . Health care maintenance 06/25/2016  . Other fatigue 05/26/2016  . Hypertension 05/26/2016  . Other hyperlipidemia 05/26/2016  . Family history of diabetes mellitus in mother 05/26/2016  . Acute pain of right shoulder 05/26/2016  . Tobacco use disorder 05/26/2016  . Dyspnea on exertion 05/26/2016  . Chronic pain of right knee 05/26/2016  . History of MI (myocardial infarction) 05/26/2016  . History of TIA (transient ischemic attack) 05/26/2016  . Numbness and tingling of left lower extremity 05/26/2016  . Numbness and tingling in left hand 05/26/2016  . 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 Allergy   . Anginal pain ([place])   . Arthritis   . Astigmatism   . Cancer ([place])    skin on nose  . Closed fracture of right patella   . Emphysema of lung ([place])   . GERD (gastroesophageal reflux disease)   . Heart attack ([place]) 1999  . Hypertension   . Osteopenia 12/2016   T score -1.2 FRAX 7.4%/0.8%  . Seborrheic keratoses   . Stroke ([place])    TIA x 2, early 2000's   . Vocal cord polyp     Family History  Problem Relation Age of Onset  . Diabetes Mother   . Hypertension Mother   . Stroke Mother   . Alcohol abuse Father   . Multiple sclerosis Sister   . Cancer Brother        lung  . Stroke Maternal Grandmother   . Heart attack Maternal Grandmother   . Hypertension Maternal Grandmother   . Colon cancer Neg Hx   . Colon polyps Neg Hx   . Esophageal cancer Neg Hx   . Stomach cancer Neg Hx   . Rectal cancer Neg Hx     Past Surgical History:  Procedure Laterality Date  . BREAST BIOPSY    . BREAST SURGERY     Left Breast nodule markers  . CARDIAC CATHETERIZATION     1999  . caridac cath    . LOOP RECORDER INSERTION N/A 03/17/2017   Procedure: LOOP RECORDER INSERTION;  Surgeon: Heidi Lance, MD;  Location: [facility];  Service:  Cardiovascular;  Laterality: N/A;  . lt. ovary removed    . MICROLARYNGOSCOPY WITH LASER Bilateral 11/13/2016   Procedure: MICROLARYNGOSCOPY;  Surgeon: Heidi Montane, MD;  Location: [facility];  Service: ENT;  Laterality: Bilateral;  . polyp removal  10/2016   throat -   . SKIN BIOPSY     nose  . TEE WITHOUT CARDIOVERSION N/A 03/17/2017   Procedure: TRANSESOPHAGEAL ECHOCARDIOGRAM (TEE);  Surgeon:  Dorothy Spark, MD;  Location: [facility];  Service: Cardiovascular;  Laterality: N/A;  . TUBAL LIGATION     Social History   Occupational History  . Not on file  Tobacco Use  . Smoking status: Former Smoker    Packs/day: 0.50    Years: 40.00    Pack years: 20.00    Types: Cigarettes    Start date: 09/27/1976    Quit date: 12/26/2018    Years since quitting: 1.6  . Smokeless tobacco: Never Used  . Tobacco comment: Peak rate of 1ppd  Vaping Use  . Vaping Use: Some days  Substance and Sexual Activity  . Alcohol use: No  . Drug use: No  . Sexual activity: Never

## 2020-09-03 ENCOUNTER — Other Ambulatory Visit: Payer: Self-pay | Admitting: Physician Assistant

## 2020-09-19 ENCOUNTER — Telehealth: Payer: Self-pay | Admitting: Physician Assistant

## 2020-09-19 DIAGNOSIS — Z1231 Encounter for screening mammogram for malignant neoplasm of breast: Secondary | ICD-10-CM

## 2020-09-19 NOTE — Telephone Encounter (Signed)
Contacted patient to advise she is past due for Mammogram and offer to schedule her for June 8th when Mammo bus will be at our office. Patient agreeable. AS, CMA

## 2020-09-20 ENCOUNTER — Telehealth: Payer: Self-pay | Admitting: Physician Assistant

## 2020-09-20 NOTE — Telephone Encounter (Signed)
Contacted patient and offered appointment at 330pm today and 930 am tomorrow. Pt declined both appointments. Pt advised she would need to be seen for treatment. Pt states she will go to [facility] when its convenient for her. AS, CMA

## 2020-09-20 NOTE — Telephone Encounter (Signed)
Patient has two infected teeth, one is broken down to almost nothing, and other is barely hanging on. Patient has not been eating due to the sticking of the teeth. Patient has been gargling with mouthwash and saltwater and patients left check is swollen. Please advise, thanks

## 2020-10-01 ENCOUNTER — Other Ambulatory Visit: Payer: Self-pay | Admitting: Physician Assistant

## 2020-10-01 DIAGNOSIS — Z8673 Personal history of transient ischemic attack (TIA), and cerebral infarction without residual deficits: Secondary | ICD-10-CM

## 2020-10-01 DIAGNOSIS — E7849 Other hyperlipidemia: Secondary | ICD-10-CM

## 2020-10-02 NOTE — Telephone Encounter (Signed)
Can you reach out to the patient to schedule her follow up appt per her last AVS?

## 2020-10-03 NOTE — Telephone Encounter (Signed)
Patient scheduled.

## 2020-10-05 ENCOUNTER — Encounter: Payer: Self-pay | Admitting: Physician Assistant

## 2020-10-05 ENCOUNTER — Ambulatory Visit (INDEPENDENT_AMBULATORY_CARE_PROVIDER_SITE_OTHER): Payer: Medicare HMO | Admitting: Physician Assistant

## 2020-10-05 ENCOUNTER — Other Ambulatory Visit: Payer: Self-pay

## 2020-10-05 VITALS — BP 118/72 | HR 72 | Temp 98.4°F | Ht 62.0 in | Wt 157.0 lb

## 2020-10-05 DIAGNOSIS — R7303 Prediabetes: Secondary | ICD-10-CM | POA: Diagnosis not present

## 2020-10-05 DIAGNOSIS — I10 Essential (primary) hypertension: Secondary | ICD-10-CM

## 2020-10-05 DIAGNOSIS — E7849 Other hyperlipidemia: Secondary | ICD-10-CM | POA: Diagnosis not present

## 2020-10-05 DIAGNOSIS — G2581 Restless legs syndrome: Secondary | ICD-10-CM

## 2020-10-05 NOTE — Progress Notes (Signed)
Established Patient Office Visit  Subjective:  Patient ID: Heidi Foster, female    DOB: [birth year]  Age: 65 y.o. MRN: [id number]  CC:  Chief Complaint  Patient presents with   Follow-up   Hypertension   Hyperlipidemia    HPI Heidi Foster presents for follow up on hypertension, hyperlipidemia and restless leg syndrome. Has no acute concerns.  HTN: Pt denies chest pain, palpitations, dizziness or lower extremity swelling. Taking medication as directed without side effects. Checks BP at home and readings range <130/85. Pt follows a low salt diet. States has been trying to do better with increasing water intake.   HLD: Pt taking medication as directed without issues. Patient has been able to be a little more active since getting a cortisone shot in her right knee.   RLS: Reports symptoms are about the same. Reports medication compliance.   Prediabetes: Denies increased thirst or urination. States has tried to reduce sugar intake.   Past Medical History:  Diagnosis Date   Allergy    Anginal pain ([place])    Arthritis    Astigmatism    Cancer ([place])    skin on nose   Closed fracture of right patella    Emphysema of lung ([facility])    GERD (gastroesophageal reflux disease)    Heart attack ([place]) 1999   Hypertension    Osteopenia 12/2016   T score -1.2 FRAX 7.4%/0.8%   Seborrheic keratoses    Stroke ([facility])    TIA x 2, early 2000's    Vocal cord polyp     Past Surgical History:  Procedure Laterality Date   BREAST BIOPSY     BREAST SURGERY     Left Breast nodule markers   CARDIAC CATHETERIZATION     1999   caridac cath     LOOP RECORDER INSERTION N/A 03/17/2017   Procedure: LOOP RECORDER INSERTION;  Surgeon: Evans Lance, MD;  Location: [facility];  Service: Cardiovascular;  Laterality: N/A;   lt. ovary removed     MICROLARYNGOSCOPY WITH LASER Bilateral 11/13/2016   Procedure: MICROLARYNGOSCOPY;  Surgeon: Melissa Montane, MD;  Location: [facility];  Service: ENT;  Laterality:  Bilateral;   polyp removal  10/2016   throat -    SKIN BIOPSY     nose   TEE WITHOUT CARDIOVERSION N/A 03/17/2017   Procedure: TRANSESOPHAGEAL ECHOCARDIOGRAM (TEE);  Surgeon: Dorothy Spark, MD;  Location: [facility];  Service: Cardiovascular;  Laterality: N/A;   TUBAL LIGATION      Family History  Problem Relation Age of Onset   Diabetes Mother    Hypertension Mother    Stroke Mother    Alcohol abuse Father    Multiple sclerosis Sister    Cancer Brother        lung   Stroke Maternal Grandmother    Heart attack Maternal Grandmother    Hypertension Maternal Grandmother    Colon cancer Neg Hx    Colon polyps Neg Hx    Esophageal cancer Neg Hx    Stomach cancer Neg Hx    Rectal cancer Neg Hx     Social History   Socioeconomic History   Marital status: Divorced    Spouse name: Not on file   Number of children: Not on file   Years of education: Not on file   Highest education level: Not on file  Occupational History   Not on file  Tobacco Use   Smoking status: Former    Packs/day: 0.50  Years: 40.00    Pack years: 20.00    Types: Cigarettes    Start date: 09/27/1976    Quit date: 12/26/2018    Years since quitting: 1.7   Smokeless tobacco: Never   Tobacco comments:    Peak rate of 1ppd  Vaping Use   Vaping Use: Some days  Substance and Sexual Activity   Alcohol use: No   Drug use: No   Sexual activity: Never  Other Topics Concern   Not on file  Social History Narrative    Pulmonary (06/30/16):   Originally from [place]. Moved to [facility] in April 2016. 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 Currently lives with her daughter & son-in-law in a mobile home. Has a cat currently. No mold or TB exposure. Always had a negative skin PPD. Previously enjoyed crocheting.    Social Determinants of Health   Financial Resource Strain: Not on file  Food Insecurity: Not on file  Transportation Needs: Not on file  Physical Activity: Not on  file  Stress: Not on file  Social Connections: Not on file  Intimate Partner Violence: Not on file    Outpatient Medications Prior to Visit  Medication Sig Dispense Refill   albuterol (PROVENTIL HFA;VENTOLIN HFA) 108 (90 Base) MCG/ACT inhaler Inhale 2 puffs into the lungs every 4 (four) hours as needed for wheezing or shortness of breath. 1 Inhaler 3   amLODipine (NORVASC) 10 MG tablet Take 1 tablet (10 mg total) by mouth daily. 90 tablet 1   atorvastatin (LIPITOR) 40 MG tablet TAKE 1 TABLET EVERY DAY 90 tablet 0   clopidogrel (PLAVIX) 75 MG tablet TAKE 1 TABLET EVERY DAY 90 tablet 0   DULoxetine (CYMBALTA) 30 MG capsule TAKE 2 CAPSULES EVERY DAY 180 capsule 0   gabapentin (NEURONTIN) 300 MG capsule Take 2 capsules (600 mg total) by mouth at bedtime. TAKE 2 CAPSULE BY MOUTH AT  BEDTIME 180 capsule 0   losartan (COZAAR) 100 MG tablet TAKE 1 TABLET EVERY DAY 90 tablet 0   Multiple Vitamin (MULTIVITAMIN WITH MINERALS) TABS tablet Take 1 tablet daily by mouth.     Spacer/Aero-Holding Chambers (AEROCHAMBER MV) inhaler Use as instructed 1 each 0   triamcinolone (KENALOG) 0.1 % APPLY 1 APPLICATION TOPICALLY TO AFFECTED AREA(S) TWICE DAILY 45 g 0   umeclidinium-vilanterol (ANORO ELLIPTA) 62.5-25 MCG/INH AEPB Inhale 1 puff daily into the lungs.     No facility-administered medications prior to visit.    Allergies  Allergen Reactions   Codeine Anaphylaxis   Bupropion Other (See Comments)    Anger and mental issues    ROS Review of Systems A fourteen system review of systems was performed and found to be positive as per HPI.    Objective:    Physical Exam General:  Well Developed, well nourished, in no acute distress  Neuro:  Alert and oriented,  extra-ocular muscles intact  HEENT:  Normocephalic, atraumatic, neck supple Skin:  no gross rash, warm, pink. Cardiac:  RRR Respiratory:  ECTA B/L w/o wheezing, slight dec breath sounds at lung bases, Not using accessory muscles, speaking in  full sentences- unlabored. Vascular:  Ext warm, no cyanosis apprec.; cap RF less 2 sec. Psych:  No HI/SI, judgement and insight good, Euthymic mood. Full Affect.  BP 118/72   Pulse 72   Temp 98.4 F (36.9 C)   Ht _0  (1.575 m)   Wt 157 lb 0.1 oz (71.2 kg)   SpO2  96%   BMI 28.72 kg/m  Wt Readings from Last 3 Encounters:  10/05/20 157 lb 0.1 oz (71.2 kg)  08/16/20 161 lb 6.4 oz (73.2 kg)  04/17/20 161 lb 3.2 oz (73.1 kg)     Health Maintenance Due  Topic Date Due   Zoster Vaccines- Shingrix (1 of 2) Never done   MAMMOGRAM  08/14/2018   PAP SMEAR-Modifier  01/15/2020   COVID-19 Vaccine (4 - Booster for Pfizer series) 08/01/2020    There are no preventive care reminders to display for this patient.  Lab Results  Component Value Date   TSH 1.180 08/16/2020   Lab Results  Component Value Date   WBC 7.8 08/16/2020   HGB 13.6 08/16/2020   HCT 42.3 08/16/2020   MCV 93 08/16/2020   PLT 245 08/16/2020   Lab Results  Component Value Date   NA 145 (H) 08/16/2020   K 4.2 08/16/2020   CO2 22 08/16/2020   GLUCOSE 92 08/16/2020   BUN 19 08/16/2020   CREATININE 0.56 (L) 08/16/2020   BILITOT <0.2 08/16/2020   ALKPHOS 124 (H) 08/16/2020   AST 20 08/16/2020   ALT 17 08/16/2020   PROT 6.7 08/16/2020   ALBUMIN 4.4 08/16/2020   CALCIUM 9.0 08/16/2020   ANIONGAP 6 03/17/2017   EGFR 102 08/16/2020   Lab Results  Component Value Date   CHOL 162 08/16/2020   Lab Results  Component Value Date   HDL 54 08/16/2020   Lab Results  Component Value Date   LDLCALC 96 08/16/2020   Lab Results  Component Value Date   TRIG 61 08/16/2020   Lab Results  Component Value Date   CHOLHDL 3.0 08/16/2020   Lab Results  Component Value Date   HGBA1C 5.8 (H) 08/16/2020      Assessment & Plan:   Problem List Items Addressed This Visit       Cardiovascular and Mediastinum   Hypertension    -Controlled. -Continue current medication regimen. -Recommend to stay well  hydrated. -Will continue to monitor.         Other   Other hyperlipidemia    -Last lipid panel wnl's, LDL 96 (goal <70). -Continue current medication regimen.  -Will continue to monitor. Patient deferred obtaining labs until next visit.       Restless leg syndrome - Primary    -Stable. -Continue current medication regimen. -Will continue to monitor.       Other Visit Diagnoses     Prediabetes          Prediabetes: -Last A1c 5.8, recommend to reduce simple carbohydrates and sugar intake. Continue to stay as active as possible. -Will continue to monitor. Will repeat A1c at follow up visit.  No orders of the defined types were placed in this encounter.   Follow-up: Return in about 4 months (around 02/04/2021) for HTN, HLD, RLS, prediabetes and FBW.   Note:  This note was prepared with assistance of Dragon voice recognition software. Occasional wrong-word or sound-a-like substitutions may have occurred due to the inherent limitations of voice recognition software.   Lorrene Reid, PA-C

## 2020-10-05 NOTE — Assessment & Plan Note (Signed)
-  Last lipid panel wnl's, LDL 96 (goal <70). -Continue current medication regimen.  -Will continue to monitor. Patient deferred obtaining labs until next visit.

## 2020-10-05 NOTE — Assessment & Plan Note (Signed)
-  Stable. -Continue current medication regimen.  -Will continue to monitor. 

## 2020-10-05 NOTE — Assessment & Plan Note (Signed)
-  Controlled. -Continue current medication regimen. -Recommend to stay well hydrated. -Will continue to monitor.

## 2020-10-15 ENCOUNTER — Other Ambulatory Visit: Payer: Self-pay | Admitting: Physician Assistant

## 2020-10-15 DIAGNOSIS — I10 Essential (primary) hypertension: Secondary | ICD-10-CM

## 2020-10-22 ENCOUNTER — Encounter: Payer: Self-pay | Admitting: Physician Assistant

## 2020-10-22 ENCOUNTER — Other Ambulatory Visit: Payer: Self-pay

## 2020-10-22 ENCOUNTER — Ambulatory Visit (INDEPENDENT_AMBULATORY_CARE_PROVIDER_SITE_OTHER): Payer: Medicare HMO | Admitting: Physician Assistant

## 2020-10-22 DIAGNOSIS — L570 Actinic keratosis: Secondary | ICD-10-CM

## 2020-10-22 DIAGNOSIS — C44311 Basal cell carcinoma of skin of nose: Secondary | ICD-10-CM | POA: Diagnosis not present

## 2020-10-22 DIAGNOSIS — Z1283 Encounter for screening for malignant neoplasm of skin: Secondary | ICD-10-CM | POA: Diagnosis not present

## 2020-10-22 DIAGNOSIS — L820 Inflamed seborrheic keratosis: Secondary | ICD-10-CM

## 2020-10-22 DIAGNOSIS — D485 Neoplasm of uncertain behavior of skin: Secondary | ICD-10-CM

## 2020-10-22 NOTE — Progress Notes (Signed)
   New Patient   Subjective  Heidi Foster is a 65 y.o. female who presents for the following: Annual Exam (Left post leg isk?, both temples scale,  left nose pink per pcp DR.).   The following portions of the chart were reviewed this encounter and updated as appropriate:  Tobacco  Allergies  Meds  Problems  Med Hx  Surg Hx  Fam Hx       Objective  Well appearing patient in no apparent distress; mood and affect are within normal limits.  A full examination was performed including scalp, head, eyes, ears, nose, lips, neck, chest, axillae, abdomen, back, buttocks, bilateral upper extremities, bilateral lower extremities, hands, feet, fingers, toes, fingernails, and toenails. All findings within normal limits unless otherwise noted below.  Left Nasal Sidewall Pearly papule with telangectasia.        Left Lower Leg - Posterior Erythematous stuck-on, waxy papule or plaque. Erythematous stuck-on, waxy papule or plaque.   Left Nasal Sidewall, Mid Forehead, Right Forehead, Right Temple Erythematous patches with gritty scale.   Assessment & Plan  Neoplasm of uncertain behavior of skin Left Nasal Sidewall  Skin / nail biopsy Type of biopsy: tangential   Informed consent: discussed and consent obtained   Timeout: patient name, date of birth, surgical site, and procedure verified   Procedure prep:  Patient was prepped and draped in usual sterile fashion (Non sterile) Prep type:  Chlorhexidine Anesthesia: the lesion was anesthetized in a standard fashion   Anesthetic:  1% lidocaine w/ epinephrine 1-100,000 local infiltration Instrument used: flexible razor blade   Outcome: patient tolerated procedure well   Post-procedure details: wound care instructions given    Specimen 1 - Surgical pathology Differential Diagnosis: R/O BCC vs SCC MOH's if positive Check Margins: No  Inflamed seborrheic keratosis Left Lower Leg - Posterior  Destruction of lesion - Left Lower Leg -  Posterior Complexity: simple   Destruction method: cryotherapy   Informed consent: discussed and consent obtained   Timeout:  patient name, date of birth, surgical site, and procedure verified Lesion destroyed using liquid nitrogen: Yes   Cryotherapy cycles:  1 Outcome: patient tolerated procedure well with no complications   Post-procedure details: wound care instructions given    AK (actinic keratosis) (4) Mid Forehead; Right Temple; Left Nasal Sidewall; Right Forehead  Return in the winter to discuss topical therapy.  Destruction of lesion - Left Nasal Sidewall, Mid Forehead, Right Forehead, Right Temple Complexity: simple   Destruction method: cryotherapy   Informed consent: discussed and consent obtained   Timeout:  patient name, date of birth, surgical site, and procedure verified Lesion destroyed using liquid nitrogen: Yes   Cryotherapy cycles:  3 Outcome: patient tolerated procedure well with no complications   Post-procedure details: wound care instructions given       I, Renita Brocks, PA-C, have reviewed all documentation's for this visit.  The documentation on 10/22/20 for the exam, diagnosis, procedures and orders are all accurate and complete.

## 2020-10-22 NOTE — Patient Instructions (Signed)

## 2020-10-24 ENCOUNTER — Other Ambulatory Visit: Payer: Self-pay | Admitting: Physician Assistant

## 2020-10-24 MED ORDER — GABAPENTIN 300 MG PO CAPS: 600.0000 mg | ORAL_CAPSULE | Freq: Every day | ORAL | 0 refills | Status: DC

## 2020-10-25 ENCOUNTER — Ambulatory Visit: Payer: Medicare HMO | Admitting: Physician Assistant

## 2020-11-06 ENCOUNTER — Telehealth: Payer: Self-pay

## 2020-11-06 NOTE — Telephone Encounter (Signed)
Phone call to patient with her pathology results. Patient aware, MOH's info sent to [facility].

## 2020-11-06 NOTE — Telephone Encounter (Signed)
-----   Message from Warren Danes, [place] sent at 11/06/2020  8:00 AM EDT ----- Mohs please. ----- Message ----- From: Interface, [facility] Sent: 10/26/2020   6:58 PM EDT To: Warren Danes, PA-C

## 2021-01-15 ENCOUNTER — Telehealth: Payer: Self-pay | Admitting: Physician Assistant

## 2021-01-15 NOTE — Telephone Encounter (Signed)
Called pt and to scheduled AWV and she stated that she had one scheduled for October.

## 2021-01-17 ENCOUNTER — Telehealth: Payer: Self-pay | Admitting: Physician Assistant

## 2021-01-17 NOTE — Telephone Encounter (Signed)
LVM for pt to rtn my call to schedule AWV with [facility]. Please schedule this appt with [facility] if pt calls the office.

## 2021-01-21 DIAGNOSIS — C44311 Basal cell carcinoma of skin of nose: Secondary | ICD-10-CM | POA: Diagnosis not present

## 2021-01-30 ENCOUNTER — Other Ambulatory Visit: Payer: Self-pay | Admitting: Physician Assistant

## 2021-02-05 ENCOUNTER — Other Ambulatory Visit: Payer: Self-pay

## 2021-02-05 ENCOUNTER — Encounter: Payer: Self-pay | Admitting: Physician Assistant

## 2021-02-05 ENCOUNTER — Ambulatory Visit (INDEPENDENT_AMBULATORY_CARE_PROVIDER_SITE_OTHER): Payer: Medicare HMO | Admitting: Physician Assistant

## 2021-02-05 VITALS — BP 117/74 | HR 73 | Temp 98.0°F | Ht 62.0 in | Wt 140.4 lb

## 2021-02-05 DIAGNOSIS — E7849 Other hyperlipidemia: Secondary | ICD-10-CM | POA: Diagnosis not present

## 2021-02-05 DIAGNOSIS — F419 Anxiety disorder, unspecified: Secondary | ICD-10-CM | POA: Diagnosis not present

## 2021-02-05 DIAGNOSIS — G2581 Restless legs syndrome: Secondary | ICD-10-CM

## 2021-02-05 DIAGNOSIS — F321 Major depressive disorder, single episode, moderate: Secondary | ICD-10-CM | POA: Diagnosis not present

## 2021-02-05 DIAGNOSIS — F439 Reaction to severe stress, unspecified: Secondary | ICD-10-CM

## 2021-02-05 DIAGNOSIS — I10 Essential (primary) hypertension: Secondary | ICD-10-CM | POA: Diagnosis not present

## 2021-02-05 DIAGNOSIS — R7303 Prediabetes: Secondary | ICD-10-CM

## 2021-02-05 DIAGNOSIS — T7631XA Adult psychological abuse, suspected, initial encounter: Secondary | ICD-10-CM

## 2021-02-05 MED ORDER — DULOXETINE HCL 30 MG PO CPEP: 90.0000 mg | ORAL_CAPSULE | Freq: Every day | ORAL | 0 refills | Status: DC

## 2021-02-05 NOTE — Patient Instructions (Signed)
Restless Legs Syndrome Restless legs syndrome is a condition that causes uncomfortable feelings or sensations in the legs, especially while sitting or lying down. The sensations usually cause an overwhelming urge to move the legs. The arms can alsosometimes be affected. The condition can range from mild to severe. The symptoms often interfere witha person's ability to sleep. What are the causes? The cause of this condition is not known. What increases the risk? The following factors may make you more likely to develop this condition: Being older than 50. Pregnancy. Being a woman. In general, the condition is more common in women than in men. A family history of the condition. Having iron deficiency. Overuse of caffeine, nicotine, or alcohol. Certain medical conditions, such as kidney disease, Parkinson's disease, or nerve damage. Certain medicines, such as those for high blood pressure, nausea, colds, allergies, depression, and some heart conditions. What are the signs or symptoms? The main symptom of this condition is uncomfortable sensations in the legs, such as: Pulling. Tingling. Prickling. Throbbing. Crawling. Burning. Usually, the sensations: Affect both sides of the body. Are worse when you sit or lie down. Are worse at night. These may wake you up or make it difficult to fall asleep. Make you have a strong urge to move your legs. Are temporarily relieved by moving your legs. The arms can also be affected, but this is rare. People who have this conditionoften have tiredness during the day because of their lack of sleep at night. How is this diagnosed? This condition may be diagnosed based on: Your symptoms. Blood tests. In some cases, you may be monitored in a sleep lab by a specialist (a sleep study). This can detect any disruptions in your sleep. How is this treated? This condition is treated by managing the symptoms. This may include: Lifestyle changes, such as  exercising, using relaxation techniques, and avoiding caffeine, alcohol, or tobacco. Medicines. Anti-seizure medicines may be tried first. Follow these instructions at home: General instructions Take over-the-counter and prescription medicines only as told by your health care provider. Use methods to help relieve the uncomfortable sensations, such as: Massaging your legs. Walking or stretching. Taking a cold or hot bath. Keep all follow-up visits as told by your health care provider. This is important. Lifestyle     Practice good sleep habits. For example, go to bed and get up at the same time every day. Most adults should get 7-9 hours of sleep each night. Exercise regularly. Try to get at least 30 minutes of exercise most days of the week. Practice ways of relaxing, such as yoga or meditation. Avoid caffeine and alcohol. Do not use any products that contain nicotine or tobacco, such as cigarettes and e-cigarettes. If you need help quitting, ask your health care provider. Contact a health care provider if: Your symptoms get worse or they do not improve with treatment. Summary Restless legs syndrome is a condition that causes uncomfortable feelings or sensations in the legs, especially while sitting or lying down. The symptoms often interfere with a person's ability to sleep. This condition is treated by managing the symptoms. You may need to make lifestyle changes or take medicines. This information is not intended to replace advice given to you by your health care provider. Make sure you discuss any questions you have with your healthcare provider. Document Revised: 06/03/2019 Document Reviewed: 05/04/2017 Elsevier Patient Education  2022 [employer]  
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
## 2021-02-25 ENCOUNTER — Other Ambulatory Visit: Payer: Self-pay | Admitting: Physician Assistant

## 2021-02-25 DIAGNOSIS — E7849 Other hyperlipidemia: Secondary | ICD-10-CM

## 2021-02-25 DIAGNOSIS — Z8673 Personal history of transient ischemic attack (TIA), and cerebral infarction without residual deficits: Secondary | ICD-10-CM

## 2021-03-05 ENCOUNTER — Encounter: Payer: Self-pay | Admitting: Physician Assistant

## 2021-03-05 ENCOUNTER — Ambulatory Visit (INDEPENDENT_AMBULATORY_CARE_PROVIDER_SITE_OTHER): Payer: Medicare HMO | Admitting: Physician Assistant

## 2021-03-05 VITALS — BP 111/76 | HR 80 | Ht 61.0 in | Wt 140.0 lb

## 2021-03-05 DIAGNOSIS — G2581 Restless legs syndrome: Secondary | ICD-10-CM | POA: Diagnosis not present

## 2021-03-05 DIAGNOSIS — Z20828 Contact with and (suspected) exposure to other viral communicable diseases: Secondary | ICD-10-CM

## 2021-03-05 DIAGNOSIS — R051 Acute cough: Secondary | ICD-10-CM

## 2021-03-05 MED ORDER — BENZONATATE 100 MG PO CAPS: 100.0000 mg | ORAL_CAPSULE | Freq: Three times a day (TID) | ORAL | 0 refills | Status: DC | PRN

## 2021-03-05 MED ORDER — GABAPENTIN 300 MG PO CAPS: 600.0000 mg | ORAL_CAPSULE | Freq: Every day | ORAL | 1 refills | Status: DC

## 2021-03-05 MED ORDER — PREDNISONE 20 MG PO TABS: 40.0000 mg | ORAL_TABLET | Freq: Every day | ORAL | 0 refills | Status: AC

## 2021-03-05 NOTE — Patient Instructions (Signed)
Respiratory Syncytial Virus Infection, Adult Respiratory syncytial virus (RSV) infection is an infection caused by RSV, a common virus. This virus is similar to viruses that cause the common cold and the flu. RSV infection can affect the nose, throat, windpipe, and lungs (respiratory system). When the infection is severe, it can cause: Bronchiolitis. This condition causes inflammation of the air passages in the lungs (bronchioles). Pneumonia. This condition causes inflammation of the air sacs in the lungs. RSV infection spreads from person to person (is contagious) through droplets from coughs and sneezes (respiratory secretions). This condition is rarely serious when it occurs in adults. What are the causes? This condition is caused by contact with RSV. This can happen by: Breathing respiratory secretions from someone who has the infection. Touching something that has been exposed to the virus (is contaminated) and then touching your mouth, nose, or eyes. Coming in close contact with someone who has this infection. This may happen if you: Hug or kiss. Shake or hold hands. Eat or drink using the same dishes or utensils. What increases the risk? The following factors may make you more likely to develop this condition: Being 65 years of age or older. Having certain health conditions, including: A long-term (chronic) lung condition, such as chronic obstructive pulmonary disease (COPD). An immune system that is weak. This is your body's defense system. Down syndrome. Heart disease. Working in a hospital or other health care facility. Living in a long-term health care facility. RSV infections are most common from the months of November to April, but they can happen any time of year. What are the signs or symptoms? Symptoms of this condition include: Having a runny nose. Coughing. You may have a cough that brings up mucus (productive cough). Sneezing. Having a fever. Wanting to eat less than  usual. Breathing loudly (wheezing). Having shortness of breath. Having fluid build up in the lungs (respiratory distress). How is this diagnosed? This condition may be diagnosed based on: Your symptoms. Your medical history. A physical exam. A chest X-ray to rule out pneumonia. Blood tests or tests of mucus from your lungs (sputum). These tests may be done for older adults. A test of a sample of your respiratory secretions. How is this treated? In most cases, the RSV infection will go away after 1-2 weeks of caring for yourself at home.  Sometimes, RSV infection is severe and can cause bronchiolitis or pneumonia. If you develop one or both of these conditions, you may need to be treated in the hospital. You may be given: Oxygen therapy. Antiviral medicine. Medicines to open your bronchioles (bronchodilators). Follow these instructions at home: Medicines Take over-the-counter and prescription medicines only as told by your health care provider. If you were prescribed an antiviral medicine, take it as told by your health care provider. Do not stop using the antiviral even if you start to feel better. Lifestyle  Eat a healthy diet. Do not drink alcohol. Do not use any products that contain nicotine or tobacco, such as cigarettes, e-cigarettes, and chewing tobacco. If you need help quitting, ask your health care provider. Rest at home until your symptoms go away. Return to your normal activities as told by your health care provider. Ask your health care provider what activities are safe for you. General instructions  Drink enough fluid to keep your urine pale yellow. Gargle with a salt-water mixture 3-4 times a day or as needed. To make a salt-water mixture, completely dissolve -1 tsp (3-6 g) of salt in 1   cup (237 mL) of warm water. Keep all follow-up visits as told by your health care provider. This is important. How is this prevented? To prevent catching and spreading RSV: Wash  your hands often with soap and water for at least 20 seconds. If soap and water are not available, use hand sanitizer. Do not touch your face without first cleaning your hands. Stay home if you have symptoms of the common cold or the flu. Cover your nose and mouth when you cough or sneeze. Avoid large groups of people. Keep a safe distance of about 6 feet (1.8 m) from people who are coughing or sneezing. Where to find more information Centers for Disease Control and Prevention: http://www.wolf.info/ Contact a health care provider if: Your symptoms get worse or have not changed after 2 weeks. You have: A fever. Hot flashes, sweating, or chills that keep happening. A cough that brings up much more mucus than usual. A cough that brings up blood. You feel: Very tired (lethargic). Confused. Get help right away if: You have increased or severe trouble breathing. You lose consciousness. These symptoms may represent a serious problem that is an emergency. Do not wait to see if the symptoms will go away. Get medical help right away. Call your local emergency services (911 in the [place] Do not drive yourself to the hospital. Summary Respiratory syncytial virus (RSV) infection is an infection caused by RSV, a common virus. RSV infection can affect the nose, throat, windpipe, and lungs (respiratory system). When the infection is severe, it can cause bronchiolitis or pneumonia. Take over-the-counter and prescription medicines only as told by your health care provider. Contact a health care provider if your symptoms get worse or have not changed after 2 weeks. This information is not intended to replace advice given to you by your health care provider. Make sure you discuss any questions you have with your health care provider. Document Revised: 02/02/2019 Document Reviewed: 02/02/2019 Elsevier Patient Education  [place].

## 2021-03-05 NOTE — Progress Notes (Signed)
Telehealth office visit note for Heidi Reid, PA-C- at Primary Care at [facility]   I connected with current patient today by telephone and verified that I am speaking with the correct person    Location of the patient: Home  Location of the provider: Office - This visit type was conducted due to national recommendations for restrictions regarding the COVID-19 Pandemic (e.g. social distancing) in an effort to limit this patient's exposure and mitigate transmission in our community.    - No physical exam could be performed with this format, beyond that communicated to [place] by the patient/ family members as noted.   - Additionally my office staff/ schedulers were to discuss with the patient that there may be a monetary charge related to this service, depending on their medical insurance.  My understanding is that patient understood and consented to proceed.     _________________________________________________________________________________   History of Present Illness: Patient calls in with exposure to RSV.  Patient takes care of her grandson daily and was recently diagnosed with RSV.  Patient reports started having symptoms of a productive cough and runny nose for couple days.  Has needed to use her albuterol inhaler twice denies fever, nasal congestion, sinus pressure, wheezing or worsening shortness of breath.     GAD 7 : Generalized Anxiety Score 03/05/2021 02/05/2021 10/05/2020  Nervous, Anxious, on Edge 3 2 0  Control/stop worrying 2 2 0  Worry too much - different things 2 2 0  Trouble relaxing 2 2 0  Restless 0 1 0  Easily annoyed or irritable 3 1 0  Afraid - awful might happen 3 2 0  Total GAD 7 Score 15 12 0  Anxiety Difficulty Somewhat difficult - Not difficult at all    Depression screen [facility] 2/9 03/05/2021 02/05/2021 10/05/2020 08/16/2020 04/17/2020  Decreased Interest 1 2 0 0 0  Down, Depressed, Hopeless 2 2 0 0 0  PHQ - 2 Score 3 4 0 0 0  Altered sleeping 2 2 3  3 3   Tired, decreased energy 2 2 2  0 0  Change in appetite 0 2 0 0 0  Feeling bad or failure about yourself  1 2 0 0 0  Trouble concentrating 0 2 0 0 0  Moving slowly or fidgety/restless 0 2 0 0 0  Suicidal thoughts 0 0 0 0 0  PHQ-9 Score 8 16 5 3 3   Difficult doing work/chores - - Not difficult at all - -  Some recent data might be hidden      Impression and Recommendations:     1. RSV exposure   2. Acute cough   3. Restless leg syndrome     RSV exposure: -Discussed with patient RSV is usually self-limited and management is symptomatic care. Recommend to take corticosteroid therapy as directed and continue albuterol inhaler as needed. Take tesslon perles as needed for cough relief. Recommend to continue with home supportive care including warm liquids and rest. Monitor for worsening symptoms.    - As part of my medical decision making, I reviewed the following data within the [place] History obtained from pt /family, CMA notes reviewed and incorporated if applicable, Labs reviewed, Radiograph/ tests reviewed if applicable and OV notes from prior OV's with me, as well as any other specialists she/he has seen since seeing me last, were all reviewed and used in my medical decision making process today.    - Additionally, when appropriate, discussion had  with patient regarding our treatment plan, and their biases/concerns about that plan were used in my medical decision making today.    - The patient agreed with the plan and demonstrated an understanding of the instructions.   No barriers to understanding were identified.     - The patient was advised to call back or seek an in-person evaluation if the symptoms worsen or if the condition fails to improve as anticipated.   Return if symptoms worsen or fail to improve.    No orders of the defined types were placed in this encounter.   Meds ordered this encounter  Medications   predniSONE (DELTASONE) 20 MG  tablet    Sig: Take 2 tablets (40 mg total) by mouth daily with breakfast for 3 days.    Dispense:  6 tablet    Refill:  0    Order Specific Question:   Supervising Provider    Answer:   Beatrice Lecher D [[id number]]   benzonatate (TESSALON) 100 MG capsule    Sig: Take 1 capsule (100 mg total) by mouth 3 (three) times daily as needed for cough.    Dispense:  20 capsule    Refill:  0    Order Specific Question:   Supervising Provider    Answer:   Beatrice Lecher D [[id number]]   gabapentin (NEURONTIN) 300 MG capsule    Sig: Take 2 capsules (600 mg total) by mouth at bedtime. TAKE 2 CAPSULE BY MOUTH AT  BEDTIME    Dispense:  180 capsule    Refill:  1    Requesting 1 year supply    Order Specific Question:   Supervising Provider    Answer:   Beatrice Lecher D [[id number]]     Medications Discontinued During This Encounter  Medication Reason   gabapentin (NEURONTIN) 300 MG capsule Reorder       Time spent on telephone encounter was 7 minutes.   Note:  This note was prepared with assistance of Dragon voice recognition software. Occasional wrong-word or sound-a-like substitutions may have occurred due to the inherent limitations of voice recognition software.    The [place] was signed into law in 2016 which includes the topic of electronic health records.  This provides immediate access to information in MyChart.  This includes consultation notes, operative notes, office notes, lab results and pathology reports.  If you have any questions about what you read please let us know at your next visit or call us at the office.  We are right here with you.   __________________________________________________________________________________     Patient Care Team    Relationship Specialty Notifications Start End  Heidi Foster, [place] PCP - General Physician Assistant  10/17/19   Warren Danes, PA-C Physician Assistant Dermatology  10/22/20      -Vitals obtained;  medications/ allergies reconciled;  personal medical, social, Sx etc.histories were updated by CMA, reviewed by me and are reflected in chart   Patient Active Problem List   Diagnosis Date Noted   Stress at home 06/08/2019   Animal bite of finger 10/07/2018   Acute pain of right knee 11/09/2017   Knee swelling 11/09/2017   Restless leg syndrome 07/28/2017   Low serum creatine 06/30/2017   Acute bacterial conjunctivitis of left eye 06/30/2017   Insomnia 04/01/2017   Cerebral aneurysm    Stroke ([place]) 03/15/2017   Paresthesia of both hands 03/09/2017   Hx of transient ischemic attack (TIA) 03/09/2017   Fibromyalgia 01/22/2017  Breast pain, left 12/15/2016   Generalized weakness 12/15/2016   Leg pain, bilateral 12/15/2016   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   Hoarseness 10/08/2016   Laryngopharyngeal reflux (LPR) 10/08/2016   Centrilobular emphysema ([place]) 09/09/2016   GERD (gastroesophageal reflux disease) 06/30/2016   Dysphagia 06/30/2016   Health care maintenance 06/25/2016   Other fatigue 05/26/2016   Hypertension 05/26/2016   Other hyperlipidemia 05/26/2016   Family history of diabetes mellitus in mother 05/26/2016   Acute pain of right shoulder 05/26/2016   Tobacco use disorder 05/26/2016   Dyspnea on exertion 05/26/2016   Chronic pain of right knee 05/26/2016   History of MI (myocardial infarction) 05/26/2016   History of TIA (transient ischemic attack) 05/26/2016   Numbness and tingling of left lower extremity 05/26/2016   Numbness and tingling in left hand 05/26/2016   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   amLODipine (NORVASC) 10 MG tablet TAKE 1 TABLET EVERY DAY   atorvastatin (LIPITOR) 40 MG tablet TAKE 1 TABLET EVERY DAY   benzonatate (TESSALON) 100 MG capsule Take 1 capsule  (100 mg total) by mouth 3 (three) times daily as needed for cough.   clopidogrel (PLAVIX) 75 MG tablet TAKE 1 TABLET EVERY DAY   DULoxetine (CYMBALTA) 30 MG capsule Take 3 capsules (90 mg total) by mouth daily.   losartan (COZAAR) 100 MG tablet TAKE 1 TABLET EVERY DAY   Multiple Vitamin (MULTIVITAMIN WITH MINERALS) TABS tablet Take 1 tablet daily by mouth.   predniSONE (DELTASONE) 20 MG tablet Take 2 tablets (40 mg total) by mouth daily with breakfast for 3 days.   Spacer/Aero-Holding Chambers (AEROCHAMBER MV) inhaler Use as instructed   triamcinolone (KENALOG) 0.1 % APPLY 1 APPLICATION TOPICALLY TO AFFECTED AREA(S) TWICE DAILY   umeclidinium-vilanterol (ANORO ELLIPTA) 62.5-25 MCG/INH AEPB Inhale 1 puff daily into the lungs.   [DISCONTINUED] gabapentin (NEURONTIN) 300 MG capsule Take 2 capsules (600 mg total) by mouth at bedtime. TAKE 2 CAPSULE BY MOUTH AT  BEDTIME     Allergies:  Allergies  Allergen Reactions   Codeine Anaphylaxis   Bupropion Other (See Comments)    Anger and mental issues     ROS:  See above HPI for pertinent positives and negatives   Objective:   Blood pressure 111/76, pulse 80, height 5\' 1"  (1.549 m), weight 140 lb (63.5 kg).  (if some vitals are omitted, this means that patient was UNABLE to obtain them ) General: A & O * 3; sounds in no acute distress; in usual state of health.  Respiratory: speaking in full sentences, no conversational dyspnea Psych: insight appears good, mood- appears full

## 2021-03-08 ENCOUNTER — Other Ambulatory Visit: Payer: Self-pay | Admitting: Physician Assistant

## 2021-03-08 DIAGNOSIS — I10 Essential (primary) hypertension: Secondary | ICD-10-CM

## 2021-04-10 ENCOUNTER — Other Ambulatory Visit: Payer: Self-pay | Admitting: Physician Assistant

## 2021-04-10 DIAGNOSIS — F321 Major depressive disorder, single episode, moderate: Secondary | ICD-10-CM

## 2021-04-10 DIAGNOSIS — F419 Anxiety disorder, unspecified: Secondary | ICD-10-CM

## 2021-04-16 ENCOUNTER — Ambulatory Visit (INDEPENDENT_AMBULATORY_CARE_PROVIDER_SITE_OTHER): Payer: Medicare HMO | Admitting: Physician Assistant

## 2021-04-16 ENCOUNTER — Other Ambulatory Visit: Payer: Self-pay

## 2021-04-16 ENCOUNTER — Encounter: Payer: Self-pay | Admitting: Physician Assistant

## 2021-04-16 DIAGNOSIS — Z1283 Encounter for screening for malignant neoplasm of skin: Secondary | ICD-10-CM

## 2021-04-16 DIAGNOSIS — Z86018 Personal history of other benign neoplasm: Secondary | ICD-10-CM

## 2021-04-16 DIAGNOSIS — D0439 Carcinoma in situ of skin of other parts of face: Secondary | ICD-10-CM | POA: Diagnosis not present

## 2021-04-16 DIAGNOSIS — L570 Actinic keratosis: Secondary | ICD-10-CM | POA: Diagnosis not present

## 2021-04-16 DIAGNOSIS — D234 Other benign neoplasm of skin of scalp and neck: Secondary | ICD-10-CM | POA: Diagnosis not present

## 2021-04-16 DIAGNOSIS — C4492 Squamous cell carcinoma of skin, unspecified: Secondary | ICD-10-CM

## 2021-04-16 DIAGNOSIS — D485 Neoplasm of uncertain behavior of skin: Secondary | ICD-10-CM

## 2021-04-16 HISTORY — DX: Squamous cell carcinoma of skin, unspecified: C44.92

## 2021-04-16 NOTE — Progress Notes (Signed)
° °  Follow-Up Visit   Subjective  Heidi Foster is a 65 y.o. female who presents for the following: Follow-up (Patient has lesion under right eye x 2 months. No itching or bleeding.History of atyipa left side of nose. Tx mohs 2018.).   The following portions of the chart were reviewed this encounter and updated as appropriate:  Tobacco   Allergies   Meds   Problems   Med Hx   Surg Hx   Fam Hx       Objective  Well appearing patient in no apparent distress; mood and affect are within normal limits.  A full examination was performed including scalp, head, eyes, ears, nose, lips, neck, chest, axillae, abdomen, back, buttocks, bilateral upper extremities, bilateral lower extremities, hands, feet, fingers, toes, fingernails, and toenails. All findings within normal limits unless otherwise noted below.  Left Breast, Left Temporal Scalp Erythematous patches with gritty scale.  Right Inner  Malar Cheek Thick skin toned crust.       Left Temporal Scalp Pink crust        Assessment & Plan  AK (actinic keratosis) (2) Left Breast; Left Temporal Scalp  Destruction of lesion - Left Breast, Left Temporal Scalp Complexity: simple   Destruction method: cryotherapy   Informed consent: discussed and consent obtained   Timeout:  patient name, date of birth, surgical site, and procedure verified Lesion destroyed using liquid nitrogen: Yes   Cryotherapy cycles:  1 Outcome: patient tolerated procedure well with no complications   Post-procedure details: wound care instructions given    Neoplasm of uncertain behavior of skin (2) Right Inner  Malar Cheek  Skin / nail biopsy Type of biopsy: tangential   Informed consent: discussed and consent obtained   Timeout: patient name, date of birth, surgical site, and procedure verified   Procedure prep:  Patient was prepped and draped in usual sterile fashion (Non sterile) Prep type:  Chlorhexidine Anesthesia: the lesion was anesthetized in a  standard fashion   Anesthetic:  1% lidocaine w/ epinephrine 1-100,000 local infiltration Instrument used: flexible razor blade   Hemostasis achieved with: aluminum chloride and electrodesiccation   Outcome: patient tolerated procedure well   Post-procedure details: wound care instructions given    Specimen 1 - Surgical pathology Differential Diagnosis: R/O BCC vs SCC - cautery after biopsy  Check Margins: YES  Left Temporal Scalp  Skin / nail biopsy Type of biopsy: tangential   Informed consent: discussed and consent obtained   Timeout: patient name, date of birth, surgical site, and procedure verified   Procedure prep:  Patient was prepped and draped in usual sterile fashion (Non sterile) Prep type:  Chlorhexidine Anesthesia: the lesion was anesthetized in a standard fashion   Anesthetic:  1% lidocaine w/ epinephrine 1-100,000 local infiltration Instrument used: flexible razor blade   Hemostasis achieved with: aluminum chloride and electrodesiccation   Outcome: patient tolerated procedure well   Post-procedure details: wound care instructions given    Specimen 2 - Surgical pathology Differential Diagnosis: R/O BCC vs SCC - cautery after biopsy  Check Margins: YES    I, Trever Streater, PA-C, have reviewed all documentation's for this visit.  The documentation on 04/16/21 for the exam, diagnosis, procedures and orders are all accurate and complete.

## 2021-04-16 NOTE — Patient Instructions (Signed)

## 2021-04-30 ENCOUNTER — Other Ambulatory Visit: Payer: Self-pay | Admitting: Physician Assistant

## 2021-05-01 ENCOUNTER — Telehealth: Payer: Self-pay

## 2021-05-01 NOTE — Telephone Encounter (Signed)
-----   Message from Warren Danes, [place] sent at 05/01/2021  8:05 AM EST ----- 30-- on cheek end of day

## 2021-05-16 ENCOUNTER — Ambulatory Visit (INDEPENDENT_AMBULATORY_CARE_PROVIDER_SITE_OTHER): Payer: Medicare HMO | Admitting: Physician Assistant

## 2021-05-16 ENCOUNTER — Encounter: Payer: Self-pay | Admitting: Physician Assistant

## 2021-05-16 ENCOUNTER — Other Ambulatory Visit: Payer: Self-pay

## 2021-05-16 DIAGNOSIS — D099 Carcinoma in situ, unspecified: Secondary | ICD-10-CM

## 2021-05-16 DIAGNOSIS — D0439 Carcinoma in situ of skin of other parts of face: Secondary | ICD-10-CM | POA: Diagnosis not present

## 2021-05-16 NOTE — Patient Instructions (Signed)

## 2021-05-22 ENCOUNTER — Encounter: Payer: Self-pay | Admitting: Physician Assistant

## 2021-05-22 NOTE — Progress Notes (Signed)
° °  Follow-Up Visit   Subjective  Heidi Foster is a 66 y.o. female who presents for the following: Procedure (Patient here today for treatment of CIS x 1 right inner malar cheek ).   The following portions of the chart were reviewed this encounter and updated as appropriate:  Tobacco   Allergies   Meds   Problems   Med Hx   Surg Hx   Fam Hx       Objective  Well appearing patient in no apparent distress; mood and affect are within normal limits.  A focused examination was performed including right cheek. Relevant physical exam findings are noted in the Assessment and Plan.  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  Destruction of lesion Complexity: simple   Destruction method: electrodesiccation and curettage   Informed consent: discussed and consent obtained   Timeout:  patient name, date of birth, surgical site, and procedure verified Anesthesia: the lesion was anesthetized in a standard fashion   Anesthetic:  1% lidocaine w/ epinephrine 1-100,000 local infiltration Curettage performed in three different directions: Yes   Electrodesiccation performed over the curetted area: Yes   Curettage cycles:  3 Final wound size (cm):  0.8 Hemostasis achieved with:  aluminum chloride Outcome: patient tolerated procedure well with no complications   Post-procedure details: wound care instructions given   Additional details:  Wound inoculated with 5% fluorouracil solution    I, Mao Lockner, PA-C, have reviewed all documentation's for this visit.  The documentation on 05/22/21 for the exam, diagnosis, procedures and orders are all accurate and complete.

## 2021-06-04 ENCOUNTER — Telehealth: Payer: Self-pay | Admitting: Physician Assistant

## 2021-06-04 DIAGNOSIS — G2581 Restless legs syndrome: Secondary | ICD-10-CM

## 2021-06-04 MED ORDER — GABAPENTIN 300 MG PO CAPS: 600.0000 mg | ORAL_CAPSULE | Freq: Every day | ORAL | 0 refills | Status: DC

## 2021-06-04 NOTE — Telephone Encounter (Signed)
Patient is aware 

## 2021-06-04 NOTE — Telephone Encounter (Signed)
Patient is requesting a refill of the Gabapentin. Please advise. [phone]

## 2021-07-05 ENCOUNTER — Other Ambulatory Visit: Payer: Self-pay

## 2021-07-05 ENCOUNTER — Encounter: Payer: Self-pay | Admitting: Nurse Practitioner

## 2021-07-05 ENCOUNTER — Ambulatory Visit (INDEPENDENT_AMBULATORY_CARE_PROVIDER_SITE_OTHER): Payer: Medicare HMO | Admitting: Nurse Practitioner

## 2021-07-05 VITALS — BP 105/61 | HR 71 | Temp 97.8°F | Ht 61.0 in | Wt 141.4 lb

## 2021-07-05 DIAGNOSIS — R2689 Other abnormalities of gait and mobility: Secondary | ICD-10-CM | POA: Diagnosis not present

## 2021-07-05 DIAGNOSIS — D234 Other benign neoplasm of skin of scalp and neck: Secondary | ICD-10-CM | POA: Insufficient documentation

## 2021-07-05 DIAGNOSIS — Z8673 Personal history of transient ischemic attack (TIA), and cerebral infarction without residual deficits: Secondary | ICD-10-CM | POA: Diagnosis not present

## 2021-07-05 DIAGNOSIS — R269 Unspecified abnormalities of gait and mobility: Secondary | ICD-10-CM

## 2021-07-05 DIAGNOSIS — R6889 Other general symptoms and signs: Secondary | ICD-10-CM | POA: Diagnosis not present

## 2021-07-05 DIAGNOSIS — J432 Centrilobular emphysema: Secondary | ICD-10-CM | POA: Diagnosis not present

## 2021-07-05 MED ORDER — QUAD CANE MISC: 0 refills | Status: AC

## 2021-07-05 NOTE — Progress Notes (Signed)
Established patient visit ? ? ?Patient: Heidi Foster   DOB: Dec 22, 1955   66 y.o. Female  MRN: [id number] ?Visit Date: 07/05/2021 ? ?Chief Complaint  ?Patient presents with  ? mobility   ? ?Subjective  ?  ?HPI  ?The patient states that her walking is "getting really bad." Has had a lot of trouble with balance. Feels unsteady on her feet. Often feels like she is going to fall and will bang into a wall. Denies head injury or trauma.  She did have MRI back in 2018. The results were as follows: 1. Acute/subacute infarcts involving the high posterior right ?sylvian fissure, right frontal operculum, and right parietal white matter. This corresponds with the patient's left-sided hand and finger numbness. 2. Other periventricular and subcortical white matter changes bilaterally are moderately advanced for age and likely reflect the sequela of chronic microvascular ischemia. ?Has noted an itchy lesion on the top of her scalp yesterday. States that she has had a few things like this, off and on. States that her grandson told her that it looks like she has a large mosquito bite in her scalp.  ? ?Medications: ?Outpatient Medications Prior to Visit  ?Medication Sig  ? albuterol (PROVENTIL HFA;VENTOLIN HFA) 108 (90 Base) MCG/ACT inhaler Inhale 2 puffs into the lungs every 4 (four) hours as needed for wheezing or shortness of breath.  ? amLODipine (NORVASC) 10 MG tablet TAKE 1 TABLET EVERY DAY  ? atorvastatin (LIPITOR) 40 MG tablet TAKE 1 TABLET EVERY DAY  ? benzonatate (TESSALON) 100 MG capsule Take 1 capsule (100 mg total) by mouth 3 (three) times daily as needed for cough.  ? clopidogrel (PLAVIX) 75 MG tablet TAKE 1 TABLET EVERY DAY  ? DULoxetine (CYMBALTA) 30 MG capsule TAKE 3 CAPSULES EVERY DAY  ? gabapentin (NEURONTIN) 300 MG capsule Take 2 capsules (600 mg total) by mouth at bedtime. TAKE 2 CAPSULE BY MOUTH AT  BEDTIME  ? losartan (COZAAR) 100 MG tablet Take 1 tablet (100 mg total) by mouth daily. **PLEASE CONTACT OUR OFFICE TO  SCHEDULE A FOLLOW UP FOR FUTURE MED REFILLS**  ? Multiple Vitamin (MULTIVITAMIN WITH MINERALS) TABS tablet Take 1 tablet daily by mouth.  ? Spacer/Aero-Holding Chambers (AEROCHAMBER MV) inhaler Use as instructed  ? triamcinolone (KENALOG) 0.1 % APPLY 1 APPLICATION TOPICALLY TO AFFECTED AREA(S) TWICE DAILY  ? umeclidinium-vilanterol (ANORO ELLIPTA) 62.5-25 MCG/INH AEPB Inhale 1 puff daily into the lungs.  ? ?No facility-administered medications prior to visit.  ? ? ?Review of Systems  ?Constitutional:  Positive for activity change. Negative for appetite change, chills, fatigue and fever.  ?HENT:  Negative for congestion, postnasal drip, rhinorrhea, sinus pressure, sinus pain, sneezing and sore throat.   ?Eyes: Negative.   ?Respiratory:  Negative for cough, chest tightness, shortness of breath and wheezing.   ?Cardiovascular:  Negative for chest pain and palpitations.  ?Gastrointestinal:  Negative for abdominal pain, constipation, diarrhea, nausea and vomiting.  ?Endocrine: Negative for cold intolerance, heat intolerance, polydipsia and polyuria.  ?Genitourinary:  Negative for dyspareunia, dysuria, flank pain, frequency and urgency.  ?Musculoskeletal:  Positive for gait problem. Negative for arthralgias, back pain and myalgias.  ?Skin:  Negative for rash.  ?     Itchy skin lump on her scalp.   ?Allergic/Immunologic: Negative for environmental allergies.  ?Neurological:  Positive for dizziness, weakness and light-headedness. Negative for headaches.  ?Hematological:  Negative for adenopathy.  ?Psychiatric/Behavioral:  The patient is not nervous/anxious.   ? ? Objective  ?  ? ?Today's Vitals  ?  07/05/21 0858  ?BP: 105/61  ?Pulse: 71  ?Temp: 97.8 ?F (36.6 ?C)  ?SpO2: 98%  ?Weight: 141 lb 6.4 oz (64.1 kg)  ? ?Body mass index is 26.72 kg/m?.  ? ?Physical Exam ?Vitals and nursing note reviewed.  ?Constitutional:   ?   Appearance: Normal appearance. She is well-developed.  ?HENT:  ?   Head: Normocephalic and atraumatic.   ?Eyes:  ?   Pupils: Pupils are equal, round, and reactive to light.  ?Neck:  ?   Vascular: No carotid bruit.  ?Cardiovascular:  ?   Rate and Rhythm: Normal rate and regular rhythm.  ?   Pulses: Normal pulses.  ?   Heart sounds: Normal heart sounds.  ?Pulmonary:  ?   Effort: Pulmonary effort is normal.  ?   Breath sounds: Normal breath sounds.  ?Abdominal:  ?   Palpations: Abdomen is soft.  ?Musculoskeletal:     ?   General: Normal range of motion.  ?   Cervical back: Normal range of motion and neck supple.  ?Lymphadenopathy:  ?   Cervical: No cervical adenopathy.  ?Skin: ?   General: Skin is warm and dry.  ?   Capillary Refill: Capillary refill takes less than 2 seconds.  ?Neurological:  ?   General: No focal deficit present.  ?   Mental Status: She is alert and oriented to person, place, and time.  ?   Cranial Nerves: No cranial nerve deficit.  ?   Sensory: No sensory deficit.  ?   Motor: Weakness present.  ?   Coordination: Coordination normal.  ?   Gait: Gait abnormal.  ?   Deep Tendon Reflexes: Reflexes normal.  ?   Comments: Slight weakness noted to left arm/shoulder. Also some decreased strength in right leg. Uses walking cane on her right side.   ?Psychiatric:     ?   Mood and Affect: Mood normal.     ?   Behavior: Behavior normal.     ?   Thought Content: Thought content normal.     ?   Judgment: Judgment normal.  ?  ? Assessment & Plan  ?  ? ?1. Balance problem ?New prescription for [facility] to help improve balance and mobility. Will get new MRI brain for further evaluation. Consider referral to neurology based on MRI results.  ?- Misc. Devices (QUAD CANE) MISC; Use when walking for balance and mobility assistance  Dispense: 1 each; Refill: 0 ?- MR Brain Wo Contrast; Future ? ?2. Gait abnormality ?ew prescription for Quad Cane to help improve balance and mobility. Will get new MRI brain for further evaluation. Consider referral to neurology based on MRI results.  ?- Misc. Devices (QUAD CANE) MISC; Use  when walking for balance and mobility assistance  Dispense: 1 each; Refill: 0 ?- MR Brain Wo Contrast; Future ? ?3. H/O TIA (transient ischemic attack) and stroke ?Will get new MRI to evaluate for new TIA/stroke or progressive small vessel disease. Will consider referral to neurology as indicated.  ?- MR Brain Wo Contrast; Future ? ?4. Benign neoplasm of scalp ?Small, round, pink lesion on top of scalp. Slightly rough in texture. Skin is intact and there is no drainage present.   ? ?Return in about 2 weeks (around 07/19/2021), or neuro deficit - balance - review MRI.  ?   ? ? ? ?Ronnell Freshwater, NP  ?[facility] ?[phone] (phone) ?[phone] (fax) ? ?[facility] ?

## 2021-07-15 ENCOUNTER — Encounter: Payer: Self-pay | Admitting: Physician Assistant

## 2021-07-18 ENCOUNTER — Ambulatory Visit
Admission: RE | Admit: 2021-07-18 | Discharge: 2021-07-18 | Disposition: A | Discharge status: Home / Self Care | Payer: Medicare HMO | Source: Ambulatory Visit | Attending: Nurse Practitioner | Admitting: Nurse Practitioner

## 2021-07-18 ENCOUNTER — Other Ambulatory Visit: Payer: Self-pay

## 2021-07-18 DIAGNOSIS — R2689 Other abnormalities of gait and mobility: Secondary | ICD-10-CM

## 2021-07-18 DIAGNOSIS — Z8673 Personal history of transient ischemic attack (TIA), and cerebral infarction without residual deficits: Secondary | ICD-10-CM

## 2021-07-18 DIAGNOSIS — R269 Unspecified abnormalities of gait and mobility: Secondary | ICD-10-CM

## 2021-07-18 DIAGNOSIS — R29818 Other symptoms and signs involving the nervous system: Secondary | ICD-10-CM | POA: Diagnosis not present

## 2021-07-18 DIAGNOSIS — R6889 Other general symptoms and signs: Secondary | ICD-10-CM | POA: Diagnosis not present

## 2021-07-18 DIAGNOSIS — R9082 White matter disease, unspecified: Secondary | ICD-10-CM | POA: Diagnosis not present

## 2021-07-19 ENCOUNTER — Encounter: Payer: Self-pay | Admitting: Nurse Practitioner

## 2021-07-19 ENCOUNTER — Ambulatory Visit (INDEPENDENT_AMBULATORY_CARE_PROVIDER_SITE_OTHER): Payer: Medicare HMO | Admitting: Nurse Practitioner

## 2021-07-19 VITALS — BP 113/70 | HR 73 | Temp 98.1°F | Ht 61.0 in | Wt 140.1 lb

## 2021-07-19 DIAGNOSIS — R2689 Other abnormalities of gait and mobility: Secondary | ICD-10-CM

## 2021-07-19 DIAGNOSIS — G2581 Restless legs syndrome: Secondary | ICD-10-CM

## 2021-07-19 DIAGNOSIS — L989 Disorder of the skin and subcutaneous tissue, unspecified: Secondary | ICD-10-CM

## 2021-07-19 DIAGNOSIS — L2084 Intrinsic (allergic) eczema: Secondary | ICD-10-CM | POA: Diagnosis not present

## 2021-07-19 DIAGNOSIS — R269 Unspecified abnormalities of gait and mobility: Secondary | ICD-10-CM

## 2021-07-19 DIAGNOSIS — R6889 Other general symptoms and signs: Secondary | ICD-10-CM | POA: Diagnosis not present

## 2021-07-19 MED ORDER — TRIAMCINOLONE ACETONIDE 0.1 % EX CREA: TOPICAL_CREAM | CUTANEOUS | 1 refills | Status: DC

## 2021-07-19 MED ORDER — GABAPENTIN 300 MG PO CAPS: 600.0000 mg | ORAL_CAPSULE | Freq: Every day | ORAL | 1 refills | Status: DC

## 2021-07-19 NOTE — Progress Notes (Signed)
Established patient visit ? ? ?Patient: Heidi Foster   DOB: [birth year]   66 y.o. Female  MRN: [id number] ?Visit Date: 07/19/2021 ? ? ?Chief Complaint  ?Patient presents with  ? Follow-up  ? ?Subjective  ?  ?HPI  ?The patient is here for follow up of balance and gait abnormalities. Has got her quad cane. She does feel more stable with this over standard walking cane. Has not fallen since she got the new quad cane. She does still feel like she is losing her balance.  ?Restarted CBD gummies to help with pain. She takes these on as needed basis. Not every day. Gabapentin also helps with her pain. She does need a refill of the gabapentin. ?Still not sleeping. Wakes up every 2 to 3 hours due to pain.  ?-she did have MRI of the brain yesterday. Awaiting results to review with patient.  ? ? ?Medications: ?Outpatient Medications Prior to Visit  ?Medication Sig  ? albuterol (PROVENTIL HFA;VENTOLIN HFA) 108 (90 Base) MCG/ACT inhaler Inhale 2 puffs into the lungs every 4 (four) hours as needed for wheezing or shortness of breath.  ? amLODipine (NORVASC) 10 MG tablet TAKE 1 TABLET EVERY DAY  ? atorvastatin (LIPITOR) 40 MG tablet TAKE 1 TABLET EVERY DAY  ? clopidogrel (PLAVIX) 75 MG tablet TAKE 1 TABLET EVERY DAY  ? DULoxetine (CYMBALTA) 30 MG capsule TAKE 3 CAPSULES EVERY DAY  ? losartan (COZAAR) 100 MG tablet Take 1 tablet (100 mg total) by mouth daily. **PLEASE CONTACT OUR OFFICE TO SCHEDULE A FOLLOW UP FOR FUTURE MED REFILLS**  ? Misc. Devices (QUAD CANE) MISC Use when walking for balance and mobility assistance  ? Multiple Vitamin (MULTIVITAMIN WITH MINERALS) TABS tablet Take 1 tablet daily by mouth.  ? Spacer/Aero-Holding Chambers (AEROCHAMBER MV) inhaler Use as instructed  ? umeclidinium-vilanterol (ANORO ELLIPTA) 62.5-25 MCG/INH AEPB Inhale 1 puff daily into the lungs.  ? [DISCONTINUED] gabapentin (NEURONTIN) 300 MG capsule Take 2 capsules (600 mg total) by mouth at bedtime. TAKE 2 CAPSULE BY MOUTH AT  BEDTIME  ? [DISCONTINUED]  triamcinolone (KENALOG) 0.1 % APPLY 1 APPLICATION TOPICALLY TO AFFECTED AREA(S) TWICE DAILY  ? [DISCONTINUED] benzonatate (TESSALON) 100 MG capsule Take 1 capsule (100 mg total) by mouth 3 (three) times daily as needed for cough.  ? ?No facility-administered medications prior to visit.  ? ? ?Review of Systems  ?Constitutional:  Positive for activity change. Negative for appetite change, chills, fatigue and fever.  ?     Increased activity level since getting four pronged cane.   ?HENT:  Negative for congestion, postnasal drip, rhinorrhea, sinus pressure, sinus pain, sneezing and sore throat.   ?Eyes: Negative.   ?Respiratory:  Negative for cough, chest tightness, shortness of breath and wheezing.   ?Cardiovascular:  Negative for chest pain and palpitations.  ?Gastrointestinal:  Negative for abdominal pain, constipation, diarrhea, nausea and vomiting.  ?Endocrine: Negative for cold intolerance, heat intolerance, polydipsia and polyuria.  ?Genitourinary:  Negative for dyspareunia, dysuria, flank pain, frequency and urgency.  ?Musculoskeletal:  Positive for gait problem. Negative for arthralgias, back pain and myalgias.  ?Skin:  Negative for rash.  ?     Itchy skin lump on her scalp. Lump has improved in size. She states that she has generally itchy and dry skin.   ?Allergic/Immunologic: Negative for environmental allergies.  ?Neurological:  Positive for dizziness, weakness and light-headedness. Negative for headaches.  ?Hematological:  Negative for adenopathy.  ?Psychiatric/Behavioral:  The patient is not nervous/anxious.   ? ? ? ?  Objective  ?  ? ?Today's Vitals  ? 07/19/21 0942  ?BP: 113/70  ?Pulse: 73  ?Temp: 98.1 ?F (36.7 ?C)  ?SpO2: 98%  ?Weight: 140 lb 1.9 oz (63.6 kg)  ?Height: '5\' 1"'$  (1.549 m)  ? ?Body mass index is 26.48 kg/m?.  ? ?BP Readings from Last 3 Encounters:  ?07/19/21 113/70  ?07/05/21 105/61  ?03/05/21 111/76  ?  ?Wt Readings from Last 3 Encounters:  ?07/19/21 140 lb 1.9 oz (63.6 kg)  ?07/05/21 141  lb 6.4 oz (64.1 kg)  ?03/05/21 140 lb (63.5 kg)  ?  ?Physical Exam ?Vitals and nursing note reviewed.  ?Constitutional:   ?   Appearance: Normal appearance. She is well-developed.  ?HENT:  ?   Head: Normocephalic and atraumatic.  ?Eyes:  ?   Pupils: Pupils are equal, round, and reactive to light.  ?Cardiovascular:  ?   Rate and Rhythm: Normal rate and regular rhythm.  ?   Pulses: Normal pulses.  ?   Heart sounds: Normal heart sounds.  ?Pulmonary:  ?   Effort: Pulmonary effort is normal.  ?   Breath sounds: Normal breath sounds.  ?Abdominal:  ?   Palpations: Abdomen is soft.  ?Musculoskeletal:     ?   General: Normal range of motion.  ?   Cervical back: Normal range of motion and neck supple.  ?Lymphadenopathy:  ?   Cervical: No cervical adenopathy.  ?Skin: ?   General: Skin is warm and dry.  ?   Capillary Refill: Capillary refill takes less than 2 seconds.  ?   Comments: Dry and flaky skin present on her back.   ?Neurological:  ?   General: No focal deficit present.  ?   Mental Status: She is alert and oriented to person, place, and time.  ?   Comments: Slight weakness noted to left arm/shoulder. Also some decreased strength in right leg. Uses walking cane on her right side.  ?Psychiatric:     ?   Mood and Affect: Mood normal.     ?   Behavior: Behavior normal.     ?   Thought Content: Thought content normal.     ?   Judgment: Judgment normal.  ?  ? ? Assessment & Plan  ?  ?1. Restless leg syndrome ?We will change gabapentin dosing to 300 mg capsules.  Take 3 capsules at bedtime.  Reassess at next visit. ?- gabapentin (NEURONTIN) 300 MG capsule; Take 2 capsules (600 mg total) by mouth at bedtime. TAKE 3 CAPSULE BY MOUTH AT  BEDTIME  Dispense: 270 capsule; Refill: 1 ? ?2. Intrinsic atopic dermatitis ?Patient may use triamcinolone cream twice daily as needed to affected areas. ?- triamcinolone cream (KENALOG) 0.1 %; APPLY 1 APPLICATION TOPICALLY TO AFFECTED AREA(S) TWICE DAILY  Dispense: 450 g; Refill: 1 ? ?3. Balance  problem ?Awaiting results of MRI.  We will contact patient with results as soon as they are available.  Refer to neurology as indicated. ? ?4. Gait abnormality ?Awaiting results of MRI.  We will contact patient with results as soon as they are available.  Refer to neurology as indicated. ? ?  ?Problem List Items Addressed This Visit   ? ?  ? Musculoskeletal and Integument  ? Skin lesion  ? Intrinsic atopic dermatitis  ? Relevant Medications  ? triamcinolone cream (KENALOG) 0.1 %  ?  ? Other  ? Restless leg syndrome - Primary  ? Relevant Medications  ? gabapentin (NEURONTIN) 300 MG capsule  ? Balance  problem  ? Gait abnormality  ?  ? ?Return in about 6 months (around 01/19/2022) for medicare wellness, FBW a week prior to visit.  ?   ? ? ? ? ?Ronnell Freshwater, NP  ?[facility] at [facility] ?[phone] (phone) ?[phone] (fax) ? ?[facility]  ?

## 2021-07-22 NOTE — Progress Notes (Signed)
Please let the patient know that we finally got her MRI back. There is nothing acute going on. Does show progression of the previously noted white matter disease. It is advanced for her age. With the symptoms she is having, I would like for her to consult with neurology. Does she have one  she sees? Does she need a new referral?

## 2021-07-28 DIAGNOSIS — L2084 Intrinsic (allergic) eczema: Secondary | ICD-10-CM | POA: Insufficient documentation

## 2021-07-28 DIAGNOSIS — L989 Disorder of the skin and subcutaneous tissue, unspecified: Secondary | ICD-10-CM | POA: Insufficient documentation

## 2021-07-29 DIAGNOSIS — Z0100 Encounter for examination of eyes and vision without abnormal findings: Secondary | ICD-10-CM | POA: Diagnosis not present

## 2021-07-29 DIAGNOSIS — H5203 Hypermetropia, bilateral: Secondary | ICD-10-CM | POA: Diagnosis not present

## 2021-07-29 DIAGNOSIS — H2513 Age-related nuclear cataract, bilateral: Secondary | ICD-10-CM | POA: Diagnosis not present

## 2021-07-29 DIAGNOSIS — R6889 Other general symptoms and signs: Secondary | ICD-10-CM | POA: Diagnosis not present

## 2021-08-21 ENCOUNTER — Other Ambulatory Visit: Payer: Self-pay | Admitting: Physician Assistant

## 2021-10-17 ENCOUNTER — Other Ambulatory Visit: Payer: Self-pay | Admitting: Physician Assistant

## 2021-10-17 DIAGNOSIS — F419 Anxiety disorder, unspecified: Secondary | ICD-10-CM

## 2021-10-17 DIAGNOSIS — F321 Major depressive disorder, single episode, moderate: Secondary | ICD-10-CM

## 2021-10-30 ENCOUNTER — Other Ambulatory Visit: Payer: Self-pay | Admitting: Physician Assistant

## 2021-10-30 DIAGNOSIS — E7849 Other hyperlipidemia: Secondary | ICD-10-CM

## 2021-10-30 DIAGNOSIS — I10 Essential (primary) hypertension: Secondary | ICD-10-CM

## 2021-10-30 DIAGNOSIS — Z8673 Personal history of transient ischemic attack (TIA), and cerebral infarction without residual deficits: Secondary | ICD-10-CM

## 2021-11-13 ENCOUNTER — Ambulatory Visit: Payer: Medicare HMO | Admitting: Physician Assistant

## 2021-11-26 ENCOUNTER — Encounter: Payer: Self-pay | Admitting: Physician Assistant

## 2021-11-26 ENCOUNTER — Ambulatory Visit (INDEPENDENT_AMBULATORY_CARE_PROVIDER_SITE_OTHER): Payer: Medicare HMO | Admitting: Physician Assistant

## 2021-11-26 VITALS — BP 99/62 | HR 71 | Temp 97.7°F | Ht 61.0 in | Wt 139.0 lb

## 2021-11-26 DIAGNOSIS — G8929 Other chronic pain: Secondary | ICD-10-CM | POA: Diagnosis not present

## 2021-11-26 DIAGNOSIS — M25561 Pain in right knee: Secondary | ICD-10-CM | POA: Diagnosis not present

## 2021-11-26 DIAGNOSIS — W19XXXA Unspecified fall, initial encounter: Secondary | ICD-10-CM

## 2021-11-26 DIAGNOSIS — R6889 Other general symptoms and signs: Secondary | ICD-10-CM | POA: Diagnosis not present

## 2021-11-26 MED ORDER — METHYLPREDNISOLONE 4 MG PO TBPK: ORAL_TABLET | ORAL | 0 refills | Status: DC

## 2021-11-26 NOTE — Progress Notes (Signed)
Established patient acute visit   Patient: Heidi Foster   DOB: 01-Nov-1955   66 y.o. Female  MRN: [id number] Visit Date: 11/26/2021  Chief Complaint  Patient presents with   Fall   Subjective    HPI  Patient presents to discuss balance issues. Patient reports fell the weekend of the 4th and last Friday. Most recent fall she landed on her bottom. Denies head injury or LOC. Patient reports her right leg just gave out for both falls. Uses a cane for ambulation. States there is carpet at her house. Patient does have a history of chronic right knee pain. Patient saw orthopedics last year and received a cortisone injection which helped her right pain for a couple of days.     Medications: Outpatient Medications Prior to Visit  Medication Sig   albuterol (PROVENTIL HFA;VENTOLIN HFA) 108 (90 Base) MCG/ACT inhaler Inhale 2 puffs into the lungs every 4 (four) hours as needed for wheezing or shortness of breath.   amLODipine (NORVASC) 10 MG tablet TAKE 1 TABLET EVERY DAY   atorvastatin (LIPITOR) 40 MG tablet TAKE 1 TABLET EVERY DAY   clopidogrel (PLAVIX) 75 MG tablet TAKE 1 TABLET EVERY DAY   DULoxetine (CYMBALTA) 30 MG capsule TAKE 3 CAPSULES EVERY DAY   gabapentin (NEURONTIN) 300 MG capsule Take 2 capsules (600 mg total) by mouth at bedtime. TAKE 3 CAPSULE BY MOUTH AT  BEDTIME   losartan (COZAAR) 100 MG tablet Take 1 tablet (100 mg total) by mouth daily.   Misc. Devices (QUAD CANE) MISC Use when walking for balance and mobility assistance   Multiple Vitamin (MULTIVITAMIN WITH MINERALS) TABS tablet Take 1 tablet daily by mouth.   Spacer/Aero-Holding Chambers (AEROCHAMBER MV) inhaler Use as instructed   triamcinolone cream (KENALOG) 0.1 % APPLY 1 APPLICATION TOPICALLY TO AFFECTED AREA(S) TWICE DAILY   umeclidinium-vilanterol (ANORO ELLIPTA) 62.5-25 MCG/INH AEPB Inhale 1 puff daily into the lungs.   No facility-administered medications prior to visit.    Review of Systems Review of Systems:  A  fourteen system review of systems was performed and found to be positive as per HPI.     Objective    BP 99/62   Pulse 71   Temp 97.7 F (36.5 C)   Ht '5\' 1"'$  (1.549 m)   Wt 139 lb (63 kg)   SpO2 94%   BMI 26.26 kg/m    Physical Exam  General:  Pleasant and cooperative, appropriate for stated age.  Neuro:  Alert and oriented,  extra-ocular muscles intact  HEENT:  Normocephalic, atraumatic, neck supple  Skin:  no gross rash, warm, pink. Cardiac:  RRR Respiratory: Speaking in full sentences, unlabored.  Vascular:  Ext warm, no cyanosis apprec.; cap RF less 2 sec. MSK: tenderness of lateral and anterior knee, mild swelling noted, no joint line tenderness, negative anterior and posterior drawer tests Psych:  No HI/SI, judgement and insight good, Euthymic mood. Full Affect.   No results found for any visits on 11/26/21.  Assessment & Plan     Patient presenting with 2 falls this month secondary to her right knee buckling. Patient does have evidence of moderate right knee osteoarthritis and degenerative joint disease, recommend to follow-up with orthopedist to discuss additional treatment options. Will start oral corticosteroid therapy to help pain and swelling. Discussed fall prevention strategies including hazards at home. Discussed using walking assistive device as needed.     Return if symptoms worsen or fail to improve.        Lorrene Reid,  PA-C  [facility] at [facility] [phone] (phone) [phone] (fax)  [place]

## 2021-11-26 NOTE — Patient Instructions (Signed)
Understanding Your Risk for Falls Each year, millions of people have serious injuries from falls. It is important to understand your risk for falling. Talk with your health care provider about your risk and what you can do to lower it. There are actions you can take at home to lower your risk and prevent falls. If you do have a serious fall, make sure to tell your health care provider. Falling once raises your risk of falling again. How can falls affect me? Serious injuries from falls are common. These include: Broken bones, such as hip fractures. Head injuries, such as traumatic brain injuries (TBI) or concussion. A fear of falling can cause you to avoid activities and stay at home. This can make your muscles weaker and actually raise your risk for a fall. What can increase my risk? There are a number of risk factors that increase your risk for falling. The more risk factors you have, the higher your risk of falling. Serious injuries from a fall happen most often to people older than age 78. Children and young adults ages 38-29 are also at higher risk. Common risk factors include: Weakness in the lower body. Lack (deficiency) of vitamin D. Being generally weak or confused due to long-term (chronic) illness. Dizziness or balance problems. Poor vision. Medicines that cause dizziness or drowsiness. These can include medicines for your blood pressure, heart, anxiety, insomnia, or edema, as well as pain medicines and muscle relaxants. Other risk factors include: Drinking alcohol. Having had a fall in the past. Having depression. Having foot pain or wearing improper footwear. Working at a dangerous job. Having any of the following in your home: Tripping hazards, such as floor clutter or loose rugs. Poor lighting. Pets. Having dementia or memory loss. What actions can I take to lower my risk of falling?     Physical activity Maintain physical fitness. Do strength and balance exercises.  Consider taking a regular class to build strength and balance. Yoga and tai chi are good options. Vision Have your eyes checked every year and your vision prescription updated as needed. Walking aids and footwear Wear nonskid shoes. Do not wear high heels. Do not walk around the house in socks or slippers. Use a cane or walker as told by your health care provider. Home safety Attach secure railings on both sides of your stairs. Install grab bars for your tub, shower, and toilet. Use a bath mat in your tub or shower. Use good lighting in all rooms. Keep a flashlight near your bed. Make sure there is a clear path from your bed to the bathroom. Use night-lights. Do not use throw rugs. Make sure all carpeting is taped or tacked down securely. Remove all clutter from walkways and stairways, including extension cords. Repair uneven or broken steps. Avoid walking on icy or slippery surfaces. Walk on the grass instead of on icy or slick sidewalks. Use ice melt to get rid of ice on walkways. Use a cordless phone. Questions to ask your health care provider Can you help me check my risk for a fall? Do any of my medicines make me more likely to fall? Should I take a vitamin D supplement? What exercises can I do to improve my strength and balance? Should I make an appointment to have my vision checked? Do I need a bone density test to check for weak bones or osteoporosis? Would it help to use a cane or a walker? Where to find more information Centers for Disease Control and Prevention,  [place]ADI: www.cdc.gov Community-Based Fall Prevention Programs: www.cdc.gov National Institute on Aging: www.nia.nih.gov Contact a health care provider if: You fall at home. You are afraid of falling at home. You feel weak, drowsy, or dizzy. Summary Serious injuries from a fall happen most often to people older than age 65. Children and young adults ages 15-29 are also at higher risk. Talk with your health care  provider about your risks for falling and how to lower those risks. Taking certain precautions at home can lower your risk for falling. If you fall, always tell your health care provider. This information is not intended to replace advice given to you by your health care provider. Make sure you discuss any questions you have with your health care provider. Document Revised: 11/16/2019 Document Reviewed: 11/16/2019 Elsevier Patient Education  2023 Elsevier Inc.  

## 2021-12-03 ENCOUNTER — Telehealth: Payer: Self-pay

## 2021-12-03 ENCOUNTER — Ambulatory Visit (INDEPENDENT_AMBULATORY_CARE_PROVIDER_SITE_OTHER): Payer: Medicare HMO

## 2021-12-03 ENCOUNTER — Ambulatory Visit: Payer: Medicare HMO | Admitting: Orthopaedic Surgery

## 2021-12-03 DIAGNOSIS — M1711 Unilateral primary osteoarthritis, right knee: Secondary | ICD-10-CM

## 2021-12-03 DIAGNOSIS — R6889 Other general symptoms and signs: Secondary | ICD-10-CM | POA: Diagnosis not present

## 2021-12-03 NOTE — Telephone Encounter (Signed)
Can we get this patient approved for a R knee gel injection

## 2021-12-03 NOTE — Progress Notes (Unsigned)
Office Visit Note   Patient: Heidi Foster           Date of Birth: June 10, 1955           MRN: [id number] Visit Date: 12/03/2021              Requested by: Lorrene Reid, PA-C [place] [address] [place],  [place] [address] PCP: Lorrene Reid, PA-C   Assessment & Plan: Visit Diagnoses:  1. Primary osteoarthritis of right knee     Plan: Impression is right knee osteoarthritis.  At this point, I recommended proceeding with viscosupplementation injection as cortisone injections have only provided temporary relief.  She does note that she has a hard time getting a ride here and would actually be interested in repeat cortisone injection today as well as Visco supplementation approval.  She will follow-up with [place] once approved for the gel injection.  Call with concerns or questions in the meantime.  This patient is diagnosed with osteoarthritis of the knee(s).    Radiographs show evidence of joint space narrowing, osteophytes, subchondral sclerosis and/or subchondral cysts.  This patient has knee pain which interferes with functional and activities of daily living.    This patient has experienced inadequate response, adverse effects and/or intolerance with conservative treatments such as acetaminophen, NSAIDS, topical creams, physical therapy or regular exercise, knee bracing and/or weight loss.   This patient has experienced inadequate response or has a contraindication to intra articular steroid injections for at least 3 months.   This patient is not scheduled to have a total knee replacement within 6 months of starting treatment with viscosupplementation.   Follow-Up Instructions: Return if symptoms worsen or fail to improve.   Orders:  Orders Placed This Encounter  Procedures   Large Joint Inj: R knee   XR KNEE 3 VIEW RIGHT   No orders of the defined types were placed in this encounter.     Procedures: Large Joint Inj: R knee on 12/03/2021 8:39 AM Indications:  pain Details: 22 G needle, anterolateral approach Medications: 2 mL lidocaine 1 %; 2 mL bupivacaine 0.25 %; 40 mg methylPREDNISolone acetate 40 MG/ML      Clinical Data: No additional findings.   Subjective: Chief Complaint  Patient presents with   Right Knee - Pain    HPI patient is a pleasant 66 year old female comes in today with chronic right knee pain.  She has been dealing with this for years.  She was seen in our office in April of last year a cortisone injection was performed.  This unfortunately only provided temporary relief.  Her symptoms have gradually returned and have started to worsen.  The pain is to the lateral aspect and worse with walking, stair climbing, standing for a long time as well as going from a seated to standing position.  She has associated giving way sensations.  She has been taking gabapentin as well as a recent steroid taper which has helped.  No previous viscosupplementation injection.  Review of Systems as detailed in HPI.  All others reviewed and are negative.   Objective: Vital Signs: There were no vitals taken for this visit.  Physical Exam well-developed well-nourished female no acute distress.  Alert and oriented x 3.  Ortho Exam right knee exam shows a small effusion.  Range of motion 0 to 95 degrees.  Moderate lateral joint line tenderness.  Mild patellofemoral crepitus.  Ligaments are stable.  She is neurovascular intact distally.  Specialty Comments:  No specialty comments  available.  Imaging: XR KNEE 3 VIEW RIGHT  Result Date: 12/04/2021 Moderate tricompartmental degenerative changes    PMFS History: Patient Active Problem List   Diagnosis Date Noted   Skin lesion 07/28/2021   Intrinsic atopic dermatitis 07/28/2021   Balance problem 07/05/2021   Gait abnormality 07/05/2021   Benign neoplasm of scalp 07/05/2021   Stress at home 06/08/2019   Animal bite of finger 10/07/2018   Acute pain of right knee 11/09/2017   Knee  swelling 11/09/2017   Restless leg syndrome 07/28/2017   Low serum creatine 06/30/2017   Acute bacterial conjunctivitis of left eye 06/30/2017   Insomnia 04/01/2017   Cerebral aneurysm    Stroke ([place]) 03/15/2017   Paresthesia of both hands 03/09/2017   H/O TIA (transient ischemic attack) and stroke 03/09/2017   Fibromyalgia 01/22/2017   Breast pain, left 12/15/2016   Generalized weakness 12/15/2016   Leg pain, bilateral 12/15/2016   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   Hoarseness 10/08/2016   Laryngopharyngeal reflux (LPR) 10/08/2016   Centrilobular emphysema ([place]) 09/09/2016   GERD (gastroesophageal reflux disease) 06/30/2016   Dysphagia 06/30/2016   Health care maintenance 06/25/2016   Other fatigue 05/26/2016   Hypertension 05/26/2016   Other hyperlipidemia 05/26/2016   Family history of diabetes mellitus in mother 05/26/2016   Acute pain of right shoulder 05/26/2016   Tobacco use disorder 05/26/2016   Dyspnea on exertion 05/26/2016   Chronic pain of right knee 05/26/2016   History of MI (myocardial infarction) 05/26/2016   History of TIA (transient ischemic attack) 05/26/2016   Numbness and tingling of left lower extremity 05/26/2016   Numbness and tingling in left hand 05/26/2016   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   Past Medical History:  Diagnosis Date   Allergy    Anginal pain ([place])    Arthritis    Astigmatism    Atypical mole 2018   left nose tx mohs   Cancer ([place])    skin on nose   Closed fracture of right patella    Emphysema of lung ([facility])    GERD (gastroesophageal reflux disease)    Heart attack ([place]) 1999   Hypertension    Osteopenia 12/2016   T score -1.2 FRAX 7.4%/0.8%   Seborrheic keratoses    Squamous cell carcinoma of skin 04/16/2021   in situ- right inner malar cheek (CX35FU)   Stroke ([facility])    TIA x 2, early 2000's    Vocal cord polyp     Family History  Problem Relation Age of Onset   Diabetes Mother    Hypertension Mother    Stroke Mother     Alcohol abuse Father    Multiple sclerosis Sister    Cancer Brother        lung   Stroke Maternal Grandmother    Heart attack Maternal Grandmother    Hypertension Maternal Grandmother    Colon cancer Neg Hx    Colon polyps Neg Hx    Esophageal cancer Neg Hx    Stomach cancer Neg Hx    Rectal cancer Neg Hx     Past Surgical History:  Procedure Laterality Date   BREAST BIOPSY     BREAST SURGERY     Left Breast nodule markers   CARDIAC CATHETERIZATION     1999   caridac cath     LOOP RECORDER INSERTION N/A 03/17/2017   Procedure: LOOP RECORDER INSERTION;  Surgeon: Evans Lance, MD;  Location: [facility];  Service: Cardiovascular;  Laterality:  N/A;   lt. ovary removed     MICROLARYNGOSCOPY WITH LASER Bilateral 11/13/2016   Procedure: MICROLARYNGOSCOPY;  Surgeon: Melissa Montane, MD;  Location: [facility] OR;  Service: ENT;  Laterality: Bilateral;   polyp removal  10/2016   throat -    SKIN BIOPSY     nose   TEE WITHOUT CARDIOVERSION N/A 03/17/2017   Procedure: TRANSESOPHAGEAL ECHOCARDIOGRAM (TEE);  Surgeon: Dorothy Spark, MD;  Location: [facility] ENDOSCOPY;  Service: Cardiovascular;  Laterality: N/A;   TUBAL LIGATION     Social History   Occupational History   Not on file  Tobacco Use   Smoking status: Former    Packs/day: 0.50    Years: 40.00    Total pack years: 20.00    Types: Cigarettes    Start date: 09/27/1976    Quit date: 12/26/2018    Years since quitting: 2.9   Smokeless tobacco: Never   Tobacco comments:    Peak rate of 1ppd  Vaping Use   Vaping Use: Never used  Substance and Sexual Activity   Alcohol use: No   Drug use: No   Sexual activity: Never

## 2021-12-04 MED ORDER — METHYLPREDNISOLONE ACETATE 40 MG/ML IJ SUSP
40.0000 mg | INTRAMUSCULAR | Status: AC | PRN
  Administered 2021-12-03: 40 mg via INTRA_ARTICULAR

## 2021-12-04 MED ORDER — LIDOCAINE HCL 1 % IJ SOLN
2.0000 mL | INTRAMUSCULAR | Status: AC | PRN
  Administered 2021-12-03: 2 mL

## 2021-12-04 MED ORDER — BUPIVACAINE HCL 0.25 % IJ SOLN
2.0000 mL | INTRAMUSCULAR | Status: AC | PRN
  Administered 2021-12-03: 2 mL via INTRA_ARTICULAR

## 2021-12-04 NOTE — Telephone Encounter (Signed)
VOB submitted for Monovisc, right knee. 

## 2021-12-18 ENCOUNTER — Ambulatory Visit: Payer: Medicare HMO | Admitting: Physician Assistant

## 2021-12-19 ENCOUNTER — Other Ambulatory Visit: Payer: Self-pay

## 2021-12-19 DIAGNOSIS — M1711 Unilateral primary osteoarthritis, right knee: Secondary | ICD-10-CM

## 2022-01-01 ENCOUNTER — Other Ambulatory Visit: Payer: Self-pay | Admitting: Physician Assistant

## 2022-01-01 ENCOUNTER — Encounter: Payer: Self-pay | Admitting: Orthopaedic Surgery

## 2022-01-01 ENCOUNTER — Ambulatory Visit: Payer: Medicare HMO | Admitting: Orthopaedic Surgery

## 2022-01-01 DIAGNOSIS — M1711 Unilateral primary osteoarthritis, right knee: Secondary | ICD-10-CM

## 2022-01-01 DIAGNOSIS — R6889 Other general symptoms and signs: Secondary | ICD-10-CM | POA: Diagnosis not present

## 2022-01-01 MED ORDER — BUPIVACAINE HCL 0.25 % IJ SOLN
2.0000 mL | INTRAMUSCULAR | Status: AC | PRN
  Administered 2022-01-01: 2 mL via INTRA_ARTICULAR

## 2022-01-01 MED ORDER — LIDOCAINE HCL 1 % IJ SOLN
2.0000 mL | INTRAMUSCULAR | Status: AC | PRN
  Administered 2022-01-01: 2 mL

## 2022-01-01 MED ORDER — HYALURONAN 88 MG/4ML IX SOSY
88.0000 mg | PREFILLED_SYRINGE | INTRA_ARTICULAR | Status: AC | PRN
  Administered 2022-01-01: 88 mg via INTRA_ARTICULAR

## 2022-01-01 NOTE — Progress Notes (Signed)
Office Visit Note   Patient: Heidi Foster           Date of Birth: December 07, 1955           MRN: [id number] Visit Date: 01/01/2022              Requested by: Lorrene Reid, PA-C [address],  [place] [address] PCP: Lorrene Reid, PA-C   Assessment & Plan: Visit Diagnoses:  1. Primary osteoarthritis of right knee     Plan: Impression is right knee osteoarthritis.  Today, proceed with right knee Monovisc injection.  She tolerated this well.  She will follow-up with [place] as needed. Lot #[id number]: Expiration date 06/25/2024  Follow-Up Instructions: No follow-ups on file.   Orders:  No orders of the defined types were placed in this encounter.  No orders of the defined types were placed in this encounter.     Procedures: Large Joint Inj: R knee on 01/01/2022 8:28 AM Indications: pain Details: 22 G needle, anterolateral approach Medications: 2 mL lidocaine 1 %; 2 mL bupivacaine 0.25 %; 88 mg Hyaluronan 88 MG/4ML      Clinical Data: No additional findings.   Subjective: Chief Complaint  Patient presents with   Right Knee - Follow-up    monovisc    HPI patient is a pleasant 66 year old female who comes in today for right knee Monovisc injection.  History of underlying right knee osteoarthritis.  She has previously undergone cortisone injections without long-lasting relief.  No previous viscosupplementation injection.     Objective: Vital Signs: There were no vitals taken for this visit.    Ortho Exam unchanged right knee exam  Specialty Comments:  No specialty comments available.  Imaging: No new imaging   PMFS History: Patient Active Problem List   Diagnosis Date Noted   Skin lesion 07/28/2021   Intrinsic atopic dermatitis 07/28/2021   Balance problem 07/05/2021   Gait abnormality 07/05/2021   Benign neoplasm of scalp 07/05/2021   Stress at home 06/08/2019   Animal bite of finger 10/07/2018   Acute pain of right knee 11/09/2017    Knee swelling 11/09/2017   Restless leg syndrome 07/28/2017   Low serum creatine 06/30/2017   Acute bacterial conjunctivitis of left eye 06/30/2017   Insomnia 04/01/2017   Cerebral aneurysm    Stroke ([place]) 03/15/2017   Paresthesia of both hands 03/09/2017   H/O TIA (transient ischemic attack) and stroke 03/09/2017   Fibromyalgia 01/22/2017   Breast pain, left 12/15/2016   Generalized weakness 12/15/2016   Leg pain, bilateral 12/15/2016   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   Hoarseness 10/08/2016   Laryngopharyngeal reflux (LPR) 10/08/2016   Centrilobular emphysema ([place]) 09/09/2016   GERD (gastroesophageal reflux disease) 06/30/2016   Dysphagia 06/30/2016   Health care maintenance 06/25/2016   Other fatigue 05/26/2016   Hypertension 05/26/2016   Other hyperlipidemia 05/26/2016   Family history of diabetes mellitus in mother 05/26/2016   Acute pain of right shoulder 05/26/2016   Tobacco use disorder 05/26/2016   Dyspnea on exertion 05/26/2016   Chronic pain of right knee 05/26/2016   History of MI (myocardial infarction) 05/26/2016   History of TIA (transient ischemic attack) 05/26/2016   Numbness and tingling of left lower extremity 05/26/2016   Numbness and tingling in left hand 05/26/2016   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   Past Medical History:  Diagnosis Date   Allergy    Anginal pain ([place])    Arthritis    Astigmatism  Atypical mole 2018   left nose tx mohs   Cancer ([place])    skin on nose   Closed fracture of right patella    Emphysema of lung ([facility])    GERD (gastroesophageal reflux disease)    Heart attack ([place]) 1999   Hypertension    Osteopenia 12/2016   T score -1.2 FRAX 7.4%/0.8%   Seborrheic keratoses    Squamous cell carcinoma of skin 04/16/2021   in situ- right inner malar cheek (CX35FU)   Stroke ([facility])    TIA x 2, early 2000's    Vocal cord polyp     Family History  Problem Relation Age of Onset   Diabetes Mother    Hypertension Mother    Stroke Mother     Alcohol abuse Father    Multiple sclerosis Sister    Cancer Brother        lung   Stroke Maternal Grandmother    Heart attack Maternal Grandmother    Hypertension Maternal Grandmother    Colon cancer Neg Hx    Colon polyps Neg Hx    Esophageal cancer Neg Hx    Stomach cancer Neg Hx    Rectal cancer Neg Hx     Past Surgical History:  Procedure Laterality Date   BREAST BIOPSY     BREAST SURGERY     Left Breast nodule markers   CARDIAC CATHETERIZATION     1999   caridac cath     LOOP RECORDER INSERTION N/A 03/17/2017   Procedure: LOOP RECORDER INSERTION;  Surgeon: Evans Lance, MD;  Location: [facility];  Service: Cardiovascular;  Laterality: N/A;   lt. ovary removed     MICROLARYNGOSCOPY WITH LASER Bilateral 11/13/2016   Procedure: MICROLARYNGOSCOPY;  Surgeon: Melissa Montane, MD;  Location: [facility];  Service: ENT;  Laterality: Bilateral;   polyp removal  10/2016   throat -    SKIN BIOPSY     nose   TEE WITHOUT CARDIOVERSION N/A 03/17/2017   Procedure: TRANSESOPHAGEAL ECHOCARDIOGRAM (TEE);  Surgeon: Dorothy Spark, MD;  Location: [facility];  Service: Cardiovascular;  Laterality: N/A;   TUBAL LIGATION     Social History   Occupational History   Not on file  Tobacco Use   Smoking status: Former    Packs/day: 0.50    Years: 40.00    Total pack years: 20.00    Types: Cigarettes    Start date: 09/27/1976    Quit date: 12/26/2018    Years since quitting: 3.0   Smokeless tobacco: Never   Tobacco comments:    Peak rate of 1ppd  Vaping Use   Vaping Use: Never used  Substance and Sexual Activity   Alcohol use: No   Drug use: No   Sexual activity: Never

## 2022-01-12 ENCOUNTER — Other Ambulatory Visit: Payer: Self-pay | Admitting: Physician Assistant

## 2022-01-13 ENCOUNTER — Other Ambulatory Visit: Payer: Self-pay | Admitting: Nurse Practitioner

## 2022-01-13 DIAGNOSIS — L2084 Intrinsic (allergic) eczema: Secondary | ICD-10-CM

## 2022-01-14 ENCOUNTER — Other Ambulatory Visit: Payer: Self-pay

## 2022-01-14 DIAGNOSIS — Z0000 Encounter for general adult medical examination without abnormal findings: Secondary | ICD-10-CM

## 2022-01-14 DIAGNOSIS — E7849 Other hyperlipidemia: Secondary | ICD-10-CM

## 2022-01-14 DIAGNOSIS — I10 Essential (primary) hypertension: Secondary | ICD-10-CM

## 2022-01-15 ENCOUNTER — Other Ambulatory Visit: Payer: Medicare HMO

## 2022-01-15 DIAGNOSIS — Z0000 Encounter for general adult medical examination without abnormal findings: Secondary | ICD-10-CM | POA: Diagnosis not present

## 2022-01-15 DIAGNOSIS — I10 Essential (primary) hypertension: Secondary | ICD-10-CM

## 2022-01-15 DIAGNOSIS — E7849 Other hyperlipidemia: Secondary | ICD-10-CM

## 2022-01-16 LAB — COMPREHENSIVE METABOLIC PANEL
ALT: 17 IU/L (ref 0–32)
AST: 20 IU/L (ref 0–40)
Albumin/Globulin Ratio: 2.4 — ABNORMAL HIGH (ref 1.2–2.2)
Albumin: 4.6 g/dL (ref 3.9–4.9)
Alkaline Phosphatase: 116 IU/L (ref 44–121)
BUN/Creatinine Ratio: 20 (ref 12–28)
BUN: 13 mg/dL (ref 8–27)
Bilirubin Total: 0.4 mg/dL (ref 0.0–1.2)
CO2: 23 mmol/L (ref 20–29)
Calcium: 10.1 mg/dL (ref 8.7–10.3)
Chloride: 101 mmol/L (ref 96–106)
Creatinine, Ser: 0.66 mg/dL (ref 0.57–1.00)
Globulin, Total: 1.9 g/dL (ref 1.5–4.5)
Glucose: 76 mg/dL (ref 70–99)
Potassium: 4.1 mmol/L (ref 3.5–5.2)
Sodium: 141 mmol/L (ref 134–144)
Total Protein: 6.5 g/dL (ref 6.0–8.5)
eGFR: 97 mL/min/{1.73_m2} (ref >59)

## 2022-01-16 LAB — HEMOGLOBIN A1C
Est. average glucose Bld gHb Est-mCnc: 123 mg/dL
Hgb A1c MFr Bld: 5.9 % — ABNORMAL HIGH (ref 4.8–5.6)

## 2022-01-16 LAB — CBC WITH DIFFERENTIAL/PLATELET
Basophils Absolute: 0 10*3/uL (ref 0.0–0.2)
Basos: 0 %
EOS (ABSOLUTE): 0.1 10*3/uL (ref 0.0–0.4)
Eos: 1 %
Hematocrit: 41.4 % (ref 34.0–46.6)
Hemoglobin: 13.6 g/dL (ref 11.1–15.9)
Immature Grans (Abs): 0 10*3/uL (ref 0.0–0.1)
Immature Granulocytes: 0 %
Lymphocytes Absolute: 2.2 10*3/uL (ref 0.7–3.1)
Lymphs: 25 %
MCH: 30.6 pg (ref 26.6–33.0)
MCHC: 32.9 g/dL (ref 31.5–35.7)
MCV: 93 fL (ref 79–97)
Monocytes Absolute: 0.7 10*3/uL (ref 0.1–0.9)
Monocytes: 8 %
Neutrophils Absolute: 5.6 10*3/uL (ref 1.4–7.0)
Neutrophils: 66 %
Platelets: 311 10*3/uL (ref 150–450)
RBC: 4.45 x10E6/uL (ref 3.77–5.28)
RDW: 12.7 % (ref 11.7–15.4)
WBC: 8.5 10*3/uL (ref 3.4–10.8)

## 2022-01-16 LAB — TSH: TSH: 1.04 u[IU]/mL (ref 0.450–4.500)

## 2022-01-16 LAB — LIPID PANEL
Chol/HDL Ratio: 3.3 ratio (ref 0.0–4.4)
Cholesterol, Total: 159 mg/dL (ref 100–199)
HDL: 48 mg/dL (ref >39)
LDL Chol Calc (NIH): 88 mg/dL (ref 0–99)
Triglycerides: 132 mg/dL (ref 0–149)
VLDL Cholesterol Cal: 23 mg/dL (ref 5–40)

## 2022-01-21 ENCOUNTER — Ambulatory Visit (INDEPENDENT_AMBULATORY_CARE_PROVIDER_SITE_OTHER): Payer: Medicare HMO | Admitting: Nurse Practitioner

## 2022-01-21 ENCOUNTER — Encounter: Payer: Self-pay | Admitting: Nurse Practitioner

## 2022-01-21 VITALS — BP 103/62 | HR 70 | Ht 61.0 in | Wt 141.4 lb

## 2022-01-21 DIAGNOSIS — F409 Phobic anxiety disorder, unspecified: Secondary | ICD-10-CM

## 2022-01-21 DIAGNOSIS — F5105 Insomnia due to other mental disorder: Secondary | ICD-10-CM | POA: Diagnosis not present

## 2022-01-21 DIAGNOSIS — Z1231 Encounter for screening mammogram for malignant neoplasm of breast: Secondary | ICD-10-CM

## 2022-01-21 DIAGNOSIS — Z85828 Personal history of other malignant neoplasm of skin: Secondary | ICD-10-CM | POA: Diagnosis not present

## 2022-01-21 DIAGNOSIS — I10 Essential (primary) hypertension: Secondary | ICD-10-CM

## 2022-01-21 DIAGNOSIS — Z0000 Encounter for general adult medical examination without abnormal findings: Secondary | ICD-10-CM

## 2022-01-21 DIAGNOSIS — Z1211 Encounter for screening for malignant neoplasm of colon: Secondary | ICD-10-CM

## 2022-01-21 DIAGNOSIS — E7849 Other hyperlipidemia: Secondary | ICD-10-CM | POA: Diagnosis not present

## 2022-01-21 DIAGNOSIS — R6889 Other general symptoms and signs: Secondary | ICD-10-CM | POA: Diagnosis not present

## 2022-01-21 MED ORDER — TRAZODONE HCL 50 MG PO TABS: 25.0000 mg | ORAL_TABLET | Freq: Every evening | ORAL | 1 refills | Status: DC | PRN

## 2022-01-21 NOTE — Progress Notes (Signed)
Subjective:   Heidi Foster is a 66 y.o. female who presents for Medicare Annual (Subsequent) preventive examination. -incresaed personal stress -routine, fasting labs done prior to this visit -HgbA1c 5.7 with normal glucose of 74 -normal lipid panel -other labs are esentially normal  -blood pressure well managed  -the patient has history of skin cancer. Dermatologist she was seeing has closed. The patient does need new dermatology referral.   Review of Systems    Review of Systems  Constitutional:  Positive for malaise/fatigue. Negative for chills and fever.       Patient not sleeping well.   HENT:  Negative for congestion, sinus pain and sore throat.   Eyes: Negative.   Respiratory:  Negative for cough, shortness of breath and wheezing.   Cardiovascular:  Negative for chest pain, palpitations and leg swelling.  Gastrointestinal:  Negative for constipation, diarrhea, nausea and vomiting.  Genitourinary: Negative.   Musculoskeletal:  Negative for myalgias.  Skin: Negative.   Neurological:  Negative for dizziness and headaches.  Endo/Heme/Allergies:  Does not bruise/bleed easily.  Psychiatric/Behavioral:  Negative for depression. The patient is not nervous/anxious.           Objective:    Today's Vitals   01/21/22 0818  BP: 103/62  Pulse: 70  SpO2: 97%  Weight: 141 lb 6.4 oz (64.1 kg)  Height: '5\' 1"'$  (1.549 m)   Body mass index is 26.72 kg/m.    Row Labels 03/31/2017    9:54 AM 03/15/2017   10:11 PM 03/15/2017    4:51 PM 02/09/2017    3:14 PM 11/05/2016    1:21 PM 05/26/2016   10:52 AM 04/26/2015    6:49 PM  Advanced Directives   Section Header. No data exists in this row.         Does Patient Have a Medical Advance Directive?   No No No No No No No  Would patient like information on creating a medical advance directive?   No - Patient declined No - Patient declined   No - Patient declined Yes (MAU/Ambulatory/Procedural Areas - Information given) No - patient declined  information    Current Medications (verified) Outpatient Encounter Medications as of 01/21/2022  Medication Sig   albuterol (PROVENTIL HFA;VENTOLIN HFA) 108 (90 Base) MCG/ACT inhaler Inhale 2 puffs into the lungs every 4 (four) hours as needed for wheezing or shortness of breath.   amLODipine (NORVASC) 10 MG tablet TAKE 1 TABLET EVERY DAY   atorvastatin (LIPITOR) 40 MG tablet TAKE 1 TABLET EVERY DAY   clopidogrel (PLAVIX) 75 MG tablet TAKE 1 TABLET EVERY DAY   DULoxetine (CYMBALTA) 30 MG capsule TAKE 3 CAPSULES EVERY DAY   gabapentin (NEURONTIN) 300 MG capsule Take 2 capsules (600 mg total) by mouth at bedtime. TAKE 3 CAPSULE BY MOUTH AT  BEDTIME   losartan (COZAAR) 100 MG tablet TAKE 1 TABLET EVERY DAY   methylPREDNISolone (MEDROL DOSEPAK) 4 MG TBPK tablet Take as directed on package.   Misc. Devices (QUAD CANE) MISC Use when walking for balance and mobility assistance   Multiple Vitamin (MULTIVITAMIN WITH MINERALS) TABS tablet Take 1 tablet daily by mouth.   Spacer/Aero-Holding Chambers (AEROCHAMBER MV) inhaler Use as instructed   traZODone (DESYREL) 50 MG tablet Take 0.5-1 tablets (25-50 mg total) by mouth at bedtime as needed for sleep.   triamcinolone cream (KENALOG) 0.1 % APPLY 1 APPLICATION TOPICALLY TO AFFECTED AREA(S) TWICE DAILY   umeclidinium-vilanterol (ANORO ELLIPTA) 62.5-25 MCG/INH AEPB Inhale 1 puff daily into the lungs.  No facility-administered encounter medications on file as of 01/21/2022.    Allergies (verified) Codeine and Bupropion   History: Past Medical History:  Diagnosis Date   Allergy    Anginal pain ([place])    Arthritis    Astigmatism    Atypical mole 2018   left nose tx mohs   Cancer ([facility])    skin on nose   Closed fracture of right patella    Emphysema of lung ([facility])    GERD (gastroesophageal reflux disease)    Heart attack ([place]) 1999   Hypertension    Osteopenia 12/2016   T score -1.2 FRAX 7.4%/0.8%   Seborrheic keratoses    Squamous cell  carcinoma of skin 04/16/2021   in situ- right inner malar cheek (CX35FU)   Stroke ([facility])    TIA x 2, early 2000's    Vocal cord polyp    Past Surgical History:  Procedure Laterality Date   BREAST BIOPSY     BREAST SURGERY     Left Breast nodule markers   CARDIAC CATHETERIZATION     1999   caridac cath     LOOP RECORDER INSERTION N/A 03/17/2017   Procedure: LOOP RECORDER INSERTION;  Surgeon: Evans Lance, MD;  Location: [facility];  Service: Cardiovascular;  Laterality: N/A;   lt. ovary removed     MICROLARYNGOSCOPY WITH LASER Bilateral 11/13/2016   Procedure: MICROLARYNGOSCOPY;  Surgeon: Melissa Montane, MD;  Location: [facility];  Service: ENT;  Laterality: Bilateral;   polyp removal  10/2016   throat -    SKIN BIOPSY     nose   TEE WITHOUT CARDIOVERSION N/A 03/17/2017   Procedure: TRANSESOPHAGEAL ECHOCARDIOGRAM (TEE);  Surgeon: Dorothy Spark, MD;  Location: [facility];  Service: Cardiovascular;  Laterality: N/A;   TUBAL LIGATION     Family History  Problem Relation Age of Onset   Diabetes Mother    Hypertension Mother    Stroke Mother    Alcohol abuse Father    Multiple sclerosis Sister    Cancer Brother        lung   Stroke Maternal Grandmother    Heart attack Maternal Grandmother    Hypertension Maternal Grandmother    Colon cancer Neg Hx    Colon polyps Neg Hx    Esophageal cancer Neg Hx    Stomach cancer Neg Hx    Rectal cancer Neg Hx    Social History   Socioeconomic History   Marital status: Divorced    Spouse name: Not on file   Number of children: Not on file   Years of education: Not on file   Highest education level: Not on file  Occupational History   Not on file  Tobacco Use   Smoking status: Former    Packs/day: 0.50    Years: 40.00    Total pack years: 20.00    Types: Cigarettes    Start date: 09/27/1976    Quit date: 12/26/2018    Years since quitting: 3.1   Smokeless tobacco: Never   Tobacco comments:    Peak rate of 1ppd   Vaping Use   Vaping Use: Never used  Substance and Sexual Activity   Alcohol use: No   Drug use: No   Sexual activity: Never  Other Topics Concern   Not on file  Social History Narrative   [facility] (06/30/16):   Originally from [place]. Moved to [facility] in April 2016. 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 Currently lives with her daughter & son-in-law in a mobile home. Has a cat currently. No mold or TB exposure. Always had a negative skin PPD. Previously enjoyed crocheting.    Social Determinants of Health   Financial Resource Strain: Not on file  Food Insecurity: Not on file  Transportation Needs: Not on file  Physical Activity: Not on file  Stress: Not on file  Social Connections: Not on file   Physical Exam Vitals and nursing note reviewed.  Constitutional:      Appearance: Normal appearance. She is well-developed.  HENT:     Head: Normocephalic and atraumatic.     Right Ear: Tympanic membrane, ear canal and external ear normal.     Left Ear: Tympanic membrane, ear canal and external ear normal.     Nose: Nose normal.     Mouth/Throat:     Mouth: Mucous membranes are moist.     Pharynx: Oropharynx is clear.  Eyes:     Extraocular Movements: Extraocular movements intact.     Conjunctiva/sclera: Conjunctivae normal.     Pupils: Pupils are equal, round, and reactive to light.  Neck:     Vascular: No carotid bruit.  Cardiovascular:     Rate and Rhythm: Normal rate and regular rhythm.     Pulses: Normal pulses.     Heart sounds: Normal heart sounds.  Pulmonary:     Effort: Pulmonary effort is normal.     Breath sounds: Normal breath sounds.  Abdominal:     General: Bowel sounds are normal. There is no distension.     Palpations: Abdomen is soft. There is no mass.     Tenderness: There is no abdominal tenderness. There is no right CVA tenderness, left CVA tenderness or rebound.     Hernia: No hernia is present.  Musculoskeletal:         General: Normal range of motion.     Cervical back: Normal range of motion and neck supple.  Lymphadenopathy:     Cervical: No cervical adenopathy.  Skin:    General: Skin is warm and dry.     Capillary Refill: Capillary refill takes less than 2 seconds.  Neurological:     General: No focal deficit present.     Mental Status: She is alert and oriented to person, place, and time.  Psychiatric:        Mood and Affect: Mood normal.        Behavior: Behavior normal.        Thought Content: Thought content normal.        Judgment: Judgment normal.      Tobacco Counseling Counseling given: Not Answered Tobacco comments: Peak rate of 1ppd   Diabetic?no   Activities of Daily Living   Row Labels 01/21/2022    8:26 AM 11/26/2021    1:18 PM  In your present state of health, do you have any difficulty performing the following activities:   Section Header. No data exists in this row.    Hearing?   0 0  Vision?   0 0  Difficulty concentrating or making decisions?   0 0  Walking or climbing stairs?   1 1  Dressing or bathing?   0 0  Doing errands, shopping?   0 0    Patient Care Team: Lorrene Reid, PA-C as PCP - General (Physician Assistant) Warren Danes, PA-C as Physician Assistant (Dermatology)  Indicate any recent Medical Services you may have  received from other than [facility] providers in the past year (date may be approximate).     Assessment:  1. Encounter for Medicare annual wellness exam Annual Medicare wellness visit today.  2. Hypertension, unspecified type Blood pressure stable.  Continue medication as prescribed.  3. Other hyperlipidemia Lipid panel within normal limits.  Continue atorvastatin as prescribed.  4. Insomnia due to anxiety and fear Trial of trazodone 50 mg.  May take 1/2 to 1 tablet at bedtime as needed for insomnia. - traZODone (DESYREL) 50 MG tablet; Take 0.5-1 tablets (25-50 mg total) by mouth at bedtime as needed for sleep.  Dispense: 90  tablet; Refill: 1  5. History of malignant neoplasm of skin Referral to dermatology for annual skin check. - Ambulatory referral to Dermatology  6. Screening for colon cancer Cologuard test ordered during today's visit. - Cologuard  7. Encounter for screening mammogram for malignant neoplasm of breast Screening mammogram ordered at today's visit. - MM DIGITAL SCREENING BILATERAL; Future      Depression Screen   Row Labels 01/21/2022    8:26 AM 11/26/2021    1:17 PM 07/19/2021    9:45 AM 07/05/2021    9:00 AM 03/05/2021    8:49 AM 02/05/2021    8:58 AM 10/05/2020    8:50 AM  PHQ 2/9 Scores   Section Header. No data exists in this row.         PHQ - 2 Score   '2 2 1 2 3 4 '$ 0  PHQ- 9 Score   '6 6 5 8 8 16 5    '$ Fall Risk   Row Labels 11/26/2021    1:17 PM 07/19/2021    9:46 AM 03/05/2021    8:49 AM 02/05/2021    8:59 AM 10/05/2020    8:49 AM  Fall Risk    Section Header. No data exists in this row.       Falls in the past year?   1 0 0 0 0  Number falls in past yr:   1 0 0 0 0  Injury with Fall?   1 0 0 0 0  Risk for fall due to :   Impaired balance/gait;Impaired mobility  No Fall Risks  No Fall Risks  Follow up   Falls evaluation completed Falls evaluation completed Falls evaluation completed Falls evaluation completed Falls evaluation completed    [place]:  Any stairs in or around the home? No  If so, are there any without handrails? No  Home free of loose throw rugs in walkways, pet beds, electrical cords, etc? Yes  Adequate lighting in your home to reduce risk of falls? Yes   ASSISTIVE DEVICES UTILIZED TO PREVENT FALLS:  Life alert? No  Use of a cane, walker or w/c? Yes  Grab bars in the bathroom? Yes  Shower chair or bench in shower? Yes  Elevated toilet seat or a handicapped toilet? No   TIMED UP AND GO:  Was the test performed? Yes .  Length of time to ambulate 10 feet: 15 sec.   Gait slow and steady with assistive  device  Cognitive Function:       Row Labels 01/21/2022    8:26 AM  6CIT Screen   Section Header. No data exists in this row.   What time?   0 points  Count back from 20   0 points  Months in reverse   0 points  Repeat phrase   2 points  Immunizations Immunization History  Administered Date(s) Administered   Influenza,inj,Quad PF,6+ Mos 02/04/2017, 01/06/2019, 04/17/2020   Influenza-Unspecified 12/27/2021   PFIZER(Purple Top)SARS-COV-2 Vaccination 07/19/2019, 08/09/2019, 04/02/2020   Pfizer Covid-19 Vaccine Bivalent Booster 43yr & up 12/27/2021   Pneumococcal Polysaccharide-23 06/25/2016   Rsv, Bivalent, Protein Subunit Rsvpref,pf (Evans Lance 12/27/2021   Tdap 06/25/2016   Zoster Recombinat (Shingrix) 12/27/2021    TDAP status: Up to date  Flu Vaccine status: Up to date  Pneumococcal vaccine status: Up to date  Covid-19 vaccine status: Completed vaccines  Qualifies for Shingles Vaccine? Yes   Zostavax completed No   Shingrix Completed?: Yes  Screening Tests Health Maintenance  Topic Date Due   Pneumonia Vaccine 65 Years old (2 - PCV) 06/25/2017   MAMMOGRAM  08/14/2018   Fecal DNA (Cologuard)  01/17/2022   COVID-19 Vaccine (5 - Pfizer risk series) 02/21/2022   Zoster Vaccines- Shingrix (2 of 2) 02/21/2022   TETANUS/TDAP  06/25/2026   INFLUENZA VACCINE  Completed   DEXA SCAN  Completed   Hepatitis C Screening  Completed   HIV Screening  Completed   HPV VACCINES  Aged Out   PAP SMEAR-Modifier  Discontinued    Health Maintenance  Health Maintenance Due  Topic Date Due   Pneumonia Vaccine 65 Years old (2 - PCV) 06/25/2017   MAMMOGRAM  08/14/2018   Fecal DNA (Cologuard)  01/17/2022    Colorectal cancer screening: Type of screening: Cologuard. Completed 2023. Repeat every 3 years  Mammogram status: Ordered 01/21/2022. Pt provided with contact info and advised to call to schedule appt.   Bone Density status: Completed 01/19/2017. Results reflect: Bone  density results: NORMAL. Repeat every 2 years.  Lung Cancer Screening: (Low Dose CT Chest recommended if Age 65-80years, 30 pack-year currently smoking OR have quit w/in 15years.) does not qualify.   Lung Cancer Screening Referral: patient declined  Additional Screening:  Hepatitis C Screening: does not qualify; Completed patient declined  Vision Screening: Recommended annual ophthalmology exams for early detection of glaucoma and other disorders of the eye. Is the patient up to date with their annual eye exam?  Yes  Who is the provider or what is the name of the office in which the patient attends annual [facility] exams[facility] provider, would they like to be referred to a provider to establish care? No .   Dental Screening: Recommended annual dental exams for proper oral hygiene  Community Resource Referral / Chronic Care Management: CRR required this visit?  No   CCM required this visit?  No      Plan:     I have personally reviewed and noted the following in the patient's chart:   Medical and social history Use of alcohol, tobacco or illicit drugs  Current medications and supplements including opioid prescriptions. Patient is not currently taking opioid prescriptions. Functional ability and status Nutritional status Physical activity Advanced directives List of other physicians Hospitalizations, surgeries, and ER visits in previous 12 months Vitals Screenings to include cognitive, depression, and falls Referrals and appointments  In addition, I have reviewed and discussed with patient certain preventive protocols, quality metrics, and best practice recommendations. A written personalized care plan for preventive services as well as general preventive health recommendations were provided to patient.     HRonnell Freshwater NP   02/02/2022

## 2022-01-22 ENCOUNTER — Telehealth: Payer: Self-pay

## 2022-01-22 NOTE — Telephone Encounter (Signed)
Documented patient vaccines from [facility]

## 2022-01-22 NOTE — Telephone Encounter (Signed)
Patient called office to inform that she got her cv19 vaccines at [facility] on [address], please advise, thanks!

## 2022-02-02 DIAGNOSIS — Z85828 Personal history of other malignant neoplasm of skin: Secondary | ICD-10-CM | POA: Insufficient documentation

## 2022-02-02 DIAGNOSIS — F409 Phobic anxiety disorder, unspecified: Secondary | ICD-10-CM | POA: Insufficient documentation

## 2022-02-02 DIAGNOSIS — F5105 Insomnia due to other mental disorder: Secondary | ICD-10-CM | POA: Insufficient documentation

## 2022-02-03 ENCOUNTER — Other Ambulatory Visit: Payer: Self-pay | Admitting: Nurse Practitioner

## 2022-02-03 DIAGNOSIS — G2581 Restless legs syndrome: Secondary | ICD-10-CM

## 2022-02-04 DIAGNOSIS — Z1211 Encounter for screening for malignant neoplasm of colon: Secondary | ICD-10-CM | POA: Diagnosis not present

## 2022-02-10 NOTE — Progress Notes (Signed)
Labs were reviewed with patient at her recent visit .

## 2022-02-11 ENCOUNTER — Encounter: Payer: Self-pay | Admitting: Nurse Practitioner

## 2022-02-11 LAB — COLOGUARD: COLOGUARD: NEGATIVE

## 2022-03-03 DIAGNOSIS — M9903 Segmental and somatic dysfunction of lumbar region: Secondary | ICD-10-CM | POA: Diagnosis not present

## 2022-03-03 DIAGNOSIS — M5417 Radiculopathy, lumbosacral region: Secondary | ICD-10-CM | POA: Diagnosis not present

## 2022-03-03 DIAGNOSIS — M9901 Segmental and somatic dysfunction of cervical region: Secondary | ICD-10-CM | POA: Diagnosis not present

## 2022-03-03 DIAGNOSIS — M62830 Muscle spasm of back: Secondary | ICD-10-CM | POA: Diagnosis not present

## 2022-03-03 DIAGNOSIS — R6889 Other general symptoms and signs: Secondary | ICD-10-CM | POA: Diagnosis not present

## 2022-03-03 DIAGNOSIS — M5413 Radiculopathy, cervicothoracic region: Secondary | ICD-10-CM | POA: Diagnosis not present

## 2022-03-12 ENCOUNTER — Other Ambulatory Visit: Payer: Self-pay | Admitting: Physician Assistant

## 2022-03-12 DIAGNOSIS — F419 Anxiety disorder, unspecified: Secondary | ICD-10-CM

## 2022-03-12 DIAGNOSIS — F321 Major depressive disorder, single episode, moderate: Secondary | ICD-10-CM

## 2022-03-12 NOTE — Telephone Encounter (Signed)
90 day supply with 10 refills was sent to the pharmacy on 10/17/21

## 2022-03-31 DIAGNOSIS — R6889 Other general symptoms and signs: Secondary | ICD-10-CM | POA: Diagnosis not present

## 2022-04-01 ENCOUNTER — Ambulatory Visit: Payer: Medicare HMO

## 2022-04-01 ENCOUNTER — Telehealth: Payer: Self-pay | Admitting: *Deleted

## 2022-04-01 NOTE — Telephone Encounter (Signed)
Pt calls and wants to report that her dentist has her taking 500 mg Amoxicillin 3x a day. And she also has ibuprofen 600 mg every 6 hours but she has not had any of this as of now. She is having dental surgery to prepare for dentures and just wanted this to be noted in her chart. Skylee Baird Zimmerman Rumple, CMA

## 2022-04-09 DIAGNOSIS — R6889 Other general symptoms and signs: Secondary | ICD-10-CM | POA: Diagnosis not present

## 2022-04-14 ENCOUNTER — Other Ambulatory Visit: Payer: Self-pay | Admitting: Nurse Practitioner

## 2022-04-14 DIAGNOSIS — L2084 Intrinsic (allergic) eczema: Secondary | ICD-10-CM

## 2022-04-23 DIAGNOSIS — R6889 Other general symptoms and signs: Secondary | ICD-10-CM | POA: Diagnosis not present

## 2022-05-23 ENCOUNTER — Encounter: Payer: Self-pay | Admitting: Nurse Practitioner

## 2022-05-23 ENCOUNTER — Ambulatory Visit (INDEPENDENT_AMBULATORY_CARE_PROVIDER_SITE_OTHER): Payer: Medicare HMO | Admitting: Nurse Practitioner

## 2022-05-23 VITALS — BP 123/77 | HR 71 | Resp 18 | Ht 61.0 in | Wt 136.0 lb

## 2022-05-23 DIAGNOSIS — Z8673 Personal history of transient ischemic attack (TIA), and cerebral infarction without residual deficits: Secondary | ICD-10-CM

## 2022-05-23 DIAGNOSIS — F5105 Insomnia due to other mental disorder: Secondary | ICD-10-CM | POA: Diagnosis not present

## 2022-05-23 DIAGNOSIS — F409 Phobic anxiety disorder, unspecified: Secondary | ICD-10-CM

## 2022-05-23 DIAGNOSIS — R6889 Other general symptoms and signs: Secondary | ICD-10-CM | POA: Diagnosis not present

## 2022-05-23 DIAGNOSIS — Z01818 Encounter for other preprocedural examination: Secondary | ICD-10-CM | POA: Diagnosis not present

## 2022-05-23 DIAGNOSIS — I10 Essential (primary) hypertension: Secondary | ICD-10-CM | POA: Diagnosis not present

## 2022-05-23 DIAGNOSIS — F17210 Nicotine dependence, cigarettes, uncomplicated: Secondary | ICD-10-CM | POA: Diagnosis not present

## 2022-05-23 DIAGNOSIS — K029 Dental caries, unspecified: Secondary | ICD-10-CM | POA: Diagnosis not present

## 2022-05-23 DIAGNOSIS — J432 Centrilobular emphysema: Secondary | ICD-10-CM | POA: Diagnosis not present

## 2022-05-23 DIAGNOSIS — Z7901 Long term (current) use of anticoagulants: Secondary | ICD-10-CM | POA: Diagnosis not present

## 2022-05-23 MED ORDER — TRAZODONE HCL 50 MG PO TABS: 25.0000 mg | ORAL_TABLET | Freq: Every evening | ORAL | 1 refills | Status: DC | PRN

## 2022-05-23 MED ORDER — NICOTINE 21 MG/24HR TD PT24
21.0000 mg | MEDICATED_PATCH | Freq: Every day | TRANSDERMAL | 1 refills | Status: AC
Start: 2022-05-23 — End: ?

## 2022-05-23 NOTE — Progress Notes (Signed)
Established patient visit   Patient: Heidi Foster   DOB: 06-04-1955   67 y.o. Female  MRN: [id number] Visit Date: 05/23/2022   Chief Complaint  Patient presents with   Follow-up   Subjective    HPI  Follow up  -Copd --has started to smoke again.  --smoking about 10 cigarettes per day ---she is interested in using nicotine patches to help her quit smoking completely  -problems with her teeth. --has seen oral surgeon. Wants to remove her teeth.  --states that she is high risk because she is on blood thinners.  --she takes plavix.  --discussed formal surgical clearance.  ---CBC and CMP were done 12/2021 and were within normal limits.  -blood pressure well managed.    Medications: Outpatient Medications Prior to Visit  Medication Sig   albuterol (PROVENTIL HFA;VENTOLIN HFA) 108 (90 Base) MCG/ACT inhaler Inhale 2 puffs into the lungs every 4 (four) hours as needed for wheezing or shortness of breath.   atorvastatin (LIPITOR) 40 MG tablet TAKE 1 TABLET EVERY DAY   DULoxetine (CYMBALTA) 30 MG capsule TAKE 3 CAPSULES EVERY DAY   gabapentin (NEURONTIN) 300 MG capsule TAKE 3 CAPSULES AT BEDTIME (NEW DOSE)   methylPREDNISolone (MEDROL DOSEPAK) 4 MG TBPK tablet Take as directed on package.   Misc. Devices (QUAD CANE) MISC Use when walking for balance and mobility assistance   Multiple Vitamin (MULTIVITAMIN WITH MINERALS) TABS tablet Take 1 tablet daily by mouth.   Spacer/Aero-Holding Chambers (AEROCHAMBER MV) inhaler Use as instructed   triamcinolone cream (KENALOG) 0.1 % APPLY 1 APPLICATION TOPICALLY TO AFFECTED AREA(S) TWICE DAILY   umeclidinium-vilanterol (ANORO ELLIPTA) 62.5-25 MCG/INH AEPB Inhale 1 puff daily into the lungs.   [DISCONTINUED] amLODipine (NORVASC) 10 MG tablet TAKE 1 TABLET EVERY DAY   [DISCONTINUED] clopidogrel (PLAVIX) 75 MG tablet TAKE 1 TABLET EVERY DAY   [DISCONTINUED] losartan (COZAAR) 100 MG tablet TAKE 1 TABLET EVERY DAY   [DISCONTINUED] traZODone (DESYREL) 50 MG  tablet Take 0.5-1 tablets (25-50 mg total) by mouth at bedtime as needed for sleep.   No facility-administered medications prior to visit.    Review of Systems  Constitutional:  Negative for activity change, appetite change, chills, fatigue and fever.  HENT:  Positive for dental problem. Negative for congestion, postnasal drip, rhinorrhea, sinus pressure, sinus pain, sneezing and sore throat.   Eyes: Negative.   Respiratory:  Negative for cough, chest tightness, shortness of breath and wheezing.   Cardiovascular:  Negative for chest pain and palpitations.  Gastrointestinal:  Negative for abdominal pain, constipation, diarrhea, nausea and vomiting.  Endocrine: Negative for cold intolerance, heat intolerance, polydipsia and polyuria.  Genitourinary:  Negative for dyspareunia, dysuria, flank pain, frequency and urgency.  Musculoskeletal:  Negative for arthralgias, back pain and myalgias.  Skin:  Negative for rash.  Allergic/Immunologic: Negative for environmental allergies.  Neurological:  Negative for dizziness, weakness and headaches.  Hematological:  Negative for adenopathy.  Psychiatric/Behavioral:  The patient is not nervous/anxious.     Last CBC Lab Results  Component Value Date   WBC 8.5 01/15/2022   HGB 13.6 01/15/2022   HCT 41.4 01/15/2022   MCV 93 01/15/2022   MCH 30.6 01/15/2022   RDW 12.7 01/15/2022   PLT 311 [id number]   Last metabolic panel Lab Results  Component Value Date   GLUCOSE 76 01/15/2022   NA 141 01/15/2022   K 4.1 01/15/2022   CL 101 01/15/2022   CO2 23 01/15/2022   BUN 13 01/15/2022   CREATININE 0.66 01/15/2022  EGFR 97 01/15/2022   CALCIUM 10.1 01/15/2022   PHOS 3.6 12/15/2016   PROT 6.5 01/15/2022   ALBUMIN 4.6 01/15/2022   LABGLOB 1.9 01/15/2022   AGRATIO 2.4 (H) 01/15/2022   BILITOT 0.4 01/15/2022   ALKPHOS 116 01/15/2022   AST 20 01/15/2022   ALT 17 01/15/2022   ANIONGAP 6 03/17/2017   Last lipids Lab Results  Component Value Date    CHOL 159 01/15/2022   HDL 48 01/15/2022   LDLCALC 88 01/15/2022   TRIG 132 01/15/2022   CHOLHDL 3.3 01/15/2022   Last hemoglobin A1c Lab Results  Component Value Date   HGBA1C 5.9 (H) 01/15/2022   Last thyroid functions Lab Results  Component Value Date   TSH 1.040 01/15/2022   Last vitamin D Lab Results  Component Value Date   VD25OH 33.6 05/26/2016   Last vitamin B12 and Folate Lab Results  Component Value Date   VITAMINB12 479 05/26/2016       Objective     Today's Vitals   05/23/22 0916  BP: 123/77  Pulse: 71  Resp: 18  SpO2: 96%  Weight: 136 lb (61.7 kg)  Height: 5' 1"$  (1.549 m)   Body mass index is 25.7 kg/m.  BP Readings from Last 3 Encounters:  05/23/22 123/77  01/21/22 103/62  11/26/21 99/62    Wt Readings from Last 3 Encounters:  05/23/22 136 lb (61.7 kg)  01/21/22 141 lb 6.4 oz (64.1 kg)  11/26/21 139 lb (63 kg)    Physical Exam Vitals and nursing note reviewed.  Constitutional:      Appearance: Normal appearance. She is well-developed.  HENT:     Head: Normocephalic and atraumatic.     Nose: Nose normal.     Mouth/Throat:     Mouth: Mucous membranes are moist.     Pharynx: Oropharynx is clear.  Eyes:     Extraocular Movements: Extraocular movements intact.     Conjunctiva/sclera: Conjunctivae normal.     Pupils: Pupils are equal, round, and reactive to light.  Neck:     Vascular: No carotid bruit.  Cardiovascular:     Rate and Rhythm: Normal rate and regular rhythm.     Pulses: Normal pulses.     Heart sounds: Normal heart sounds.  Pulmonary:     Effort: Pulmonary effort is normal.     Breath sounds: Normal breath sounds.  Abdominal:     Palpations: Abdomen is soft.  Musculoskeletal:        General: Normal range of motion.     Cervical back: Normal range of motion and neck supple.  Lymphadenopathy:     Cervical: No cervical adenopathy.  Skin:    General: Skin is warm and dry.     Capillary Refill: Capillary refill  takes less than 2 seconds.  Neurological:     General: No focal deficit present.     Mental Status: She is alert and oriented to person, place, and time.  Psychiatric:        Mood and Affect: Mood normal.        Behavior: Behavior normal.        Thought Content: Thought content normal.        Judgment: Judgment normal.      Assessment & Plan    1. Dental caries Patient is to have removal of multiple teeth.   2. Preoperative examination She is cleared to have operative removal of multiple teeth with IV sedation. She has moderate risk due to  history of TIA/stroke. She is taking plavix. This should be stopped 7 days prior to surgery and may be restarted 24 hours after the surgical procedure.   3. H/O TIA (transient ischemic attack) and stroke Moderate surgical risk due to history of stroke/TIA. She is stable and medically optimized for surgical removal of teeth under IV sedation.   4. Anticoagulant long-term use Plavix should be stopped for 7 days prior to procedure and may be restarted 24 hours after procedure.   5. Primary hypertension Stable. Continue medication as prescribed   6. Moderate cigarette smoker (10-19 per day) Start nicoderm CQ patches. Change daily. Step down to 14 mg patches as indicated.  - nicotine (NICODERM CQ) 21 mg/24hr patch; Place 1 patch (21 mg total) onto the skin daily.  Dispense: 90 patch; Refill: 1  7. Insomnia due to anxiety and fear May take trazodone 50 mg at bedtime as needed for acute insomnia.  - traZODone (DESYREL) 50 MG tablet; Take 0.5-1 tablets (25-50 mg total) by mouth at bedtime as needed for sleep.  Dispense: 90 tablet; Refill: 1   Problem List Items Addressed This Visit       Cardiovascular and Mediastinum   Hypertension     Digestive   Dental caries - Primary     Other   H/O TIA (transient ischemic attack) and stroke   Insomnia due to anxiety and fear   Relevant Medications   traZODone (DESYREL) 50 MG tablet   Preoperative  examination   Anticoagulant long-term use   Moderate cigarette smoker (10-19 per day)   Relevant Medications   nicotine (NICODERM CQ) 21 mg/24hr patch     Return in about 3 months (around 08/22/2022) for blood pressure, smoking .         Ronnell Freshwater, NP  [facility] at [facility] [phone] (phone) [phone] (fax)  [place]

## 2022-06-10 ENCOUNTER — Other Ambulatory Visit: Payer: Self-pay

## 2022-06-10 DIAGNOSIS — I10 Essential (primary) hypertension: Secondary | ICD-10-CM

## 2022-06-10 MED ORDER — LOSARTAN POTASSIUM 100 MG PO TABS: 100.0000 mg | ORAL_TABLET | Freq: Every day | ORAL | 0 refills | Status: DC

## 2022-06-10 NOTE — Telephone Encounter (Signed)
L.O.V: 05/23/22  N.O.V: 08/22/22  L.R.F: 01/13/22 Losartan 90 tab 0 refill

## 2022-06-16 ENCOUNTER — Other Ambulatory Visit: Payer: Self-pay

## 2022-06-16 DIAGNOSIS — I10 Essential (primary) hypertension: Secondary | ICD-10-CM

## 2022-06-16 DIAGNOSIS — E7849 Other hyperlipidemia: Secondary | ICD-10-CM

## 2022-06-16 DIAGNOSIS — Z8673 Personal history of transient ischemic attack (TIA), and cerebral infarction without residual deficits: Secondary | ICD-10-CM

## 2022-06-16 MED ORDER — AMLODIPINE BESYLATE 10 MG PO TABS: 10.0000 mg | ORAL_TABLET | Freq: Every day | ORAL | 1 refills | Status: DC

## 2022-06-16 MED ORDER — CLOPIDOGREL BISULFATE 75 MG PO TABS: 75.0000 mg | ORAL_TABLET | Freq: Every day | ORAL | 0 refills | Status: DC

## 2022-06-19 DIAGNOSIS — Z01818 Encounter for other preprocedural examination: Secondary | ICD-10-CM | POA: Insufficient documentation

## 2022-06-19 DIAGNOSIS — F17210 Nicotine dependence, cigarettes, uncomplicated: Secondary | ICD-10-CM | POA: Insufficient documentation

## 2022-06-19 DIAGNOSIS — K029 Dental caries, unspecified: Secondary | ICD-10-CM | POA: Insufficient documentation

## 2022-06-19 DIAGNOSIS — Z7901 Long term (current) use of anticoagulants: Secondary | ICD-10-CM | POA: Insufficient documentation

## 2022-07-10 MED ORDER — ATORVASTATIN CALCIUM 40 MG PO TABS: 40.0000 mg | ORAL_TABLET | Freq: Every day | ORAL | 0 refills | Status: DC

## 2022-07-10 NOTE — Addendum Note (Signed)
Addended by: Gemma Payor on: 07/10/2022 12:00 PM   Modules accepted: Orders

## 2022-07-29 ENCOUNTER — Other Ambulatory Visit: Payer: Self-pay | Admitting: Nurse Practitioner

## 2022-07-29 DIAGNOSIS — L2084 Intrinsic (allergic) eczema: Secondary | ICD-10-CM

## 2022-08-04 ENCOUNTER — Telehealth: Payer: Self-pay | Admitting: *Deleted

## 2022-08-04 DIAGNOSIS — F419 Anxiety disorder, unspecified: Secondary | ICD-10-CM

## 2022-08-04 DIAGNOSIS — F321 Major depressive disorder, single episode, moderate: Secondary | ICD-10-CM

## 2022-08-04 MED ORDER — DULOXETINE HCL 30 MG PO CPEP: ORAL_CAPSULE | ORAL | 0 refills | Status: DC

## 2022-08-04 NOTE — Telephone Encounter (Signed)
Refill sent.

## 2022-08-04 NOTE — Telephone Encounter (Signed)
Pt calling requesting refill on below. She said that [facility] has tried to reach [place] and has not heard a response from [place].    DULoxetine (CYMBALTA) 30 MG capsule    [facility] - [place], [place] - [address]    LOV: 05/23/22 ROV:08/22/22

## 2022-08-22 ENCOUNTER — Encounter: Payer: Self-pay | Admitting: Nurse Practitioner

## 2022-08-22 ENCOUNTER — Ambulatory Visit (INDEPENDENT_AMBULATORY_CARE_PROVIDER_SITE_OTHER): Payer: Medicare HMO | Admitting: Nurse Practitioner

## 2022-08-22 VITALS — BP 120/66 | HR 66 | Ht 61.0 in | Wt 136.8 lb

## 2022-08-22 DIAGNOSIS — M5442 Lumbago with sciatica, left side: Secondary | ICD-10-CM | POA: Diagnosis not present

## 2022-08-22 DIAGNOSIS — M25561 Pain in right knee: Secondary | ICD-10-CM | POA: Diagnosis not present

## 2022-08-22 DIAGNOSIS — I10 Essential (primary) hypertension: Secondary | ICD-10-CM | POA: Diagnosis not present

## 2022-08-22 DIAGNOSIS — W19XXXA Unspecified fall, initial encounter: Secondary | ICD-10-CM

## 2022-08-22 DIAGNOSIS — G8929 Other chronic pain: Secondary | ICD-10-CM

## 2022-08-22 MED ORDER — TIZANIDINE HCL 4 MG PO TABS
2.0000 mg | ORAL_TABLET | Freq: Two times a day (BID) | ORAL | 0 refills | Status: AC | PRN
Start: 2022-08-22 — End: ?

## 2022-08-22 MED ORDER — METHYLPREDNISOLONE 4 MG PO TBPK
ORAL_TABLET | ORAL | 0 refills | Status: AC
Start: 2022-08-22 — End: ?

## 2022-08-22 NOTE — Progress Notes (Signed)
Established patient visit   Patient: Heidi Foster   DOB: [birth year]   67 y.o. Female  MRN: [id number] Visit Date: 08/22/2022   Chief Complaint  Patient presents with   Medical Management of Chronic Issues   Subjective    Back Pain This is a recurrent problem. The current episode started 1 to 4 weeks ago. The problem has been gradually worsening since onset. The pain is present in the lumbar spine and gluteal. The quality of the pain is described as shooting and stabbing. The pain radiates to the left thigh. The pain is moderate. The pain is The same all the time. The symptoms are aggravated by sitting. Associated symptoms include leg pain, tingling and weakness. Pertinent negatives include no bladder incontinence or bowel incontinence. Risk factors include sedentary lifestyle. She has tried analgesics, NSAIDs and bed rest for the symptoms. The treatment provided no relief.    Follow up  Blood pressure  -well managed  No other concerns or complaints --She denies chest pain, chest pressure, or shortness of breath. She denies headaches or visual disturbances. she denies abdominal pain, nausea, vomiting, or changes in bowel or bladder habits.     Medications: Outpatient Medications Prior to Visit  Medication Sig   albuterol (PROVENTIL HFA;VENTOLIN HFA) 108 (90 Base) MCG/ACT inhaler Inhale 2 puffs into the lungs every 4 (four) hours as needed for wheezing or shortness of breath.   amLODipine (NORVASC) 10 MG tablet Take 1 tablet (10 mg total) by mouth daily.   atorvastatin (LIPITOR) 40 MG tablet Take 1 tablet (40 mg total) by mouth daily.   DULoxetine (CYMBALTA) 30 MG capsule TAKE 3 CAPSULES EVERY DAY   gabapentin (NEURONTIN) 300 MG capsule TAKE 3 CAPSULES AT BEDTIME (NEW DOSE)   Misc. Devices (QUAD CANE) MISC Use when walking for balance and mobility assistance   Multiple Vitamin (MULTIVITAMIN WITH MINERALS) TABS tablet Take 1 tablet daily by mouth.   nicotine (NICODERM CQ) 21 mg/24hr patch  Place 1 patch (21 mg total) onto the skin daily.   Spacer/Aero-Holding Chambers (AEROCHAMBER MV) inhaler Use as instructed   traZODone (DESYREL) 50 MG tablet Take 0.5-1 tablets (25-50 mg total) by mouth at bedtime as needed for sleep.   triamcinolone cream (KENALOG) 0.1 % APPLY 1 APPLICATION TOPICALLY TO AFFECTED AREA(S) TWICE DAILY   umeclidinium-vilanterol (ANORO ELLIPTA) 62.5-25 MCG/INH AEPB Inhale 1 puff daily into the lungs.   [DISCONTINUED] clopidogrel (PLAVIX) 75 MG tablet Take 1 tablet (75 mg total) by mouth daily.   [DISCONTINUED] losartan (COZAAR) 100 MG tablet Take 1 tablet (100 mg total) by mouth daily.   [DISCONTINUED] methylPREDNISolone (MEDROL DOSEPAK) 4 MG TBPK tablet Take as directed on package.   No facility-administered medications prior to visit.    Review of Systems  Gastrointestinal:  Negative for bowel incontinence.  Genitourinary:  Negative for bladder incontinence.  Musculoskeletal:  Positive for back pain.  Neurological:  Positive for tingling and weakness.    Last CBC Lab Results  Component Value Date   WBC 8.5 01/15/2022   HGB 13.6 01/15/2022   HCT 41.4 01/15/2022   MCV 93 01/15/2022   MCH 30.6 01/15/2022   RDW 12.7 01/15/2022   PLT 311 01/15/2022   Last metabolic panel Lab Results  Component Value Date   GLUCOSE 76 01/15/2022   NA 141 01/15/2022   K 4.1 01/15/2022   CL 101 01/15/2022   CO2 23 01/15/2022   BUN 13 01/15/2022   CREATININE 0.66 01/15/2022   EGFR 97 01/15/2022  CALCIUM 10.1 01/15/2022   PHOS 3.6 12/15/2016   PROT 6.5 01/15/2022   ALBUMIN 4.6 01/15/2022   LABGLOB 1.9 01/15/2022   AGRATIO 2.4 (H) 01/15/2022   BILITOT 0.4 01/15/2022   ALKPHOS 116 01/15/2022   AST 20 01/15/2022   ALT 17 01/15/2022   ANIONGAP 6 03/17/2017   Last lipids Lab Results  Component Value Date   CHOL 159 01/15/2022   HDL 48 01/15/2022   LDLCALC 88 01/15/2022   TRIG 132 01/15/2022   CHOLHDL 3.3 01/15/2022   Last hemoglobin A1c Lab Results   Component Value Date   HGBA1C 5.9 (H) 01/15/2022   Last thyroid functions Lab Results  Component Value Date   TSH 1.040 01/15/2022   Last vitamin D Lab Results  Component Value Date   VD25OH 33.6 05/26/2016       Objective     Today's Vitals   08/22/22 0903  BP: 120/66  Pulse: 66  SpO2: 97%  Weight: 136 lb 12.8 oz (62.1 kg)  Height: 5\' 1"  (1.549 m)   Body mass index is 25.85 kg/m.  BP Readings from Last 3 Encounters:  08/22/22 120/66  05/23/22 123/77  01/21/22 103/62    Wt Readings from Last 3 Encounters:  08/22/22 136 lb 12.8 oz (62.1 kg)  05/23/22 136 lb (61.7 kg)  01/21/22 141 lb 6.4 oz (64.1 kg)    Physical Exam Vitals and nursing note reviewed.  Constitutional:      Appearance: Normal appearance. She is well-developed.  HENT:     Head: Normocephalic and atraumatic.     Nose: Nose normal.     Mouth/Throat:     Mouth: Mucous membranes are moist.     Pharynx: Oropharynx is clear.  Eyes:     Extraocular Movements: Extraocular movements intact.     Conjunctiva/sclera: Conjunctivae normal.     Pupils: Pupils are equal, round, and reactive to light.  Neck:     Vascular: No carotid bruit.  Cardiovascular:     Rate and Rhythm: Normal rate and regular rhythm.     Pulses: Normal pulses.     Heart sounds: Normal heart sounds.  Pulmonary:     Effort: Pulmonary effort is normal.     Breath sounds: Normal breath sounds.  Abdominal:     Palpations: Abdomen is soft.  Musculoskeletal:        General: Normal range of motion.     Cervical back: Normal range of motion and neck supple.       Back:  Lymphadenopathy:     Cervical: No cervical adenopathy.  Skin:    General: Skin is warm and dry.     Capillary Refill: Capillary refill takes less than 2 seconds.  Neurological:     General: No focal deficit present.     Mental Status: She is alert and oriented to person, place, and time.  Psychiatric:        Mood and Affect: Mood normal.        Behavior:  Behavior normal.        Thought Content: Thought content normal.        Judgment: Judgment normal.      Assessment & Plan    Fall, initial encounter Assessment & Plan: Fall at home causing acute low back pain with sciatica    Acute midline low back pain with left-sided sciatica Assessment & Plan: Fall at home causing acute lower back pain with sciatica -take tylenol as needed and as indicated for pain  -  add medrol taper - take as directed for 6 days  -may take tizanidine twice daily as needed for muscle pain and tightness. Advised she take this with caution. Tizanidine may cause dizziness and/or drowsiness.  -She voices understanding   Orders: -     methylPREDNISolone; Take as directed on package.  Dispense: 21 tablet; Refill: 0 -     tiZANidine HCl; Take 0.5-1 tablets (2-4 mg total) by mouth 2 (two) times daily as needed for muscle spasms.  Dispense: 45 tablet; Refill: 0  Chronic pain of right knee Assessment & Plan: -Apply a compressive ACE bandage.  -Rest and elevate the affected painful area.  -Apply cold compresses intermittently as needed.   -follow up with orthopedics as indicated    Primary hypertension Assessment & Plan: Stable.  -Continue current medication.       Return in about 4 months (around 12/22/2022) for mood, blood pressure, FBW a week prior to visit. needs MWV after 12/2022.Marland Kitchen         Carlean Jews, NP  [facility] at [facility] [phone] (phone) [phone] (fax)  [facility]

## 2022-08-23 ENCOUNTER — Other Ambulatory Visit: Payer: Self-pay | Admitting: Family Medicine

## 2022-08-23 DIAGNOSIS — I10 Essential (primary) hypertension: Secondary | ICD-10-CM

## 2022-08-27 ENCOUNTER — Other Ambulatory Visit: Payer: Self-pay | Admitting: Family Medicine

## 2022-08-27 DIAGNOSIS — Z8673 Personal history of transient ischemic attack (TIA), and cerebral infarction without residual deficits: Secondary | ICD-10-CM

## 2022-09-12 DIAGNOSIS — M5442 Lumbago with sciatica, left side: Secondary | ICD-10-CM | POA: Insufficient documentation

## 2022-09-12 DIAGNOSIS — W19XXXA Unspecified fall, initial encounter: Secondary | ICD-10-CM | POA: Insufficient documentation

## 2022-09-12 NOTE — Assessment & Plan Note (Signed)
Stable.  Continue current medication

## 2022-09-12 NOTE — Assessment & Plan Note (Addendum)
Fall at home causing acute lower back pain with sciatica -take tylenol as needed and as indicated for pain  -add medrol taper - take as directed for 6 days  -may take tizanidine twice daily as needed for muscle pain and tightness. Advised she take this with caution. Tizanidine may cause dizziness and/or drowsiness.  -She voices understanding

## 2022-09-12 NOTE — Assessment & Plan Note (Signed)
Fall at home causing acute low back pain with sciatica

## 2022-09-12 NOTE — Assessment & Plan Note (Addendum)
-  Apply a compressive ACE bandage.  -Rest and elevate the affected painful area.  -Apply cold compresses intermittently as needed.   -follow up with orthopedics as indicated

## 2022-09-22 ENCOUNTER — Other Ambulatory Visit: Payer: Self-pay | Admitting: Family Medicine

## 2022-09-22 DIAGNOSIS — E7849 Other hyperlipidemia: Secondary | ICD-10-CM

## 2022-09-25 NOTE — Addendum Note (Signed)
Addended by: Vincent Gros on: 09/25/2022 08:40 AM   Modules accepted: Level of Service

## 2022-10-17 ENCOUNTER — Other Ambulatory Visit: Payer: Self-pay | Admitting: Family Medicine

## 2022-10-17 DIAGNOSIS — F419 Anxiety disorder, unspecified: Secondary | ICD-10-CM

## 2022-10-17 DIAGNOSIS — F321 Major depressive disorder, single episode, moderate: Secondary | ICD-10-CM

## 2022-10-21 ENCOUNTER — Ambulatory Visit (INDEPENDENT_AMBULATORY_CARE_PROVIDER_SITE_OTHER): Payer: Medicare HMO

## 2022-10-21 ENCOUNTER — Telehealth: Payer: Self-pay | Admitting: *Deleted

## 2022-10-21 VITALS — Ht 61.0 in | Wt 136.0 lb

## 2022-10-21 DIAGNOSIS — F419 Anxiety disorder, unspecified: Secondary | ICD-10-CM

## 2022-10-21 DIAGNOSIS — F321 Major depressive disorder, single episode, moderate: Secondary | ICD-10-CM

## 2022-10-21 DIAGNOSIS — Z0000 Encounter for general adult medical examination without abnormal findings: Secondary | ICD-10-CM

## 2022-10-21 MED ORDER — DULOXETINE HCL 30 MG PO CPEP
ORAL_CAPSULE | ORAL | 0 refills | Status: AC
Start: 2022-10-21 — End: ?

## 2022-10-21 NOTE — Telephone Encounter (Signed)
Prescription Request  10/21/2022  LOV: 08/22/22 ROV:12/23/22    What is the name of the medication or equipment? DULoxetine (CYMBALTA) 30 MG capsule   Have you contacted your pharmacy to request a refill?Yes, and it was processing and was then cancelled.   Which pharmacy would you like this sent to?  [facility] - [place], [place] - [address] [place] [address] Phone: [phone] Fax: [phone]    Patient notified that their request is being sent to the clinical staff for review and that they should receive a response within 2 business days.   Please advise at [facility] [phone]

## 2022-10-21 NOTE — Progress Notes (Addendum)
Subjective:   Heidi Foster is a 67 y.o. female who presents for Medicare Annual (Subsequent) preventive examination.  Visit Complete: Virtual  I connected with  Leanor Rubenstein on 10/21/22 by a audio enabled telemedicine application and verified that I am speaking with the correct person using two identifiers.  Patient Location: Home  Provider Location: Home Office  I discussed the limitations of evaluation and management by telemedicine. The patient expressed understanding and agreed to proceed.  Patient Medicare AWV questionnaire was completed by the patient on ; I have confirmed that all information answered by patient is correct and no changes since this date.  Review of Systems     Cardiac Risk Factors include: advanced age (>19men, >63 women);hypertension;smoking/ tobacco exposure     Objective:    Today's Vitals   10/21/22 0833  Weight: 136 lb (61.7 kg)  Height: 5\' 1"  (1.549 m)   Body mass index is 25.7 kg/m.     10/21/2022    8:41 AM 03/31/2017    9:54 AM 03/15/2017   10:11 PM 03/15/2017    4:51 PM 02/09/2017    3:14 PM 11/05/2016    1:21 PM 05/26/2016   10:52 AM  Advanced Directives  Does Patient Have a Medical Advance Directive? No No No No No No No  Would patient like information on creating a medical advance directive? No - Patient declined No - Patient declined No - Patient declined   No - Patient declined Yes (MAU/Ambulatory/Procedural Areas - Information given)    Current Medications (verified) Outpatient Encounter Medications as of 10/21/2022  Medication Sig   albuterol (PROVENTIL HFA;VENTOLIN HFA) 108 (90 Base) MCG/ACT inhaler Inhale 2 puffs into the lungs every 4 (four) hours as needed for wheezing or shortness of breath.   amLODipine (NORVASC) 10 MG tablet Take 1 tablet (10 mg total) by mouth daily.   atorvastatin (LIPITOR) 40 MG tablet TAKE 1 TABLET EVERY DAY   clopidogrel (PLAVIX) 75 MG tablet TAKE 1 TABLET EVERY DAY   DULoxetine (CYMBALTA) 30 MG capsule  TAKE 3 CAPSULES EVERY DAY   gabapentin (NEURONTIN) 300 MG capsule TAKE 3 CAPSULES AT BEDTIME (NEW DOSE)   losartan (COZAAR) 100 MG tablet TAKE 1 TABLET EVERY DAY   methylPREDNISolone (MEDROL DOSEPAK) 4 MG TBPK tablet Take as directed on package.   Misc. Devices (QUAD CANE) MISC Use when walking for balance and mobility assistance   Multiple Vitamin (MULTIVITAMIN WITH MINERALS) TABS tablet Take 1 tablet daily by mouth.   nicotine (NICODERM CQ) 21 mg/24hr patch Place 1 patch (21 mg total) onto the skin daily.   Spacer/Aero-Holding Chambers (AEROCHAMBER MV) inhaler Use as instructed   tiZANidine (ZANAFLEX) 4 MG tablet Take 0.5-1 tablets (2-4 mg total) by mouth 2 (two) times daily as needed for muscle spasms.   traZODone (DESYREL) 50 MG tablet Take 0.5-1 tablets (25-50 mg total) by mouth at bedtime as needed for sleep.   triamcinolone cream (KENALOG) 0.1 % APPLY 1 APPLICATION TOPICALLY TO AFFECTED AREA(S) TWICE DAILY   umeclidinium-vilanterol (ANORO ELLIPTA) 62.5-25 MCG/INH AEPB Inhale 1 puff daily into the lungs.   No facility-administered encounter medications on file as of 10/21/2022.    Allergies (verified) Codeine and Bupropion   History: Past Medical History:  Diagnosis Date   Allergy    Anginal pain (HCC)    Arthritis    Astigmatism    Atypical mole 2018   left nose tx mohs   Cancer (HCC)    skin on nose   Closed fracture  of right patella    Emphysema of lung (HCC)    GERD (gastroesophageal reflux disease)    Heart attack (HCC) 1999   Hypertension    Osteopenia 12/2016   T score -1.2 FRAX 7.4%/0.8%   Seborrheic keratoses    Squamous cell carcinoma of skin 04/16/2021   in situ- right inner malar cheek (CX35FU)   Stroke (HCC)    TIA x 2, early 2000's    Vocal cord polyp    Past Surgical History:  Procedure Laterality Date   BREAST BIOPSY     BREAST SURGERY     Left Breast nodule markers   CARDIAC CATHETERIZATION     1999   caridac cath     LOOP RECORDER INSERTION  N/A 03/17/2017   Procedure: LOOP RECORDER INSERTION;  Surgeon: Marinus Maw, MD;  Location: [facility];  Service: Cardiovascular;  Laterality: N/A;   lt. ovary removed     MICROLARYNGOSCOPY WITH LASER Bilateral 11/13/2016   Procedure: MICROLARYNGOSCOPY;  Surgeon: Suzanna Obey, MD;  Location[facility];  Service: ENT;  Laterality: Bilateral;   polyp removal  10/2016   throat -    SKIN BIOPSY     nose   TEE WITHOUT CARDIOVERSION N/A 03/17/2017   Procedure: TRANSESOPHAGEAL ECHOCARDIOGRAM (TEE);  Surgeon: Lars Masson, MD;  Location: [facility] ENDOSCOPY;  Service: Cardiovascular;  Laterality: N/A;   TUBAL LIGATION     Family History  Problem Relation Age of Onset   Diabetes Mother    Hypertension Mother    Stroke Mother    Alcohol abuse Father    Multiple sclerosis Sister    Cancer Brother        lung   Stroke Maternal Grandmother    Heart attack Maternal Grandmother    Hypertension Maternal Grandmother    Colon cancer Neg Hx    Colon polyps Neg Hx    Esophageal cancer Neg Hx    Stomach cancer Neg Hx    Rectal cancer Neg Hx    Social History   Socioeconomic History   Marital status: Divorced    Spouse name: Not on file   Number of children: Not on file   Years of education: Not on file   Highest education level: Not on file  Occupational History   Not on file  Tobacco Use   Smoking status: Former    Packs/day: 0.50    Years: 40.00    Additional pack years: 0.00    Total pack years: 20.00    Types: Cigarettes    Start date: 09/27/1976    Quit date: 12/26/2018    Years since quitting: 3.8   Smokeless tobacco: Never   Tobacco comments:    Peak rate of 1ppd  Vaping Use   Vaping Use: Never used  Substance and Sexual Activity   Alcohol use: No   Drug use: No   Sexual activity: Never  Other Topics Concern   Not on file  Social History Narrative   [facility] (06/30/16):   Originally from [place]. Moved to [facility] in April 2016. 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 Currently lives with her daughter & son-in-law in a mobile home. Has a cat currently. No mold or TB exposure. Always had a negative skin PPD. Previously enjoyed crocheting.    Social Determinants of Health   Financial Resource Strain: Low Risk  (10/21/2022)   Overall Financial Resource Strain (CARDIA)    Difficulty  of Paying Living Expenses: Not hard at all  Food Insecurity: No Food Insecurity (10/21/2022)   Hunger Vital Sign    Worried About Running Out of Food in the Last Year: Never true    Ran Out of Food in the Last Year: Never true  Transportation Needs: No Transportation Needs (10/21/2022)   PRAPARE - Administrator, Civil Service (Medical): No    Lack of Transportation (Non-Medical): No  Physical Activity: Sufficiently Active (10/21/2022)   Exercise Vital Sign    Days of Exercise per Week: 7 days    Minutes of Exercise per Session: 40 min  Stress: No Stress Concern Present (10/21/2022)   [employer] of Occupational Health - Occupational Stress Questionnaire    Feeling of Stress : Not at all  Social Connections: Socially Isolated (10/21/2022)   Social Connection and Isolation Panel [NHANES]    Frequency of Communication with Friends and Family: More than three times a week    Frequency of Social Gatherings with Friends and Family: More than three times a week    Attends Religious Services: Never    Database administrator or Organizations: No    Attends Engineer, structural: Never    Marital Status: Divorced    Tobacco Counseling Counseling given: Yes Tobacco comments: Peak rate of 1ppd   Clinical Intake:  Pre-visit preparation completed: No  Pain : No/denies pain     BMI - recorded: 25.7 Nutritional Status: BMI 25 -29 Overweight Nutritional Risks: None Diabetes: No  How often do you need to have someone help you when you read instructions, pamphlets, or other written materials from your doctor or  pharmacy?: 1 - Never  Interpreter Needed?: No  Information entered by :: Theresa Mulligan LPN   Activities of Daily Living    10/21/2022    8:40 AM 05/23/2022    9:17 AM  In your present state of health, do you have any difficulty performing the following activities:  Hearing? 0 0  Vision? 0 0  Difficulty concentrating or making decisions? 0 0  Walking or climbing stairs? 0 1  Dressing or bathing? 0 0  Doing errands, shopping? 0 0  Preparing Food and eating ? N   Using the Toilet? N   In the past six months, have you accidently leaked urine? N   Do you have problems with loss of bowel control? N   Managing your Medications? N   Managing your Finances? N   Housekeeping or managing your Housekeeping? N     Patient Care Team: Melida Quitter, PA as PCP - General (Family Medicine) Glyn Ade, PA-C as Physician Assistant (Dermatology)  Indicate any recent Medical Services you may have received from other than [facility] providers in the past year (date may be approximate).     Assessment:   This is a routine wellness examination for Zela.  Hearing/Vision screen Hearing Screening - Comments:: Denies hearing difficulties   Vision Screening - Comments:: Wears rx glasses - up to date with routine eye exams with  [facility]  Dietary issues and exercise activities discussed:     Goals Addressed               This Visit's Progress     No current goals (pt-stated)         Depression Screen    10/21/2022    8:39 AM 08/22/2022    9:08 AM 05/23/2022    9:17 AM 01/21/2022  8:26 AM 11/26/2021    1:17 PM 07/19/2021    9:45 AM 07/05/2021    9:00 AM  PHQ 2/9 Scores  PHQ - 2 Score 0 2 1 2 2 1 2   PHQ- 9 Score 0 8 7 6 6 5 8     Fall Risk    10/21/2022    8:40 AM 05/23/2022    9:18 AM 11/26/2021    1:17 PM 07/19/2021    9:46 AM 03/05/2021    8:49 AM  Fall Risk   Falls in the past year? 1 1 1  0 0  Number falls in past yr: 0 1 1 0 0  Injury with Fall? 0 1 1 0 0  Risk  for fall due to : No Fall Risks  Impaired balance/gait;Impaired mobility  No Fall Risks  Follow up Falls prevention discussed  Falls evaluation completed Falls evaluation completed Falls evaluation completed    MEDICARE RISK AT HOME:  Medicare Risk at Home - 10/21/22 0847     Any stairs in or around the home? No    If so, are there any without handrails? No    Home free of loose throw rugs in walkways, pet beds, electrical cords, etc? Yes    Adequate lighting in your home to reduce risk of falls? Yes    Life alert? No    Use of a cane, walker or w/c? No    Grab bars in the bathroom? Yes    Shower chair or bench in shower? Yes    Elevated toilet seat or a handicapped toilet? No             TIMED UP AND GO:  Was the test performed?  No    Cognitive Function:        10/21/2022    8:41 AM 01/21/2022    8:26 AM  6CIT Screen  What Year? 0 points   What month? 0 points   What time? 0 points 0 points  Count back from 20 0 points 0 points  Months in reverse 0 points 0 points  Repeat phrase 0 points 2 points  Total Score 0 points     Immunizations Immunization History  Administered Date(s) Administered   Influenza,inj,Quad PF,6+ Mos 02/04/2017, 01/06/2019, 04/17/2020   Influenza-Unspecified 12/27/2021   PFIZER(Purple Top)SARS-COV-2 Vaccination 07/19/2019, 08/09/2019, 04/02/2020   Pfizer Covid-19 Vaccine Bivalent Booster 54yrs & up 12/27/2021   Pneumococcal Polysaccharide-23 06/25/2016   Rsv, Bivalent, Protein Subunit Rsvpref,pf Verdis Frederickson) 12/27/2021   Tdap 06/25/2016   Zoster Recombinat (Shingrix) 12/27/2021    TDAP status: Up to date  Flu Vaccine status: Up to date  Pneumococcal vaccine status: Due, Education has been provided regarding the importance of this vaccine. Advised may receive this vaccine at local pharmacy or Health Dept. Aware to provide a copy of the vaccination record if obtained from local pharmacy or Health Dept. Verbalized acceptance and  understanding.  Covid-19 vaccine status: Completed vaccines  Qualifies for Shingles Vaccine? Yes   Zostavax completed No   Shingrix Completed?: No.    Education has been provided regarding the importance of this vaccine. Patient has been advised to call insurance company to determine out of pocket expense if they have not yet received this vaccine. Advised may also receive vaccine at local pharmacy or Health Dept. Verbalized acceptance and understanding.  Screening Tests Health Maintenance  Topic Date Due   Pneumonia Vaccine 64+ Years old (2 of 2 - PCV) 06/25/2017   Zoster Vaccines- Shingrix (2 of 2)  02/21/2022   COVID-19 Vaccine (5 - 2023-24 season) 11/06/2022 (Originally 02/21/2022)   Lung Cancer Screening  08/22/2023 (Originally 09/25/2017)   MAMMOGRAM  08/22/2023 (Originally 08/14/2018)   INFLUENZA VACCINE  11/27/2022   Medicare Annual Wellness (AWV)  10/21/2023   Fecal DNA (Cologuard)  02/04/2025   DTaP/Tdap/Td (2 - Td or Tdap) 06/25/2026   DEXA SCAN  Completed   Hepatitis C Screening  Completed   HPV VACCINES  Aged Out    Health Maintenance  Health Maintenance Due  Topic Date Due   Pneumonia Vaccine 28+ Years old (2 of 2 - PCV) 06/25/2017   Zoster Vaccines- Shingrix (2 of 2) 02/21/2022    Colorectal cancer screening: Type of screening: Cologuard. Completed 02/04/22. Repeat every 3 years  Mammogram status: Ordered Deferred. Pt provided with contact info and advised to call to schedule appt.   Bone Density status: Completed 01/19/17. Results reflect: Bone density results: OSTEOPOROSIS. Repeat every   years.  Lung Cancer Screening: (Low Dose CT Chest recommended if Age 103-80 years, 20 pack-year currently smoking OR have quit w/in 15years.) does qualify.   Lung Cancer Screening Referral: Deferred  Additional Screening:  Hepatitis C Screening: does qualify; Completed 06/25/16  Vision Screening: Recommended annual ophthalmology exams for early detection of glaucoma and  other disorders of the eye. Is the patient up to date with their annual eye exam?  Yes  Who is the provider or what is the name of the office in which the patient attends annual eye exams? [facility] If pt is not established with a provider, would they like to be referred to a provider to establish care? No .   Dental Screening: Recommended annual dental exams for proper oral hygiene   Community Resource Referral / Chronic Care Management:  CRR required this visit?  No   CCM required this visit?  No     Plan:     I have personally reviewed and noted the following in the patient's chart:   Medical and social history Use of alcohol, tobacco or illicit drugs  Current medications and supplements including opioid prescriptions. Patient is not currently taking opioid prescriptions. Functional ability and status Nutritional status Physical activity Advanced directives List of other physicians Hospitalizations, surgeries, and ER visits in previous 12 months Vitals Screenings to include cognitive, depression, and falls Referrals and appointments  In addition, I have reviewed and discussed with patient certain preventive protocols, quality metrics, and best practice recommendations. A written personalized care plan for preventive services as well as general preventive health recommendations were provided to patient.     Tillie Rung, LPN   1/61/0960   After Visit Summary: (MyChart) Due to this being a telephonic visit, the after visit summary with patients personalized plan was offered to patient via MyChart   Nurse Notes: None

## 2022-10-21 NOTE — Telephone Encounter (Signed)
Refill sent.

## 2022-10-21 NOTE — Patient Instructions (Addendum)
Heidi Foster , Thank you for taking time to come for your Medicare Wellness Visit. I appreciate your ongoing commitment to your health goals. Please review the following plan we discussed and let me know if I can assist you in the future.   These are the goals we discussed:  Goals       No current goals (pt-stated)        This is a list of the screening recommended for you and due dates:  Health Maintenance  Topic Date Due   Pneumonia Vaccine (2 of 2 - PCV) 06/25/2017   Zoster (Shingles) Vaccine (2 of 2) 02/21/2022   COVID-19 Vaccine (5 - 2023-24 season) 11/06/2022*   Screening for Lung Cancer  08/22/2023*   Mammogram  08/22/2023*   Flu Shot  11/27/2022   Medicare Annual Wellness Visit  10/21/2023   Cologuard (Stool DNA test)  02/04/2025   DTaP/Tdap/Td vaccine (2 - Td or Tdap) 06/25/2026   DEXA scan (bone density measurement)  Completed   Hepatitis C Screening  Completed   HPV Vaccine  Aged Out  *Topic was postponed. The date shown is not the original due date.    Advanced directives: Advance directive discussed with you today. Even though you declined this today, please call our office should you change your mind, and we can give you the proper paperwork for you to fill out.   Conditions/risks identified: None  Next appointment: Follow up in one year for your annual wellness visit    Preventive Care 65 Years and Older, Female Preventive care refers to lifestyle choices and visits with your health care provider that can promote health and wellness. What does preventive care include? A yearly physical exam. This is also called an annual well check. Dental exams once or twice a year. Routine eye exams. Ask your health care provider how often you should have your eyes checked. Personal lifestyle choices, including: Daily care of your teeth and gums. Regular physical activity. Eating a healthy diet. Avoiding tobacco and drug use. Limiting alcohol use. Practicing safe  sex. Taking low-dose aspirin every day. Taking vitamin and mineral supplements as recommended by your health care provider. What happens during an annual well check? The services and screenings done by your health care provider during your annual well check will depend on your age, overall health, lifestyle risk factors, and family history of disease. Counseling  Your health care provider may ask you questions about your: Alcohol use. Tobacco use. Drug use. Emotional well-being. Home and relationship well-being. Sexual activity. Eating habits. History of falls. Memory and ability to understand (cognition). Work and work Astronomer. Reproductive health. Screening  You may have the following tests or measurements: Height, weight, and BMI. Blood pressure. Lipid and cholesterol levels. These may be checked every 5 years, or more frequently if you are over 36 years old. Skin check. Lung cancer screening. You may have this screening every year starting at age 58 if you have a 30-pack-year history of smoking and currently smoke or have quit within the past 15 years. Fecal occult blood test (FOBT) of the stool. You may have this test every year starting at age 39. Flexible sigmoidoscopy or colonoscopy. You may have a sigmoidoscopy every 5 years or a colonoscopy every 10 years starting at age 10. Hepatitis C blood test. Hepatitis B blood test. Sexually transmitted disease (STD) testing. Diabetes screening. This is done by checking your blood sugar (glucose) after you have not eaten for a while (fasting). You may  have this done every 1-3 years. Bone density scan. This is done to screen for osteoporosis. You may have this done starting at age 69. Mammogram. This may be done every 1-2 years. Talk to your health care provider about how often you should have regular mammograms. Talk with your health care provider about your test results, treatment options, and if necessary, the need for more  tests. Vaccines  Your health care provider may recommend certain vaccines, such as: Influenza vaccine. This is recommended every year. Tetanus, diphtheria, and acellular pertussis (Tdap, Td) vaccine. You may need a Td booster every 10 years. Zoster vaccine. You may need this after age 63. Pneumococcal 13-valent conjugate (PCV13) vaccine. One dose is recommended after age 12. Pneumococcal polysaccharide (PPSV23) vaccine. One dose is recommended after age 50. Talk to your health care provider about which screenings and vaccines you need and how often you need them. This information is not intended to replace advice given to you by your health care provider. Make sure you discuss any questions you have with your health care provider. Document Released: 05/11/2015 Document Revised: 01/02/2016 Document Reviewed: 02/13/2015 Elsevier Interactive Patient Education  2017 [employer] Prevention in the Home Falls can cause injuries. They can happen to people of all ages. There are many things you can do to make your home safe and to help prevent falls. What can I do on the outside of my home? Regularly fix the edges of walkways and driveways and fix any cracks. Remove anything that might make you trip as you walk through a door, such as a raised step or threshold. Trim any bushes or trees on the path to your home. Use bright outdoor lighting. Clear any walking paths of anything that might make someone trip, such as rocks or tools. Regularly check to see if handrails are loose or broken. Make sure that both sides of any steps have handrails. Any raised decks and porches should have guardrails on the edges. Have any leaves, snow, or ice cleared regularly. Use sand or salt on walking paths during winter. Clean up any spills in your garage right away. This includes oil or grease spills. What can I do in the bathroom? Use night lights. Install grab bars by the toilet and in the tub and shower.  Do not use towel bars as grab bars. Use non-skid mats or decals in the tub or shower. If you need to sit down in the shower, use a plastic, non-slip stool. Keep the floor dry. Clean up any water that spills on the floor as soon as it happens. Remove soap buildup in the tub or shower regularly. Attach bath mats securely with double-sided non-slip rug tape. Do not have throw rugs and other things on the floor that can make you trip. What can I do in the bedroom? Use night lights. Make sure that you have a light by your bed that is easy to reach. Do not use any sheets or blankets that are too big for your bed. They should not hang down onto the floor. Have a firm chair that has side arms. You can use this for support while you get dressed. Do not have throw rugs and other things on the floor that can make you trip. What can I do in the kitchen? Clean up any spills right away. Avoid walking on wet floors. Keep items that you use a lot in easy-to-reach places. If you need to reach something above you, use a strong step  stool that has a grab bar. Keep electrical cords out of the way. Do not use floor polish or wax that makes floors slippery. If you must use wax, use non-skid floor wax. Do not have throw rugs and other things on the floor that can make you trip. What can I do with my stairs? Do not leave any items on the stairs. Make sure that there are handrails on both sides of the stairs and use them. Fix handrails that are broken or loose. Make sure that handrails are as long as the stairways. Check any carpeting to make sure that it is firmly attached to the stairs. Fix any carpet that is loose or worn. Avoid having throw rugs at the top or bottom of the stairs. If you do have throw rugs, attach them to the floor with carpet tape. Make sure that you have a light switch at the top of the stairs and the bottom of the stairs. If you do not have them, ask someone to add them for you. What else  can I do to help prevent falls? Wear shoes that: Do not have high heels. Have rubber bottoms. Are comfortable and fit you well. Are closed at the toe. Do not wear sandals. If you use a stepladder: Make sure that it is fully opened. Do not climb a closed stepladder. Make sure that both sides of the stepladder are locked into place. Ask someone to hold it for you, if possible. Clearly mark and make sure that you can see: Any grab bars or handrails. First and last steps. Where the edge of each step is. Use tools that help you move around (mobility aids) if they are needed. These include: Canes. Walkers. Scooters. Crutches. Turn on the lights when you go into a dark area. Replace any light bulbs as soon as they burn out. Set up your furniture so you have a clear path. Avoid moving your furniture around. If any of your floors are uneven, fix them. If there are any pets around you, be aware of where they are. Review your medicines with your doctor. Some medicines can make you feel dizzy. This can increase your chance of falling. Ask your doctor what other things that you can do to help prevent falls. This information is not intended to replace advice given to you by your health care provider. Make sure you discuss any questions you have with your health care provider. Document Released: 02/08/2009 Document Revised: 09/20/2015 Document Reviewed: 05/19/2014 Elsevier Interactive Patient Education  2017 [employer].

## 2022-11-06 ENCOUNTER — Other Ambulatory Visit: Payer: Self-pay | Admitting: Family Medicine

## 2022-11-06 ENCOUNTER — Other Ambulatory Visit: Payer: Self-pay | Admitting: Nurse Practitioner

## 2022-11-06 DIAGNOSIS — H5203 Hypermetropia, bilateral: Secondary | ICD-10-CM | POA: Diagnosis not present

## 2022-11-06 DIAGNOSIS — H524 Presbyopia: Secondary | ICD-10-CM | POA: Diagnosis not present

## 2022-11-06 DIAGNOSIS — I10 Essential (primary) hypertension: Secondary | ICD-10-CM

## 2022-11-06 DIAGNOSIS — F409 Phobic anxiety disorder, unspecified: Secondary | ICD-10-CM

## 2022-11-06 DIAGNOSIS — Z8673 Personal history of transient ischemic attack (TIA), and cerebral infarction without residual deficits: Secondary | ICD-10-CM

## 2022-12-23 ENCOUNTER — Ambulatory Visit: Payer: Medicare HMO | Admitting: Family Medicine

## 2023-06-04 ENCOUNTER — Encounter: Payer: Self-pay | Admitting: Family Medicine

## 2023-08-17 ENCOUNTER — Other Ambulatory Visit: Payer: Self-pay | Admitting: Family Medicine

## 2023-08-17 DIAGNOSIS — L2084 Intrinsic (allergic) eczema: Secondary | ICD-10-CM

## 2023-08-18 ENCOUNTER — Telehealth: Payer: Self-pay | Admitting: *Deleted

## 2023-08-18 NOTE — Telephone Encounter (Signed)
 Received a refill request and I contacted pt to see about scheduling an appointment.  Looks like her las appt was 08/22/22, and she was to follow up in 12/22/22. Please assist her in getting an appt with Dr. Arabella Beach if she calls back.

## 2023-09-05 ENCOUNTER — Other Ambulatory Visit: Payer: Self-pay | Admitting: Family Medicine

## 2023-09-05 DIAGNOSIS — L2084 Intrinsic (allergic) eczema: Secondary | ICD-10-CM

## 2024-01-06 ENCOUNTER — Other Ambulatory Visit: Payer: Self-pay | Admitting: Family Medicine

## 2024-01-06 DIAGNOSIS — Z8673 Personal history of transient ischemic attack (TIA), and cerebral infarction without residual deficits: Secondary | ICD-10-CM
